# Patient Record
Sex: Male | Born: 1950 | Race: Black or African American | Hispanic: No | Smoking: Never smoker
Health system: Southern US, Community
[De-identification: ages and names within clinical notes are randomized; demographics above are authoritative.]

## PROBLEM LIST (undated history)

## (undated) DIAGNOSIS — H269 Unspecified cataract: Secondary | ICD-10-CM

## (undated) DIAGNOSIS — A048 Other specified bacterial intestinal infections: Secondary | ICD-10-CM

## (undated) DIAGNOSIS — H35039 Hypertensive retinopathy, unspecified eye: Secondary | ICD-10-CM

## (undated) DIAGNOSIS — K219 Gastro-esophageal reflux disease without esophagitis: Secondary | ICD-10-CM

## (undated) DIAGNOSIS — D509 Iron deficiency anemia, unspecified: Secondary | ICD-10-CM

## (undated) DIAGNOSIS — J449 Chronic obstructive pulmonary disease, unspecified: Secondary | ICD-10-CM

## (undated) DIAGNOSIS — I10 Essential (primary) hypertension: Secondary | ICD-10-CM

## (undated) DIAGNOSIS — F1990 Other psychoactive substance use, unspecified, uncomplicated: Secondary | ICD-10-CM

## (undated) DIAGNOSIS — D573 Sickle-cell trait: Secondary | ICD-10-CM

## (undated) DIAGNOSIS — D126 Benign neoplasm of colon, unspecified: Secondary | ICD-10-CM

## (undated) DIAGNOSIS — Z87442 Personal history of urinary calculi: Secondary | ICD-10-CM

## (undated) HISTORY — PX: ADENOIDECTOMY: SUR15

## (undated) HISTORY — DX: Hypertensive retinopathy, unspecified eye: H35.039

## (undated) HISTORY — DX: Essential (primary) hypertension: I10

## (undated) HISTORY — DX: Sickle-cell trait: D57.3

## (undated) HISTORY — DX: Benign neoplasm of colon, unspecified: D12.6

## (undated) HISTORY — DX: Other psychoactive substance use, unspecified, uncomplicated: F19.90

## (undated) HISTORY — PX: TONSILLECTOMY: SUR1361

## (undated) HISTORY — DX: Unspecified cataract: H26.9

## (undated) HISTORY — PX: CIRCUMCISION: SUR203

## (undated) HISTORY — DX: Iron deficiency anemia, unspecified: D50.9

## (undated) HISTORY — DX: Other specified bacterial intestinal infections: A04.8

---

## 2007-07-21 DIAGNOSIS — I10 Essential (primary) hypertension: Secondary | ICD-10-CM

## 2007-07-21 HISTORY — DX: Essential (primary) hypertension: I10

## 2007-10-25 ENCOUNTER — Encounter (INDEPENDENT_AMBULATORY_CARE_PROVIDER_SITE_OTHER): Payer: Self-pay | Admitting: *Deleted

## 2007-10-25 ENCOUNTER — Ambulatory Visit: Payer: Self-pay | Admitting: Family Medicine

## 2007-10-25 LAB — CONVERTED CEMR LAB
PSA: 0.59 ng/mL
PSA: 0.59 ng/mL

## 2007-10-31 ENCOUNTER — Encounter (INDEPENDENT_AMBULATORY_CARE_PROVIDER_SITE_OTHER): Payer: Self-pay | Admitting: *Deleted

## 2007-10-31 DIAGNOSIS — D573 Sickle-cell trait: Secondary | ICD-10-CM | POA: Insufficient documentation

## 2007-10-31 DIAGNOSIS — I10 Essential (primary) hypertension: Secondary | ICD-10-CM | POA: Insufficient documentation

## 2008-05-08 ENCOUNTER — Ambulatory Visit: Payer: Self-pay | Admitting: Family Medicine

## 2008-08-28 ENCOUNTER — Telehealth: Payer: Self-pay | Admitting: Family Medicine

## 2008-11-02 ENCOUNTER — Encounter: Payer: Self-pay | Admitting: Family Medicine

## 2008-11-05 LAB — CONVERTED CEMR LAB
BUN: 18 mg/dL (ref 6–23)
Basophils Absolute: 0 10*3/uL (ref 0.0–0.1)
Basophils Relative: 0 % (ref 0–1)
CO2: 23 meq/L (ref 19–32)
Calcium: 8.4 mg/dL (ref 8.4–10.5)
Chloride: 111 meq/L (ref 96–112)
Cholesterol: 113 mg/dL (ref 0–200)
Creatinine, Ser: 1.39 mg/dL (ref 0.40–1.50)
Eosinophils Absolute: 0.2 10*3/uL (ref 0.0–0.7)
Eosinophils Relative: 3 % (ref 0–5)
Glucose, Bld: 100 mg/dL — ABNORMAL HIGH (ref 70–99)
HCT: 33.5 % — ABNORMAL LOW (ref 39.0–52.0)
HDL: 40 mg/dL (ref >39)
Hemoglobin: 11.3 g/dL — ABNORMAL LOW (ref 13.0–17.0)
LDL Cholesterol: 58 mg/dL (ref 0–99)
Lymphocytes Relative: 24 % (ref 12–46)
Lymphs Abs: 1.6 10*3/uL (ref 0.7–4.0)
MCHC: 33.7 g/dL (ref 30.0–36.0)
MCV: 95.4 fL (ref 78.0–100.0)
Monocytes Absolute: 0.8 10*3/uL (ref 0.1–1.0)
Monocytes Relative: 13 % — ABNORMAL HIGH (ref 3–12)
Neutro Abs: 3.9 10*3/uL (ref 1.7–7.7)
Neutrophils Relative %: 60 % (ref 43–77)
PSA: 0.59 ng/mL (ref 0.10–4.00)
Platelets: 180 10*3/uL (ref 150–400)
Potassium: 3.6 meq/L (ref 3.5–5.3)
RBC: 3.51 M/uL — ABNORMAL LOW (ref 4.22–5.81)
RDW: 13.1 % (ref 11.5–15.5)
Sodium: 144 meq/L (ref 135–145)
Total CHOL/HDL Ratio: 2.8
Triglycerides: 76 mg/dL (ref <150)
VLDL: 15 mg/dL (ref 0–40)
WBC: 6.6 10*3/uL (ref 4.0–10.5)

## 2008-12-03 ENCOUNTER — Ambulatory Visit: Payer: Self-pay | Admitting: Family Medicine

## 2008-12-03 DIAGNOSIS — K921 Melena: Secondary | ICD-10-CM

## 2008-12-03 DIAGNOSIS — D649 Anemia, unspecified: Secondary | ICD-10-CM

## 2009-01-22 ENCOUNTER — Telehealth: Payer: Self-pay | Admitting: Family Medicine

## 2009-01-23 ENCOUNTER — Encounter (INDEPENDENT_AMBULATORY_CARE_PROVIDER_SITE_OTHER): Payer: Self-pay | Admitting: *Deleted

## 2009-02-17 HISTORY — PX: COLONOSCOPY: SHX174

## 2009-03-13 ENCOUNTER — Ambulatory Visit: Payer: Self-pay | Admitting: Gastroenterology

## 2009-03-13 DIAGNOSIS — K625 Hemorrhage of anus and rectum: Secondary | ICD-10-CM | POA: Insufficient documentation

## 2009-03-18 LAB — CONVERTED CEMR LAB
Basophils Absolute: 0 10*3/uL (ref 0.0–0.1)
Basophils Relative: 1 % (ref 0–1)
Eosinophils Absolute: 0.1 10*3/uL (ref 0.0–0.7)
Eosinophils Relative: 2 % (ref 0–5)
Ferritin: 13 ng/mL — ABNORMAL LOW (ref 22–322)
HCT: 34.5 % — ABNORMAL LOW (ref 39.0–52.0)
Hemoglobin: 11.2 g/dL — ABNORMAL LOW (ref 13.0–17.0)
Lymphocytes Relative: 37 % (ref 12–46)
Lymphs Abs: 2 10*3/uL (ref 0.7–4.0)
MCHC: 32.5 g/dL (ref 30.0–36.0)
MCV: 97.5 fL (ref 78.0–100.0)
Monocytes Absolute: 0.4 10*3/uL (ref 0.1–1.0)
Monocytes Relative: 8 % (ref 3–12)
Neutro Abs: 2.8 10*3/uL (ref 1.7–7.7)
Neutrophils Relative %: 52 % (ref 43–77)
Platelets: 178 10*3/uL (ref 150–400)
RBC: 3.54 M/uL — ABNORMAL LOW (ref 4.22–5.81)
RDW: 13.3 % (ref 11.5–15.5)
WBC: 5.3 10*3/uL (ref 4.0–10.5)

## 2009-03-19 ENCOUNTER — Ambulatory Visit: Payer: Self-pay | Admitting: Gastroenterology

## 2009-03-19 ENCOUNTER — Encounter: Payer: Self-pay | Admitting: Gastroenterology

## 2009-03-19 ENCOUNTER — Encounter: Payer: Self-pay | Admitting: Family Medicine

## 2009-03-19 ENCOUNTER — Ambulatory Visit (HOSPITAL_COMMUNITY): Admission: RE | Admit: 2009-03-19 | Discharge: 2009-03-19 | Payer: Self-pay | Admitting: Gastroenterology

## 2009-05-15 ENCOUNTER — Ambulatory Visit: Payer: Self-pay | Admitting: Gastroenterology

## 2009-05-15 DIAGNOSIS — D126 Benign neoplasm of colon, unspecified: Secondary | ICD-10-CM | POA: Insufficient documentation

## 2009-05-17 DIAGNOSIS — D509 Iron deficiency anemia, unspecified: Secondary | ICD-10-CM

## 2009-05-29 ENCOUNTER — Encounter: Payer: Self-pay | Admitting: Gastroenterology

## 2009-07-29 ENCOUNTER — Ambulatory Visit: Payer: Self-pay | Admitting: Family Medicine

## 2009-08-20 ENCOUNTER — Ambulatory Visit: Payer: Self-pay | Admitting: Gastroenterology

## 2009-08-20 DIAGNOSIS — A048 Other specified bacterial intestinal infections: Secondary | ICD-10-CM

## 2009-08-20 HISTORY — PX: UPPER GASTROINTESTINAL ENDOSCOPY: SHX188

## 2009-08-20 HISTORY — DX: Other specified bacterial intestinal infections: A04.8

## 2009-08-22 ENCOUNTER — Encounter: Payer: Self-pay | Admitting: Gastroenterology

## 2009-08-26 ENCOUNTER — Encounter: Payer: Self-pay | Admitting: Gastroenterology

## 2009-08-26 LAB — CONVERTED CEMR LAB
Ferritin: 15 ng/mL — ABNORMAL LOW (ref 22–322)
HCT: 33.4 % — ABNORMAL LOW (ref 39.0–52.0)
Hemoglobin: 11.3 g/dL — ABNORMAL LOW (ref 13.0–17.0)

## 2009-09-05 ENCOUNTER — Ambulatory Visit: Payer: Self-pay | Admitting: Gastroenterology

## 2009-09-05 ENCOUNTER — Ambulatory Visit (HOSPITAL_COMMUNITY): Admission: RE | Admit: 2009-09-05 | Discharge: 2009-09-05 | Payer: Self-pay | Admitting: Gastroenterology

## 2010-01-21 ENCOUNTER — Ambulatory Visit: Payer: Self-pay | Admitting: Gastroenterology

## 2010-01-21 DIAGNOSIS — K2970 Gastritis, unspecified, without bleeding: Secondary | ICD-10-CM

## 2010-01-21 DIAGNOSIS — B9681 Helicobacter pylori [H. pylori] as the cause of diseases classified elsewhere: Secondary | ICD-10-CM | POA: Insufficient documentation

## 2010-01-22 ENCOUNTER — Ambulatory Visit: Payer: Self-pay | Admitting: Family Medicine

## 2010-01-23 ENCOUNTER — Encounter: Payer: Self-pay | Admitting: Family Medicine

## 2010-01-25 ENCOUNTER — Encounter: Payer: Self-pay | Admitting: Gastroenterology

## 2010-02-10 ENCOUNTER — Encounter: Payer: Self-pay | Admitting: Gastroenterology

## 2010-02-10 LAB — CONVERTED CEMR LAB
BUN: 18 mg/dL (ref 6–23)
CO2: 26 meq/L (ref 19–32)
Chloride: 107 meq/L (ref 96–112)
Creatinine, Ser: 1.44 mg/dL (ref 0.40–1.50)
Ferritin: 43 ng/mL (ref 22–322)
Glucose, Bld: 96 mg/dL (ref 70–99)
LDL Cholesterol: 67 mg/dL (ref 0–99)
PSA: 0.69 ng/mL (ref 0.10–4.00)
Potassium: 3.8 meq/L (ref 3.5–5.3)
TSH: 1.629 microintl units/mL (ref 0.350–4.500)
VLDL: 13 mg/dL (ref 0–40)

## 2010-05-07 ENCOUNTER — Encounter (INDEPENDENT_AMBULATORY_CARE_PROVIDER_SITE_OTHER): Payer: Self-pay

## 2010-05-09 ENCOUNTER — Encounter: Payer: Self-pay | Admitting: Gastroenterology

## 2010-06-10 ENCOUNTER — Encounter: Payer: Self-pay | Admitting: Gastroenterology

## 2010-06-16 ENCOUNTER — Ambulatory Visit: Payer: Self-pay | Admitting: Gastroenterology

## 2010-06-17 ENCOUNTER — Encounter: Payer: Self-pay | Admitting: Gastroenterology

## 2010-07-17 ENCOUNTER — Ambulatory Visit: Payer: Self-pay | Admitting: Family Medicine

## 2010-07-18 DIAGNOSIS — M542 Cervicalgia: Secondary | ICD-10-CM | POA: Insufficient documentation

## 2010-07-24 ENCOUNTER — Encounter: Payer: Self-pay | Admitting: Gastroenterology

## 2010-08-05 ENCOUNTER — Encounter: Payer: Self-pay | Admitting: Family Medicine

## 2010-08-05 LAB — CONVERTED CEMR LAB
BUN: 17 mg/dL (ref 6–23)
CO2: 31 meq/L (ref 19–32)
Calcium: 8.7 mg/dL (ref 8.4–10.5)
Creatinine, Ser: 1.37 mg/dL (ref 0.40–1.50)
Glucose, Bld: 102 mg/dL — ABNORMAL HIGH (ref 70–99)
Sodium: 143 meq/L (ref 135–145)

## 2010-08-19 NOTE — Assessment & Plan Note (Signed)
Summary: hospital follow up - cdg   Visit Type:  f/u Primary Care Provider:  Lodema Hong  Chief Complaint:  f/u anemia.  History of Present Illness: Patient is here for f/u. Denies abd pain, constipation, diarrhea, n/v, heartburn, melena, brbpr. He feels well. He is due for H/H, ferritin. He completed treatment for H.Pylori back in 2/11.   Current Medications (verified): 1)  Amlodipine Besylate 10 Mg  Tabs (Amlodipine Besylate) .... One Tab By Mouth Once Daily 2)  Maxzide-25 37.5-25 Mg  Tabs (Triamterene-Hctz) .... One Tab By Mouth Once Daily 3)  Mvi With Iron .... One By Mouth Every Other Day 4)  Omeprazole 20 Mg Cpdr (Omeprazole) .... 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 He had multiple erosions which had evidence of active oozing. Biopsies - H. pylori gastritis. Patchy erythema in the duodenal bulb and second portion of the duodenum.  Review of Systems      See HPI  Vital Signs:  Patient profile:   60 year old male Height:      73 inches Weight:      170 pounds BMI:     22.51 Temp:     97.6 degrees F oral Pulse rate:   60 / minute BP sitting:   108 / 72  (left arm) Cuff size:   regular  Vitals Entered By: Hendricks Limes LPN (January 21, 1609 1:13 PM)  Physical Exam  General:  Well developed, well nourished, no acute distress. Eyes:  sclera nonicteric Mouth:  op moist Abdomen:  Bowel sounds normal.  Abdomen is soft, nontender, nondistended.  No rebound or guarding.  No hepatosplenomegaly, masses or hernias.  No abdominal bruits.  Extremities:  No clubbing, cyanosis, edema or deformities noted. Neurologic:  Alert and  oriented x4;  grossly normal neurologically. Skin:  Intact without significant  lesions or rashes. Psych:  Alert and cooperative. Normal mood and affect.  Impression & Recommendations:  Problem # 1:  ANEMIA, IRON DEFICIENCY (ICD-280.9) Feels well. Due for labs. Hopefully will see improvement in numbers now that H.Pylori gastritis treated. Orders: T-Ferritin ([id number]) T-Hemoglobin and Hematocrit (1005) Est. Patient Level II (19147)

## 2010-08-19 NOTE — Assessment & Plan Note (Signed)
Summary: DROPPED OFF STOOL/SS  pt returned ifobt and it was negative  Allergies: No Known Drug Allergies  Other Orders: Immuno-chemical Fecal Occult (52841)  Appended Document: DROPPED OFF STOOL/SS heme neg h/h, ferritin in 4 months  Appended Document: DROPPED OFF STOOL/SS LMOM to call. Lab order on file for 10/16/2010.  Appended Document: DROPPED OFF STOOL/SS Pt informed.

## 2010-08-19 NOTE — Assessment & Plan Note (Signed)
Summary: FU OV IN 3 MONTHS,ANEMIA,HEME + STOOLS/SS   Visit Type:  Follow-up Visit Primary Care Provider:  Lodema Hong  Chief Complaint:  F/U anemia.  History of Present Illness: [name] is here for 3 month f/u. Appetite good. No abd pain, n/v, gerd, constipation, diarrhea, melena, brbpr. C/O nausea and gi upset on Nu-iron. Stopped after three days. Taking "stress" vitamin with iron supplement.    Current Medications (verified): 1)  Amlodipine Besylate 10 Mg  Tabs (Amlodipine Besylate) .... One Tab By Mouth Once Daily 2)  Maxzide-25 37.5-25 Mg  Tabs (Triamterene-Hctz) .... One Tab By Mouth Once Daily 3)  Mvi With Iron .... One By Mouth Every Other Day  Allergies (verified): No Known Drug Allergies  Review of Systems      See HPI  Vital Signs:  Patient profile:   60 year old male Height:      73 inches Weight:      179 pounds BMI:     23.70 Temp:     98.3 degrees F oral Pulse rate:   56 / minute BP sitting:   110 / 70  (left arm) Cuff size:   regular  Vitals Entered By: Cloria Spring LPN (August 20, 2009 1:15 PM)  Physical Exam  General:  Well developed, well nourished, no acute distress. Head:  Normocephalic and atraumatic. Eyes:  Sclera nonicteric. Mouth:  OP moist. Lungs:  Clear throughout to auscultation. Heart:  Regular rate and rhythm; no murmurs, rubs,  or bruits. Abdomen:  Bowel sounds normal.  Abdomen is soft, nontender, nondistended.  No rebound or guarding.  No hepatosplenomegaly, masses or hernias.  No abdominal bruits.  Extremities:  No clubbing, cyanosis, edema or deformities noted. Neurologic:  Alert and  oriented x4;  grossly normal neurologically. Skin:  Intact without significant lesions or rashes. Psych:  Alert and cooperative. Normal mood and affect.  Impression & Recommendations:  Problem # 1:  ANEMIA, IRON DEFICIENCY (ICD-280.9) Due for three month labs. Denies overt GI bleeding. Further recommendation to follow labs.  Orders: T-Ferritin  ([id number]) T-Hemoglobin and Hematocrit (1005) Est. Patient Level II (91478)  Problem # 2:  TUBULOVILLOUS ADENOMA, COLON (ICD-211.3)  Surveillance TCS 02/2012.  Orders: Est. Patient Level II (29562)

## 2010-08-19 NOTE — Miscellaneous (Signed)
Summary: Orders Update  Clinical Lists Changes  Orders: Added new Test order of T-Hemoglobin and Hematocrit (1005) - Signed Added new Test order of T-Ferritin [phone]) - Signed

## 2010-08-19 NOTE — Assessment & Plan Note (Signed)
Summary: OV   Vital Signs:  Patient profile:   60 year old male Height:      72 inches Weight:      178 pounds BMI:     24.23 O2 Sat:      99 % Pulse rate:   67 / minute Pulse rhythm:   regular Resp:     16 per minute BP sitting:   120 / 78  (left arm) Cuff size:   regular  Vitals Entered By: Everitt Amber (July 29, 2009 3:34 PM) CC: Follow up chronic problems, states he blacked out about 3 months ago from where he got too hot on the job Is Patient Diabetic? No   Primary Care Provider:  Lodema Hong, M.D.  CC:  Follow up chronic problems and states he blacked out about 3 months ago from where he got too hot on the job.  History of Present Illness: Reports  that he has been are doing well. Denies recent fever or chills. Denies sinus pressure, nasal congestion , ear pain or sore throat. Denies chest congestion, or cough productive of sputum. Denies chest pain, palpitations, PND, orthopnea or leg swelling. Denies abdominal pain, nausea, vomitting, diarrhea or constipation. Denies change in bowel movements or bloody stool. Denies dysuria , frequency, incontinence or hesitancy. Denies  joint swelling,  Denies headaches, vertigo, seizures. Denies depression, anxiety or insomnia. Denies  rash, lesions, or itch. Has been diagnosed with colon polyps and needs rept colonscopy in2013, Dr. Darrick Penna is following his HB , states he stopped the iron since it made him feelbadly.     Current Medications (verified): 1)  Amlodipine Besylate 10 Mg  Tabs (Amlodipine Besylate) .... One Tab By Mouth Once Daily 2)  Maxzide-25 37.5-25 Mg  Tabs (Triamterene-Hctz) .... One Tab By Mouth Once Daily  Allergies (verified): No Known Drug Allergies  Review of Systems      See HPI Eyes:  Denies blurring and discharge. CV:  Complains of near fainting; pt reports tha tin October  he had a syncopal event on the job, witnessed by coworkers in [place], when they got ready to call 911 he told them he was ok,  less thn 2 mins. MS:  Complains of joint pain and stiffness; left neck stiffness persits following a fall approx 2 years ago, this was thefirst time he found out his BP was high. Neuro:  Denies headaches, seizures, and sensation of room spinning. Heme:  Denies abnormal bruising and bleeding. Allergy:  Denies hives or rash and sneezing.  Physical Exam  General:  Well-developed,well-nourished,in no acute distress; alert,appropriate and cooperative throughout examination HEENT: No facial asymmetry,  EOMI, No sinus tenderness, TM's Clear, oropharynx  pink and moist. decreased rOM neck with left trapezius spasm  Chest: Clear to auscultation bilaterally.  CVS: S1, S2, No murmurs, No S3.   Abd: Soft, Nontender.  MS: Adequate ROM spine, hips, shoulders and knees.  Ext: No edema.   CNS: CN 2-12 intact, power tone and sensation normal throughout.   Skin: Intact, no visible lesions or rashes.  Psych: Good eye contact, normal affect.  Memory intact, not anxious or depressed appearing.    Impression & Recommendations:  Problem # 1:  TUBULOVILLOUS ADENOMA, COLON (ICD-211.3) Assessment Comment Only close surveillance by GI  Problem # 2:  HYPERTENSION (ICD-401.9) Assessment: Improved  His updated medication list for this problem includes:    Amlodipine Besylate 10 Mg Tabs (Amlodipine besylate) ..... One tab by mouth once daily    Maxzide-25 37.5-25 Mg Tabs (  Triamterene-hctz) ..... One tab by mouth once daily  Orders: T-Basic Metabolic Panel ([id number])  BP today: 120/78 Prior BP: 100/70 (05/15/2009)  Labs Reviewed: K+: 3.6 (11/02/2008) Creat: : 1.39 (11/02/2008)   Chol: 113 (11/02/2008)   HDL: 40 (11/02/2008)   LDL: 58 (11/02/2008)   TG: 76 (11/02/2008)  Complete Medication List: 1)  Amlodipine Besylate 10 Mg Tabs (Amlodipine besylate) .... One tab by mouth once daily 2)  Maxzide-25 37.5-25 Mg Tabs (Triamterene-hctz) .... One tab by mouth once daily  Other Orders: T-PSA  ([id number])  Patient Instructions: 1)  f/U in 5.5 months 2)  BMP prior to visit, ICD-9: 3)  PSA prior to visit, ICD-9:  fasting april 15 or after 4)  bP today is excellent, no med changes Prescriptions: MAXZIDE-25 37.5-25 MG  TABS (TRIAMTERENE-HCTZ) one tab by mouth once daily  #30 x 6   Entered by:   Everitt Amber   Authorized by:   [name] MD   Signed by:   Everitt Amber on 07/29/2009   Method used:   Electronically to        [facility] [phone]* (retail)       [address]       [place], [place]  [address]       Ph: [phone] or [phone]       Fax: [phone]   RxID:   [id number] AMLODIPINE BESYLATE 10 MG  TABS (AMLODIPINE BESYLATE) one tab by mouth once daily  #30 x 6   Entered by:   Everitt Amber   Authorized by:   [name] MD   Signed by:   Everitt Amber on 07/29/2009   Method used:   Electronically to        [facility] [phone]* (retail)       [address]       [place], [place]  [address]       Ph: [phone] or [phone]       Fax: [phone]   RxID:   [id number]

## 2010-08-19 NOTE — Miscellaneous (Signed)
Summary: refill  Clinical Lists Changes  Medications: Rx of AMLODIPINE BESYLATE 10 MG  TABS (AMLODIPINE BESYLATE) one tab by mouth once daily;  #30 x 0;  Signed;  Entered by: Worthy Keeler LPN;  Authorized by: [name] MD;  Method used: Electronically to [facility] [phone]*, [address] [place], [place], [place]  [address], Ph: [phone] or [phone], Fax: [phone] Rx of MAXZIDE-25 37.5-25 MG  TABS (TRIAMTERENE-HCTZ) one tab by mouth once daily;  #30 x 0;  Signed;  Entered by: Worthy Keeler LPN;  Authorized by: [name] MD;  Method used: Electronically to [facility]. [phone]*, [address] [place], [place], [place]  [address], Ph: [phone] or [phone], Fax: [phone]    Prescriptions: MAXZIDE-25 37.5-25 MG  TABS (TRIAMTERENE-HCTZ) one tab by mouth once daily  #30 x 0   Entered by:   Worthy Keeler LPN   Authorized by:   [name] MD   Signed by:   Worthy Keeler LPN on 78/58/8502   Method used:   Electronically to        [facility] [phone]* (retail)       [address]       [place], [place]  [address]       Ph: [phone] or [phone]       Fax: [phone]   RxID:   [id number] AMLODIPINE BESYLATE 10 MG  TABS (AMLODIPINE BESYLATE) one tab by mouth once daily  #30 x 0   Entered by:   Worthy Keeler LPN   Authorized by:   [name] MD   Signed by:   Worthy Keeler LPN on 49/67/5916   Method used:   Electronically to        [facility] [phone]* (retail)       [address]       [place], [place]  [address]       Ph: [phone] or [phone]       Fax: [phone]   RxID:   [id number]

## 2010-08-19 NOTE — Letter (Signed)
Summary: INDIGENT PT ACKNOWLEDGEMENT  INDIGENT PT ACKNOWLEDGEMENT   Imported By: Rexene Alberts 05/09/2010 09:06:18  _____________________________________________________________________  External Attachment:    Type:   Image     Comment:   External Document

## 2010-08-19 NOTE — Letter (Addendum)
Summary: EGD ORDER  EGD ORDER   Imported By: Ave Filter 08/26/2009 11:54:25  _____________________________________________________________________  External Attachments:     1. Type:   Image          Comment:   External Document    2. Type:   Image          Comment:   External Document    3. Type:   Image          Comment:   External Document    4. Type:   Image          Comment:   External Document    5. Type:   Image          Comment:   External Document

## 2010-08-19 NOTE — Letter (Signed)
Summary: indegent form  indegent form   Imported By: Hendricks Limes LPN 16/04/9603 54:09:81  _____________________________________________________________________  External Attachment:    Type:   Image     Comment:   External Document

## 2010-08-19 NOTE — Letter (Signed)
Summary: Recall, Labs Needed  [facility]  [address]   [place], [place] [address]   Phone: [phone]  Fax: [phone]    May 07, 2010  [facility] [address] [place], [place]  [address] 10-05-1950   Dear Mr. CANTER,   Our records indicate it is time to repeat your blood work.  You can take the enclosed form to the lab on or near the date indicated.  Please make note of the new location of the lab:   [address], 2nd floor   [facility]  Our office will call you within a week to ten business days with the results.  If you do not hear from [place] in 10 business days, you should call the office.  If you have any questions regarding this, call the office at [phone], and ask for the nurse.  Labs are due on 06/09/2010.   Sincerely,    Hendricks Limes LPN  [facility] Ph: [phone]   Fax: [phone]
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
## 2010-08-19 NOTE — Miscellaneous (Signed)
Summary: Orders Update  Clinical Lists Changes  Orders: Added new Test order of T-Hemoglobin and Hematocrit (1005) - Signed Added new Test order of T-Ferritin ([id number]) - Signed 

## 2010-08-21 NOTE — Assessment & Plan Note (Signed)
Summary: follow up 5 months and 3 weeks/slj   Vital Signs:  Patient profile:   60 year old male Height:      73 inches Weight:      178 pounds BMI:     23.57 O2 Sat:      98 % on Room air Pulse rate:   70 / minute Pulse rhythm:   regular Resp:     16 per minute BP sitting:   120 / 70  (left arm)  Vitals Entered By: Adella Hare LPN (July 17, 2010 1:19 PM)  O2 Flow:  Room air CC: follow-up visit Is Patient Diabetic? No   Primary Care Provider:  Lodema Hong  CC:  follow-up visit.  History of Present Illness: Reports  that he has been doing well. Denies recent fever or chills. Denies sinus pressure, nasal congestion , ear pain or sore throat. Denies chest congestion, or cough productive of sputum. Denies chest pain, palpitations, PND, orthopnea or leg swelling.reports syncopal episodes when he eats excessively. Denies abdominal pain, nausea, vomitting, diarrhea or constipation. Denies change in bowel movements or bloody stool. Denies dysuria , frequency, incontinence or hesitancy. c/o stiff neck,which is chronic, has had x ray, refuses therapy, otherwise no joint pains, swelling, or reduced mobility. Denies headaches, vertigo, seizures. Denies depression, anxiety or insomnia. Denies  rash, lesions, or itch.     Current Medications (verified): 1)  Amlodipine Besylate 10 Mg  Tabs (Amlodipine Besylate) .... One Tab By Mouth Once Daily 2)  Maxzide-25 37.5-25 Mg  Tabs (Triamterene-Hctz) .... One Tab By Mouth Once Daily 3)  Omeprazole 20 Mg Cpdr (Omeprazole) .... Once Daily 4)  Feosol 200 (65 Fe) Mg Tabs (Ferrous Sulfate Dried) .... Take 1 Tablet By Mouth Once A Day  Allergies (verified): No Known Drug Allergies  Review of Systems Endo:  Denies cold intolerance, excessive hunger, excessive thirst, excessive urination, and heat intolerance. Heme:  Denies abnormal bruising and bleeding. Allergy:  Denies hives or rash and itching eyes.  Physical Exam  General:   Well-developed,well-nourished,in no acute distress; alert,appropriate and cooperative throughout examination HEENT: No facial asymmetry,  EOMI, No sinus tenderness, TM's Clear, oropharynx  pink and moist. decreased rOM cervical spine  Chest: Clear to auscultation bilaterally.  CVS: S1, S2, No murmurs, No S3.   Abd: Soft, Nontender.  MS: Adequate ROM spine, hips, shoulders and knees.  Ext: No edema.   CNS: CN 2-12 intact, power tone and sensation normal throughout.   Skin: Intact, no visible lesions or rashes.  Psych: Good eye contact, normal affect.  Memory intact, not anxious or depressed appearing.    Impression & Recommendations:  Problem # 1:  TUBULOVILLOUS ADENOMA, COLON (ICD-211.3) Assessment Comment Only needs rept test in 2013  Problem # 2:  HYPERTENSION (ICD-401.9) Assessment: Unchanged  His updated medication list for this problem includes:    Amlodipine Besylate 10 Mg Tabs (Amlodipine besylate) ..... One tab by mouth once daily    Maxzide-25 37.5-25 Mg Tabs (Triamterene-hctz) ..... One tab by mouth once daily  Orders: T-Basic Metabolic Panel [phone])  BP today: 120/70 Prior BP: 106/72 (01/22/2010)  Labs Reviewed: K+: 3.8 (01/25/2010) Creat: : 1.44 (01/25/2010)   Chol: 128 (01/25/2010)   HDL: 48 (01/25/2010)   LDL: 67 (01/25/2010)   TG: 65 (01/25/2010)  Problem # 3:  NECK PAIN, CHRONIC (ICD-723.1) Assessment: Unchanged  Complete Medication List: 1)  Amlodipine Besylate 10 Mg Tabs (Amlodipine besylate) .... One tab by mouth once daily 2)  Maxzide-25 37.5-25 Mg Tabs (Triamterene-hctz) .Marland KitchenMarland KitchenMarland Kitchen  One tab by mouth once daily 3)  Omeprazole 20 Mg Cpdr (Omeprazole) .... Once daily 4)  Feosol 200 (65 Fe) Mg Tabs (Ferrous sulfate dried) .... Take 1 tablet by mouth once a day  Patient Instructions: 1)  Follow up appointment in 5.72months 2)  BMP prior to visit, ICD-9:  fasting in mid January 3)  Your blpod pressure is great, no med changes    Orders Added: 1)  Est.  Patient Level IV [81191] 2)  T-Basic Metabolic Panel [[id number]]

## 2010-08-21 NOTE — Medication Information (Signed)
Summary: OMEPRAZOLE 20MG   OMEPRAZOLE 20MG    Imported By: Rexene Alberts 07/24/2010 11:04:08  _____________________________________________________________________  External Attachment:    Type:   Image     Comment:   External Document  Appended Document: OMEPRAZOLE 20MG     Prescriptions: OMEPRAZOLE 20 MG CPDR (OMEPRAZOLE) once daily  #31 x 5   Entered and Authorized by:   Gerrit Halls NP   Signed by:   Gerrit Halls NP on 07/24/2010   Method used:   Faxed to ...       [address] [phone]* (retail)       [address]       [place], [place]  [address]       Ph: [phone] or [phone]       Fax: [phone]   RxID:   [id number]

## 2010-09-18 ENCOUNTER — Encounter (INDEPENDENT_AMBULATORY_CARE_PROVIDER_SITE_OTHER): Payer: Self-pay | Admitting: *Deleted

## 2010-09-25 NOTE — Letter (Signed)
Summary: Recall, Labs Needed  [facility]  [address]   [place], [place] [address]   Phone: [phone]  Fax: [phone]    September 18, 2010  [facility] Marc Ward [address] [place], [place]  [address] 1950/08/26   Dear Marc Ward,   Our records indicate it is time to repeat your blood work.  You can take the enclosed form to the lab on or near the date indicated.  Please make note of the new location of the lab:   [address], 2nd floor   [facility]  Our office will call you within a week to ten business days with the results.  If you do not hear from [place] in 10 business days, you should call the office.  If you have any questions regarding this, call the office at [phone], and ask for the nurse.  Labs are due on 16 October 2010   Sincerely,    Carolan Clines LPN  [facility] Ph: [phone]   Fax: [phone]

## 2010-10-06 ENCOUNTER — Encounter: Payer: Self-pay | Admitting: Internal Medicine

## 2010-10-16 ENCOUNTER — Other Ambulatory Visit: Payer: Self-pay | Admitting: Gastroenterology

## 2010-10-16 NOTE — Letter (Signed)
Summary: Internal Correspondence  Internal Correspondence   Imported By: Hendricks Limes LPN 13/02/6577 46:96:29  _____________________________________________________________________  External Attachment:    Type:   Image     Comment:   External Document

## 2010-10-17 NOTE — Progress Notes (Signed)
Pt is aware of his OV on 4/26 @ 11am w/SF

## 2010-10-21 ENCOUNTER — Ambulatory Visit (INDEPENDENT_AMBULATORY_CARE_PROVIDER_SITE_OTHER): Payer: Self-pay | Admitting: Internal Medicine

## 2010-10-21 DIAGNOSIS — K625 Hemorrhage of anus and rectum: Secondary | ICD-10-CM

## 2010-10-21 DIAGNOSIS — D509 Iron deficiency anemia, unspecified: Secondary | ICD-10-CM

## 2010-10-22 LAB — IFOBT (OCCULT BLOOD): IFOBT: NEGATIVE

## 2010-10-22 NOTE — Progress Notes (Signed)
Pt was informed of the negative ifobt.

## 2010-11-13 ENCOUNTER — Ambulatory Visit (INDEPENDENT_AMBULATORY_CARE_PROVIDER_SITE_OTHER): Payer: Self-pay | Admitting: Gastroenterology

## 2010-11-13 ENCOUNTER — Encounter: Payer: Self-pay | Admitting: Gastroenterology

## 2010-11-13 VITALS — BP 107/67 | HR 55 | Temp 98.0°F | Ht 72.0 in | Wt 177.0 lb

## 2010-11-13 DIAGNOSIS — D126 Benign neoplasm of colon, unspecified: Secondary | ICD-10-CM

## 2010-11-13 DIAGNOSIS — D649 Anemia, unspecified: Secondary | ICD-10-CM

## 2010-11-13 NOTE — Progress Notes (Signed)
  Subjective:    Patient ID: Marc Ward, male    DOB: 1951/05/23, 60 y.o.   MRN: [id number]  HPI Pt seen for follow up for anemia. Has sickle cell trait but continues to require oral iron. Last TCS 2010-advance polyp. No rectal bleeding, melena, or hematemesis.   Past Medical History  Diagnosis Date  . Sickle cell trait   . Hypertension 2009  . H. pylori infection FEB 2011    ABO BID x1O DAYS   Past Surgical History  Procedure Date  . Tonsillectomy   . Adenoidectomy   . Circumcision   . Colonoscopy AUG 2010    ADVANCED [place] ADENOMA, TICS,   . Upper gastrointestinal endoscopy FEB 2011    H. PYLORI      Review of Systems     Objective:   Physical Exam  Constitutional: He is oriented to person, place, and time. He appears well-developed and well-nourished. No distress.  HENT:  Head: Normocephalic and atraumatic.  Cardiovascular: Normal rate and regular rhythm.   Pulmonary/Chest: Effort normal and breath sounds normal.  Abdominal: Soft. Bowel sounds are normal. There is no tenderness.  Neurological: He is alert and oriented to person, place, and time.          Assessment & Plan:

## 2010-11-13 NOTE — Patient Instructions (Signed)
Apply for [facility] discount. Call to schedule colonoscopy after discount available. You may also need a small bowel study called capsule endoscopy. Follow up in 4 mos.

## 2010-11-19 ENCOUNTER — Encounter: Payer: Self-pay | Admitting: Gastroenterology

## 2010-11-19 DIAGNOSIS — D649 Anemia, unspecified: Secondary | ICD-10-CM | POA: Insufficient documentation

## 2010-11-19 NOTE — Assessment & Plan Note (Signed)
TCS 2012

## 2010-11-19 NOTE — Assessment & Plan Note (Signed)
In pt with Hx: advanced polyp. Pt has no insurance and asking for assistance with lab bill.  Apply for [facility] discount. Call to schedule colonoscopy after discount available. You may also need a small bowel study called capsule endoscopy. Follow up in 4 mos.

## 2010-11-20 NOTE — Progress Notes (Signed)
Reminder in epic to schedule a 4 month follow up in August 2012

## 2010-11-20 NOTE — Progress Notes (Signed)
Cc to PCP 

## 2010-12-02 NOTE — Op Note (Signed)
NAMECabell, Lazenby Nathanyal                 ACCOUNT NO.:  [id number]   MEDICAL RECORD NO.:  [id number]          PATIENT TYPE:  AMB   LOCATION:  DAY                           FACILITY:  [facility]   PHYSICIAN:  Kassie Mends, M.D.      DATE OF BIRTH:  1951-06-21   DATE OF PROCEDURE:  03/19/2009  DATE OF DISCHARGE:                               OPERATIVE REPORT   REFERRING Kiyla Ringler:  Milus Mallick. Lodema Hong, MD   PROCEDURE:  Colonoscopy with snare cautery polypectomy.   INDICATION FOR EXAM:  Mr. Yurkovich is a 60 year old male, who presents with  bright red blood per rectum.   FINDINGS:  1. An 8-mm sessile descending colon polyp removed via snare cautery.      Pedunculated 1.5-cm polyps seen in the sigmoid colon and removed      via snare cautery.  A 6-mm sessile rectal polyp removed via snare      cautery.  2. Frequent diverticula seen in the ascending colon and sigmoid colon.      Otherwise, no masses, inflammatory changes, or AVMs seen.  3. Normal retroflex view of the rectum.  4. Will call Mr. Pipe with the results of his biopsies.  If he has an      advanced adenoma, screening colonoscopy in 3 years.  If he has a      simple adenoma, screening colonoscopy in 5 years.  5. No aspirin, NSAIDs, or anticoagulation for 7 days.  6. He should follow a high-fiber diet, he was given handout on high-      fiber diet, polyps, and diverticulosis.   DIAGNOSES:  Rectal bleeding and heme-positive stool secondary to colon  polyps.   MEDICATIONS:  1. Demerol 50 mg IV.  2. Versed 4 mg IV.   PROCEDURE TECHNIQUE:  Physical exam was performed.  Informed consent was  obtained from the patient after explaining benefits, risks, and  alternatives to the procedure.  The patient was connected to the monitor  and placed in left lateral position.  Continuous oxygen was provided by  nasal cannula and IV medicine was administered through an indwelling  cannula.  After administration of sedation and rectal exam, the  patient's  rectum was intubated.  The scope was advanced under direct  visualization to the cecum.  Scope removed slowly by carefully examining  the color, texture, anatomy, and integrity mucosa on the way out.  The  patient was recovered in endoscopy and  discharged home in satisfactory  condition.   PATH:  ADVANCED SIGMOID ADENOMA. TCS IN 3 YEARS. First degree relatives  needs TCS at age 27 and then q5 years. HIGH FIBER DIET.      Kassie Mends, M.D.  Electronically Signed     SM/MEDQ  D:  03/19/2009  T:  03/19/2009  Job:  [id number]   cc:   Milus Mallick. Lodema Hong, M.D.  Fax: [phone]

## 2010-12-29 ENCOUNTER — Encounter: Payer: Self-pay | Admitting: Family Medicine

## 2011-01-01 ENCOUNTER — Encounter: Payer: Self-pay | Admitting: Family Medicine

## 2011-01-05 ENCOUNTER — Encounter: Payer: Self-pay | Admitting: Family Medicine

## 2011-01-05 ENCOUNTER — Ambulatory Visit (INDEPENDENT_AMBULATORY_CARE_PROVIDER_SITE_OTHER): Payer: Self-pay | Admitting: Family Medicine

## 2011-01-05 VITALS — BP 120/80 | HR 65 | Resp 16 | Ht 71.5 in | Wt 173.4 lb

## 2011-01-05 DIAGNOSIS — I10 Essential (primary) hypertension: Secondary | ICD-10-CM

## 2011-01-05 DIAGNOSIS — R5381 Other malaise: Secondary | ICD-10-CM

## 2011-01-05 DIAGNOSIS — D126 Benign neoplasm of colon, unspecified: Secondary | ICD-10-CM

## 2011-01-05 DIAGNOSIS — Z13220 Encounter for screening for lipoid disorders: Secondary | ICD-10-CM

## 2011-01-05 DIAGNOSIS — D649 Anemia, unspecified: Secondary | ICD-10-CM

## 2011-01-05 DIAGNOSIS — Z125 Encounter for screening for malignant neoplasm of prostate: Secondary | ICD-10-CM

## 2011-01-05 DIAGNOSIS — R5383 Other fatigue: Secondary | ICD-10-CM

## 2011-01-05 DIAGNOSIS — M542 Cervicalgia: Secondary | ICD-10-CM

## 2011-01-05 DIAGNOSIS — N529 Male erectile dysfunction, unspecified: Secondary | ICD-10-CM | POA: Insufficient documentation

## 2011-01-05 MED ORDER — TRIAMTERENE-HCTZ 37.5-25 MG PO TABS: 1.0000 | ORAL_TABLET | Freq: Every day | ORAL | Status: DC

## 2011-01-05 MED ORDER — TADALAFIL 5 MG PO TABS: 5.0000 mg | ORAL_TABLET | Freq: Every day | ORAL | Status: AC | PRN

## 2011-01-05 NOTE — Assessment & Plan Note (Signed)
unchanged

## 2011-01-05 NOTE — Patient Instructions (Signed)
CPE in 5 months.  Fasting labs on January 27, 2011 or after.  New med for Ed pls only take TWO  Tabs before intercourse, maximum use is twice per week  BP is great , keep it up.  It is important that you exercise regularly at least 30 minutes 5 times a week. If you develop chest pain, have severe difficulty breathing, or feel very tired, stop exercising immediately and seek medical attention    A healthy diet is rich in fruit, vegetables and whole grains. Poultry fish, nuts and beans are a healthy choice for protein rather then red meat. A low sodium diet and drinking 64 ounces of water daily is generally recommended. Oils and sweet should be limited. Carbohydrates especially for those who are diabetic or overweight, should be limited to 34-45 gram per meal. It is important to eat on a regular schedule, at least 3 times daily. Snacks should be primarily fruits, vegetables or nuts.

## 2011-01-05 NOTE — Assessment & Plan Note (Signed)
Followed closely by GI 

## 2011-01-05 NOTE — Assessment & Plan Note (Signed)
Controlled, no change in medication  

## 2011-01-05 NOTE — Progress Notes (Signed)
  Subjective:    Patient ID: Marc Ward, male    DOB: Feb 12, 1951, 60 y.o.   MRN: [id number]  HPI C/o stiffness in left neck with certain activities, no interes in PT.  Several month h/o pprogressive ED symptoms, wants medication The PT is here for follow up and re-evaluation of chronic medical conditions, medication management and review of recent lab and radiology data.  Preventive health is updated, specifically  Cancer screening, Osteoporosis screening and Immunization.   Questions or concerns regarding consultations or procedures which the PT has had in the interim are  addressed. The PT denies any adverse reactions to current medications since the last visit.       Review of Systems Denies recent fever or chills. Denies sinus pressure, nasal congestion, ear pain or sore throat. Denies chest congestion, productive cough or wheezing. Denies chest pains, palpitations, paroxysmal nocturnal dyspnea, orthopnea and leg swelling Denies abdominal pain, nausea, vomiting,diarrhea or constipation.  Denies rectal bleeding or change in bowel movement. Denies dysuria, frequency, hesitancy or incontinence. Denies headaches, seizure, numbness, or tingling. Denies depression, anxiety or insomnia. Denies skin break down or rash.        Objective:   Physical Exam Patient alert and oriented and in no Cardiopulmonary distress.  HEENT: No facial asymmetry, EOMI, no sinus tenderness, TM's clear, Oropharynx pink and moist.  Neck supple no adenopathy.  Chest: Clear to auscultation bilaterally.  CVS: S1, S2 no murmurs, no S3.  ABD: Soft non tender. Bowel sounds normal.  Ext: No edema  MS: Adequate ROM spine, shoulders, hips and knees.  Skin: Intact, no ulcerations or rash noted.  Psych: Good eye contact, normal affect. Memory intact not anxious or depressed appearing.  CNS: CN 2-12 intact, power, tone and sensation normal throughout.        Assessment & Plan:

## 2011-01-29 LAB — CBC WITH DIFFERENTIAL/PLATELET
Eosinophils Absolute: 0.3 10*3/uL (ref 0.0–0.7)
Eosinophils Relative: 6 % — ABNORMAL HIGH (ref 0–5)
HCT: 32 % — ABNORMAL LOW (ref 39.0–52.0)
Hemoglobin: 10.4 g/dL — ABNORMAL LOW (ref 13.0–17.0)
Lymphocytes Relative: 41 % (ref 12–46)
Lymphs Abs: 2 10*3/uL (ref 0.7–4.0)
MCH: 31.6 pg (ref 26.0–34.0)
MCV: 97.3 fL (ref 78.0–100.0)
Monocytes Relative: 7 % (ref 3–12)
RBC: 3.29 MIL/uL — ABNORMAL LOW (ref 4.22–5.81)
WBC: 5 10*3/uL (ref 4.0–10.5)

## 2011-01-29 LAB — TSH: TSH: 4.035 u[IU]/mL (ref 0.350–4.500)

## 2011-01-29 LAB — BASIC METABOLIC PANEL
CO2: 30 mEq/L (ref 19–32)
Calcium: 8.7 mg/dL (ref 8.4–10.5)
Chloride: 109 mEq/L (ref 96–112)
Creat: 1.5 mg/dL — ABNORMAL HIGH (ref 0.50–1.35)
Glucose, Bld: 87 mg/dL (ref 70–99)

## 2011-01-29 LAB — PSA: PSA: 0.67 ng/mL (ref <=4.00)

## 2011-01-29 LAB — LIPID PANEL
HDL: 40 mg/dL (ref >39)
LDL Cholesterol: 61 mg/dL (ref 0–99)
Total CHOL/HDL Ratio: 2.8 Ratio

## 2011-04-20 ENCOUNTER — Other Ambulatory Visit: Payer: Self-pay | Admitting: Family Medicine

## 2011-04-21 ENCOUNTER — Telehealth: Payer: Self-pay | Admitting: Family Medicine

## 2011-04-22 MED ORDER — AMLODIPINE BESYLATE 10 MG PO TABS: ORAL_TABLET | ORAL | Status: DC

## 2011-04-22 NOTE — Telephone Encounter (Signed)
Sent in as requested 

## 2011-05-22 ENCOUNTER — Other Ambulatory Visit: Payer: Self-pay | Admitting: Family Medicine

## 2011-06-03 ENCOUNTER — Encounter: Payer: Self-pay | Admitting: Family Medicine

## 2011-06-08 ENCOUNTER — Encounter: Payer: Self-pay | Admitting: Family Medicine

## 2011-06-08 ENCOUNTER — Ambulatory Visit (INDEPENDENT_AMBULATORY_CARE_PROVIDER_SITE_OTHER): Payer: Self-pay | Admitting: Family Medicine

## 2011-06-08 VITALS — BP 120/72 | HR 61 | Resp 16 | Ht 71.5 in | Wt 182.0 lb

## 2011-06-08 DIAGNOSIS — K921 Melena: Secondary | ICD-10-CM

## 2011-06-08 DIAGNOSIS — D509 Iron deficiency anemia, unspecified: Secondary | ICD-10-CM

## 2011-06-08 DIAGNOSIS — Z0000 Encounter for general adult medical examination without abnormal findings: Secondary | ICD-10-CM

## 2011-06-08 DIAGNOSIS — Z1211 Encounter for screening for malignant neoplasm of colon: Secondary | ICD-10-CM

## 2011-06-08 DIAGNOSIS — I10 Essential (primary) hypertension: Secondary | ICD-10-CM

## 2011-06-08 DIAGNOSIS — D126 Benign neoplasm of colon, unspecified: Secondary | ICD-10-CM

## 2011-06-08 LAB — HEMOCCULT GUIAC POC 1CARD (OFFICE): Fecal Occult Blood, POC: POSITIVE

## 2011-06-08 MED ORDER — AMLODIPINE BESYLATE 10 MG PO TABS: ORAL_TABLET | ORAL | Status: DC

## 2011-06-08 NOTE — Assessment & Plan Note (Signed)
Established diverticulosis, next colonoscopy for adenoma due 02/2012

## 2011-06-08 NOTE — Patient Instructions (Signed)
F/u in 12 months.  You will be referred for your screening colonoscopy due end August 2013. Blood pressure is excellent, no med changes   CBc and iron level asap

## 2011-06-08 NOTE — Assessment & Plan Note (Signed)
Controlled, no change in medication  

## 2011-06-08 NOTE — Progress Notes (Signed)
  Subjective:    Patient ID: Marc Ward, male    DOB: 11-18-1950, 60 y.o.   MRN: [id number]  HPI The PT is here for annual exam  and re-evaluation of chronic medical conditions, medication management and review of any available recent lab and radiology data.  Preventive health is updated, specifically  Cancer screening and Immunization.   Questions or concerns regarding consultations or procedures which the PT has had in the interim are  addressed. The PT denies any adverse reactions to current medications since the last visit.  There are no new concerns.  There are no specific complaints       Review of Systems    See HPI  Denies recent fever or chills. Denies sinus pressure, nasal congestion, ear pain or sore throat. Denies chest congestion, productive cough or wheezing. Denies chest pains, palpitations and leg swelling Denies abdominal pain, nausea, vomiting,diarrhea or constipation.   Denies dysuria, frequency, hesitancy or incontinence. Denies joint pain, swelling and limitation in mobility. Denies headaches, seizures, numbness, or tingling. Denies depression, anxiety or insomnia. Denies skin break down or rash.      Objective:   Physical Exam Pleasant well nourished male, alert and oriented x 3, in no cardio-pulmonary distress. Afebrile. HEENT No facial trauma or asymetry. Sinuses non tender. EOMI, PERTL, fundoscopic exam is negative for hemorhages or exudates. External ears normal, tympanic membranes clear. Oropharynx moist, no exudate, good dentition. Neck: supple, no adenopathy,JVD or thyromegaly.No bruits.  Chest: Clear to ascultation bilaterally.No crackles or wheezes. Non tender to palpation  Breast: No asymetry,no masses. No nipple discharge or inversion. No axillary or supraclavicular adenopathy  Cardiovascular system; Heart sounds normal,  S1 and  S2 ,no S3.  No murmur, or thrill. Apical beat not displaced Peripheral pulses  normal.  Abdomen: Soft, non tender, no organomegaly or masses. No bruits. Bowel sounds normal. No guarding, tenderness or rebound.  Rectal:  No mass. guaiac negative stool. Prostate smooth and firm  GU: Deferred  Musculoskeletal exam: Full ROM of spine, hips , shoulders and knees. No deformity ,swelling or crepitus noted. No muscle wasting or atrophy.   Neurologic: Cranial nerves 2 to 12 intact. Power, tone ,sensation and reflexes normal throughout. No disturbance in gait. No tremor.  Skin: Intact, no ulceration, erythema , scaling or rash noted. Pigmentation normal throughout  Psych; Normal mood and affect. Judgement and concentration normal         Assessment & Plan:

## 2011-06-16 ENCOUNTER — Encounter: Payer: Self-pay | Admitting: Urgent Care

## 2011-06-16 ENCOUNTER — Ambulatory Visit (INDEPENDENT_AMBULATORY_CARE_PROVIDER_SITE_OTHER): Payer: Self-pay | Admitting: Urgent Care

## 2011-06-16 DIAGNOSIS — D509 Iron deficiency anemia, unspecified: Secondary | ICD-10-CM

## 2011-06-16 DIAGNOSIS — D126 Benign neoplasm of colon, unspecified: Secondary | ICD-10-CM

## 2011-06-16 DIAGNOSIS — Z8719 Personal history of other diseases of the digestive system: Secondary | ICD-10-CM

## 2011-06-16 NOTE — Assessment & Plan Note (Addendum)
Colonoscopy for further evaluation. If nothing is found on colonoscopy, patient may require Givens capsule study.  Hold iron for 7 days prior to the procedure.

## 2011-06-16 NOTE — Progress Notes (Signed)
Cc to PCP 

## 2011-06-16 NOTE — Assessment & Plan Note (Signed)
Status post treatment

## 2011-06-16 NOTE — Assessment & Plan Note (Signed)
Marc Ward is a 60 y.o. male with history of advanced colon polyps and iron deficiency anemia. Dr. Darrick Penna has recommended colonoscopy for further surveillance. I have discussed risks & benefits which include, but are not limited to, bleeding, infection, perforation & drug reaction.  The patient agrees with this plan & written consent will be obtained.

## 2011-06-16 NOTE — Progress Notes (Signed)
Primary Care Physician:  Margaret Simpson, MD, MD Primary Gastroenterologist:  Dr. Fields  Chief Complaint  Patient presents with  . Colonoscopy    HPI:  Marc Ward is a 60 y.o. male here to set up colonoscopy. He has a history of iron deficiency and advanced colon polyp.  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  He also had a 6 mm sessile rectal polyp and an 8 mm descending colon polyp. It was advised by Dr. Fields to have a colonoscopy this year. He tells me he has labs pending for Dr Simpson.  Denies any upper GI symptoms including heartburn, indigestion, nausea, vomiting, dysphagia, odynophagia or anorexia.  Denies any lower GI symptoms including constipation, diarrhea, rectal bleeding, melena or weight loss. Denies any NSAIDs.  Past Medical History  Diagnosis Date  . Sickle cell trait   . Hypertension 2009  . H. pylori infection FEB 2011    ABO BID x1O DAYS  . IDA (iron deficiency anemia)     Past Surgical History  Procedure Date  . Tonsillectomy   . Adenoidectomy   . Circumcision   . Colonoscopy AUG 2010    ADVANCED [place] ADENOMA, TICS,   . Upper gastrointestinal endoscopy FEB 2011    H. PYLORI    Current Outpatient Prescriptions  Medication Sig Dispense Refill  . amLODipine (NORVASC) 10 MG tablet Take one tab daily  30 tablet  3  . Ferrous Sulfate Dried (FEOSOL) 200 (65 FE) MG TABS Take by mouth daily.        . triamterene-hydrochlorothiazide (MAXZIDE-25) 37.5-25 MG per tablet TAKE ONE TABLET BY MOUTH EVERY DAY  30 tablet  4  . amLODipine (NORVASC) 10 MG tablet TAKE ONE TABLET BY MOUTH EVERY DAY FOR BLOOD PRESSURE  90 tablet  3    Allergies as of 06/16/2011  . (No Known Allergies)    Family History:There is no known family history of colorectal carcinoma , liver disease, or inflammatory bowel disease.  Problem Relation Age of Onset  . Hypertension Mother   . Diabetes Father   .  Hypertension Brother     History   Social History  . Marital Status: Divorced    Spouse Name: N/A    Number of Children: 3  . Years of Education: N/A   Occupational History  . Unemployed    .     Social History Main Topics  . Smoking status: Never Smoker   . Smokeless tobacco: Never Used  . Alcohol Use: Yes     occasional drinker-2 beers/wk  . Drug Use: 1 per week    Special: Marijuana     occassional usage; couple times per week  . Sexually Active: Yes -- Male partner(s)    Birth Control/ Protection: None     girlfriend  Review of Systems: Gen: Denies any fever, chills, sweats, anorexia, fatigue, weakness, malaise, weight loss, and sleep disorder CV: Denies chest pain, angina, palpitations, syncope, orthopnea, PND, peripheral edema, and claudication. Resp: Denies dyspnea at rest, dyspnea with exercise, cough, sputum, wheezing, coughing up blood, and pleurisy. GI: Denies vomiting blood, jaundice, and fecal incontinence.   Denies dysphagia or odynophagia. GU : Denies urinary burning, blood in urine, urinary frequency, urinary hesitancy, nocturnal urination, and urinary incontinence. MS: Denies joint pain, limitation of movement, and swelling, stiffness, low back pain, extremity pain. Denies muscle weakness, cramps, atrophy.  Derm: Denies rash, itching, dry skin, hives, moles, warts, or unhealing   ulcers.  Psych: Denies depression, anxiety, memory loss, suicidal ideation, hallucinations, paranoia, and confusion. Heme: Denies bruising, bleeding, and enlarged lymph nodes.  Physical Exam: BP 105/65  Pulse 64  Temp(Src) 98.4 F (36.9 C) (Temporal)  Ht 6' (1.829 m)  Wt 182 lb 9.6 oz (82.827 kg)  BMI 24.77 kg/m2 General:   Alert,  Well-developed, well-nourished, pleasant and cooperative in NAD Head:  Normocephalic and atraumatic. Eyes:  Sclera clear, no icterus.   Conjunctiva pink. Ears:  Normal auditory acuity. Nose:  No deformity, discharge,  or lesions. Mouth:  No  deformity or lesions, oropharynx pink & moist. Neck:  Supple; no masses or thyromegaly. Lungs:  Clear throughout to auscultation.   No wheezes, crackles, or rhonchi. No acute distress. Heart:  Regular rate and rhythm; no murmurs, clicks, rubs,  or gallops. Abdomen:  Soft, nontender and nondistended. No masses, hepatosplenomegaly or hernias noted. Normal bowel sounds, without guarding, and without rebound.   Rectal:  Deferred until time of colonoscopy.   Msk:  Symmetrical without gross deformities. Normal posture. Pulses:  Normal pulses noted. Extremities:  Without clubbing or edema. Neurologic:  Alert and  oriented x4;  grossly normal neurologically. Skin:  Intact without significant lesions or rashes. Cervical Nodes:  No significant cervical adenopathy. Psych:  Alert and cooperative. Normal mood and affect.  

## 2011-06-16 NOTE — Patient Instructions (Signed)
Colonoscopy as planned Do not take your iron for one week prior to your colonoscopy

## 2011-06-17 NOTE — Progress Notes (Signed)
PMHX:  SICKLE CELL TRAIT, ADVANCED ANENIA & H. PYLORI GASTRITIS. PT NEEDS REPEAT TCS & EGD FOR GASTRIC AND DUODENAL BIOPSIES FOR HEME POS STOOL AND PERSISTENT ANEMIA. CHECK FERRITIN PRIOR TO PROCEDURE.    NO NEED FOR SCREENING TCS IN 2013 UNLESS PT HAS ADVANCED ADENOMA FOUND IN 2012.

## 2011-06-18 ENCOUNTER — Other Ambulatory Visit: Payer: Self-pay

## 2011-06-18 ENCOUNTER — Other Ambulatory Visit: Payer: Self-pay | Admitting: Urgent Care

## 2011-06-18 DIAGNOSIS — E611 Iron deficiency: Secondary | ICD-10-CM

## 2011-06-18 NOTE — Progress Notes (Signed)
Pt returned call and I informed he need the Ferritin, and order has been faxed to [facility].

## 2011-06-18 NOTE — Progress Notes (Signed)
EGD added to TCS- Selena Batten in Endo aware)

## 2011-06-18 NOTE — Progress Notes (Signed)
LMOM for pt to call, need to get some labs done. Lab order faxed to [facility].

## 2011-06-18 NOTE — Progress Notes (Signed)
Discussed recommendations per Dr. Darrick Penna with patient. He agrees with adding EGD and will go to get his ferritin this week.  Crystal, please add on EGD reason iron deficiency anemia/heme positive stool Doris, please send order to lab for ferritin Thanks

## 2011-06-20 HISTORY — PX: COLONOSCOPY WITH ESOPHAGOGASTRODUODENOSCOPY (EGD): SHX5779

## 2011-06-21 ENCOUNTER — Other Ambulatory Visit: Payer: Self-pay | Admitting: Family Medicine

## 2011-06-22 ENCOUNTER — Other Ambulatory Visit: Payer: Self-pay | Admitting: Urgent Care

## 2011-06-22 NOTE — Progress Notes (Signed)
Pt aware.

## 2011-06-23 LAB — CBC WITH DIFFERENTIAL/PLATELET
Basophils Absolute: 0 10*3/uL (ref 0.0–0.1)
Basophils Relative: 1 % (ref 0–1)
Lymphocytes Relative: 36 % (ref 12–46)
MCHC: 32.1 g/dL (ref 30.0–36.0)
Neutro Abs: 2.1 10*3/uL (ref 1.7–7.7)
Neutrophils Relative %: 47 % (ref 43–77)
Platelets: 148 10*3/uL — ABNORMAL LOW (ref 150–400)
RDW: 13.1 % (ref 11.5–15.5)
WBC: 4.4 10*3/uL (ref 4.0–10.5)

## 2011-06-23 NOTE — Progress Notes (Signed)
REVIEWED.  

## 2011-06-23 NOTE — Progress Notes (Signed)
Quick Note:  Ferritin remains low normal Patient has a procedure scheduled with [facility] Will forward ______

## 2011-06-29 ENCOUNTER — Encounter (HOSPITAL_COMMUNITY): Payer: Self-pay | Admitting: Pharmacy Technician

## 2011-07-06 ENCOUNTER — Other Ambulatory Visit: Payer: Self-pay | Admitting: General Practice

## 2011-07-06 DIAGNOSIS — D509 Iron deficiency anemia, unspecified: Secondary | ICD-10-CM

## 2011-07-06 MED ORDER — SODIUM CHLORIDE 0.45 % IV SOLN
Freq: Once | INTRAVENOUS | Status: AC
  Administered 2011-07-07: 08:00:00 via INTRAVENOUS

## 2011-07-07 ENCOUNTER — Ambulatory Visit (HOSPITAL_COMMUNITY)
Admission: RE | Admit: 2011-07-07 | Discharge: 2011-07-07 | Disposition: A | Discharge status: Home / Self Care | Payer: Self-pay | Source: Ambulatory Visit | Attending: Gastroenterology | Admitting: Gastroenterology

## 2011-07-07 ENCOUNTER — Other Ambulatory Visit: Payer: Self-pay | Admitting: Gastroenterology

## 2011-07-07 ENCOUNTER — Encounter (HOSPITAL_COMMUNITY)
Admission: RE | Disposition: A | Discharge status: Home / Self Care | Payer: Self-pay | Source: Ambulatory Visit | Attending: Gastroenterology

## 2011-07-07 ENCOUNTER — Encounter (HOSPITAL_COMMUNITY): Payer: Self-pay | Admitting: *Deleted

## 2011-07-07 DIAGNOSIS — K2970 Gastritis, unspecified, without bleeding: Secondary | ICD-10-CM

## 2011-07-07 DIAGNOSIS — K2990 Gastroduodenitis, unspecified, without bleeding: Secondary | ICD-10-CM

## 2011-07-07 DIAGNOSIS — D509 Iron deficiency anemia, unspecified: Secondary | ICD-10-CM | POA: Insufficient documentation

## 2011-07-07 DIAGNOSIS — D126 Benign neoplasm of colon, unspecified: Secondary | ICD-10-CM | POA: Insufficient documentation

## 2011-07-07 DIAGNOSIS — Z860100 Personal history of colon polyps, unspecified: Secondary | ICD-10-CM | POA: Insufficient documentation

## 2011-07-07 DIAGNOSIS — K449 Diaphragmatic hernia without obstruction or gangrene: Secondary | ICD-10-CM | POA: Insufficient documentation

## 2011-07-07 DIAGNOSIS — K648 Other hemorrhoids: Secondary | ICD-10-CM | POA: Insufficient documentation

## 2011-07-07 DIAGNOSIS — K2980 Duodenitis without bleeding: Secondary | ICD-10-CM | POA: Insufficient documentation

## 2011-07-07 DIAGNOSIS — D573 Sickle-cell trait: Secondary | ICD-10-CM | POA: Insufficient documentation

## 2011-07-07 DIAGNOSIS — K5730 Diverticulosis of large intestine without perforation or abscess without bleeding: Secondary | ICD-10-CM

## 2011-07-07 DIAGNOSIS — K2940 Chronic atrophic gastritis without bleeding: Secondary | ICD-10-CM | POA: Insufficient documentation

## 2011-07-07 SURGERY — COLONOSCOPY
Anesthesia: Moderate Sedation

## 2011-07-07 SURGERY — COLONOSCOPY, ESOPHAGOGASTRODUODENOSCOPY (EGD) AND ESOPHAGEAL DILATION (ED)
Anesthesia: Moderate Sedation

## 2011-07-07 MED ORDER — MEPERIDINE HCL 100 MG/ML IJ SOLN
INTRAMUSCULAR | Status: DC | PRN
  Administered 2011-07-07 (×2): 25 mg via INTRAVENOUS

## 2011-07-07 MED ORDER — MIDAZOLAM HCL 5 MG/5ML IJ SOLN
INTRAMUSCULAR | Status: DC | PRN
  Administered 2011-07-07 (×2): 2 mg via INTRAVENOUS

## 2011-07-07 MED ORDER — STERILE WATER FOR IRRIGATION IR SOLN
Status: DC | PRN
  Administered 2011-07-07: 08:00:00

## 2011-07-07 MED ORDER — MIDAZOLAM HCL 5 MG/5ML IJ SOLN
INTRAMUSCULAR | Status: AC
  Filled 2011-07-07: qty 5

## 2011-07-07 MED ORDER — MINERAL OIL PO OIL
TOPICAL_OIL | ORAL | Status: AC
  Filled 2011-07-07: qty 30

## 2011-07-07 MED ORDER — OMEPRAZOLE 20 MG PO CPDR: DELAYED_RELEASE_CAPSULE | ORAL | Status: DC

## 2011-07-07 MED ORDER — BUTAMBEN-TETRACAINE-BENZOCAINE 2-2-14 % EX AERO
INHALATION_SPRAY | CUTANEOUS | Status: DC | PRN
  Administered 2011-07-07: 1 via TOPICAL

## 2011-07-07 MED ORDER — MIDAZOLAM HCL 2 MG/2ML IJ SOLN
INTRAMUSCULAR | Status: AC
  Filled 2011-07-07: qty 4

## 2011-07-07 MED ORDER — MEPERIDINE HCL 100 MG/ML IJ SOLN
INTRAMUSCULAR | Status: AC
  Filled 2011-07-07: qty 2

## 2011-07-07 NOTE — Interval H&P Note (Signed)
History and Physical Interval Note:  07/07/2011 7:55 AM  Marc Ward  has presented today for surgery, with the diagnosis of IDA, heme positive stool  The various methods of treatment have been discussed with the patient and family. After consideration of risks, benefits and other options for treatment, the patient has consented to  Procedure(s): COLONOSCOPY, ESOPHAGOGASTRODUODENOSCOPY (EGD) AND ESOPHAGEAL DILATION as a surgical intervention .  The patients' history has been reviewed, patient examined, no change in status, stable for surgery.  I have reviewed the patients' chart and labs.  Questions were answered to the patient's satisfaction.     [employer]

## 2011-07-07 NOTE — Op Note (Signed)
[facility] [address] [place], [place]  [address]  ENDOSCOPY PROCEDURE REPORT  PATIENT:  Marc Ward, Marc Ward  MR#:  [id number] BIRTHDATE:  24-Jul-1950, 60 yrs. old  GENDER:  male  ENDOSCOPIST:  Jonette Eva, MD Referred by:  [name], M.D.  PROCEDURE DATE:  07/07/2011 PROCEDURE:  EGD with biopsy, [id number] ASA CLASS: INDICATIONS:  ANEMIA, HX: H PYLORI GASTRITIS, RX ABO bid for 10 days  MEDICATIONS:  TCS + None TOPICAL ANESTHETIC:  Cetacaine Spray  DESCRIPTION OF PROCEDURE:     Physical exam was performed. Informed consent was obtained from the patient after explaining the benefits, risks, and alternatives to the procedure.  The patient was connected to the monitor and placed in the left lateral position.  Continuous oxygen was provided by nasal cannula and IV medicine administered through an indwelling cannula.  After administration of sedation, the patient's esophagus was intubated and the [id number] ([id number]) endoscope was advanced under direct visualization to the second portion of the duodenum.  The scope was removed slowly by carefully examining the color, texture, anatomy, and integrity of the mucosa on the way out.  The patient was recovered in endoscopy and discharged home in satisfactory condition. <<PROCEDUREIMAGES>>  A 1-2 CM hiatal hernia was found.  Moderate gastritis was found  BIOPSIED VIA COLD FORCEPS.  MILD Duodenitis was found. 2N PORTION OF DUODENUM BIOPSIED VIA COLD FORCEPS TO EVALUATE FOR CELIAC SPRUE.  COMPLICATIONS:    None  ENDOSCOPIC IMPRESSION: 1) Hiatal hernia 2) Moderate gastritis 3) Duodenitis, MILD  RECOMMENDATIONS: DAILY PPI AWAIT BIOPSIES AVOID GASTRIC IRRITANTS OPV IN 4 MOS  REPEAT EXAM:  No  ______________________________ Jonette Eva, MD  CC:  n. eSIGNED:   Santi Troung at 07/07/2011 09:01 AM  Cleaster Corin, [phone]

## 2011-07-07 NOTE — Op Note (Signed)
[facility] [address] [place], [place]  [address]  COLONOSCOPY PROCEDURE REPORT  PATIENT:  Marc, Ward  MR#:  [id number] BIRTHDATE:  09/28/1950, 60 yrs. old  GENDER:  male  ENDOSCOPIST:  Jonette Eva, MD REF. BY:  [name], M.D. ASSISTANT:  PROCEDURE DATE:  07/07/2011 PROCEDURE:  ILEOColonoscopy with biopsy  INDICATIONS:  microcytic anemia requiring iron supplements-PT THINKS IRON PROBLEMS IS HIS RECREATIONAL ACTIVITIES. PMHx: SICKLE CELL TRAIT, ADVANCED ADENOMA 2010  MEDICATIONS:   Demerol 50 mg IV, Versed 4 mg IV  DESCRIPTION OF PROCEDURE:    Physical exam was performed. Informed consent was obtained from the patient after explaining the benefits, risks, and alternatives to procedure.  The patient was connected to monitor and placed in left lateral position. Continuous oxygen was provided by nasal cannula and IV medicine administered through an indwelling cannula.  After administration of sedation and rectal exam, the patient's rectum was intubated and the EC-[id number] ([id number]) colonoscope was advanced under direct visualization to the ILEUM.  The scope was removed slowly by carefully examining the color, texture, anatomy, and integrity mucosa on the way out.  The patient was recovered in endoscopy and discharged home in satisfactory condition. <<PROCEDUREIMAGES>>  FINDINGS:  TWO 3 MM sessile polyp wERE found at the hepatic flexur & REMOVED VIA COLD FORCEPS.  A sessile polyp was found in the cecum & REMOVED VIA COLD FORCEPS.  Scattered diverticula were found throughout the colon.  SMALL Internal Hemorrhoids were found.  PREP QUALITY: GOOD CECAL W/D TIME:   16 minutes  COMPLICATIONS:    None  ENDOSCOPIC IMPRESSION: 1) Sessile polypS at the hepatic flexure 2) Sessile polyp in the cecum 3) DiverticulOSIS, scattered throughout the colon 4) Internal hemorrhoids  RECOMMENDATIONS: TCS IN 5 YEARS IF SIMPLE ADENOMAS HIGH FIBER DIET NO OBVIOUS  SOURCE FOR FEDA FOUND, PROCEED WITH EGD  REPEAT EXAM:  No  ______________________________ Jonette Eva, MD  CC:  [name], M.D.  n. eSIGNED:   Rozelle Caudle at 07/07/2011 08:47 AM  Cleaster Corin, [phone]

## 2011-07-07 NOTE — H&P (View-Only) (Signed)
Primary Care Physician:  [name], MD, MD Primary Gastroenterologist:  Dr. Darrick Penna  Chief Complaint  Patient presents with  . Colonoscopy    HPI:  Marc Ward is a 60 y.o. male here to set up colonoscopy. He has a history of iron deficiency and advanced colon polyp.  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  He also had a 6 mm sessile rectal polyp and an 8 mm descending colon polyp. It was advised by Dr. Darrick Penna to have a colonoscopy this year. He tells me he has labs pending for Dr Lodema Hong.  Denies any upper GI symptoms including heartburn, indigestion, nausea, vomiting, dysphagia, odynophagia or anorexia.  Denies any lower GI symptoms including constipation, diarrhea, rectal bleeding, melena or weight loss. Denies any NSAIDs.  Past Medical History  Diagnosis Date  . Sickle cell trait   . Hypertension 2009  . H. pylori infection FEB 2011    ABO BID x1O DAYS  . IDA (iron deficiency anemia)     Past Surgical History  Procedure Date  . Tonsillectomy   . Adenoidectomy   . Circumcision   . Colonoscopy AUG 2010    ADVANCED [place] ADENOMA, TICS,   . Upper gastrointestinal endoscopy FEB 2011    H. PYLORI    Current Outpatient Prescriptions  Medication Sig Dispense Refill  . amLODipine (NORVASC) 10 MG tablet Take one tab daily  30 tablet  3  . Ferrous Sulfate Dried (FEOSOL) 200 (65 FE) MG TABS Take by mouth daily.        Marland Kitchen triamterene-hydrochlorothiazide (MAXZIDE-25) 37.5-25 MG per tablet TAKE ONE TABLET BY MOUTH EVERY DAY  30 tablet  4  . amLODipine (NORVASC) 10 MG tablet TAKE ONE TABLET BY MOUTH EVERY DAY FOR BLOOD PRESSURE  90 tablet  3    Allergies as of 06/16/2011  . (No Known Allergies)    Family History:There is no known family history of colorectal carcinoma , liver disease, or inflammatory bowel disease.  Problem Relation Age of Onset  . Hypertension Mother   . Diabetes Father   .  Hypertension Brother     History   Social History  . Marital Status: Divorced    Spouse Name: N/A    Number of Children: 3  . Years of Education: N/A   Occupational History  . Unemployed    .     Social History Main Topics  . Smoking status: Never Smoker   . Smokeless tobacco: Never Used  . Alcohol Use: Yes     occasional drinker-2 beers/wk  . Drug Use: 1 per week    Special: Marijuana     occassional usage; couple times per week  . Sexually Active: Yes -- Male partner(s)    Birth Control/ Protection: None     girlfriend  Review of Systems: Gen: Denies any fever, chills, sweats, anorexia, fatigue, weakness, malaise, weight loss, and sleep disorder CV: Denies chest pain, angina, palpitations, syncope, orthopnea, PND, peripheral edema, and claudication. Resp: Denies dyspnea at rest, dyspnea with exercise, cough, sputum, wheezing, coughing up blood, and pleurisy. GI: Denies vomiting blood, jaundice, and fecal incontinence.   Denies dysphagia or odynophagia. GU : Denies urinary burning, blood in urine, urinary frequency, urinary hesitancy, nocturnal urination, and urinary incontinence. MS: Denies joint pain, limitation of movement, and swelling, stiffness, low back pain, extremity pain. Denies muscle weakness, cramps, atrophy.  Derm: Denies rash, itching, dry skin, hives, moles, warts, or unhealing  ulcers.  Psych: Denies depression, anxiety, memory loss, suicidal ideation, hallucinations, paranoia, and confusion. Heme: Denies bruising, bleeding, and enlarged lymph nodes.  Physical Exam: BP 105/65  Pulse 64  Temp(Src) 98.4 F (36.9 C) (Temporal)  Ht 6' (1.829 m)  Wt 182 lb 9.6 oz (82.827 kg)  BMI 24.77 kg/m2 General:   Alert,  Well-developed, well-nourished, pleasant and cooperative in NAD Head:  Normocephalic and atraumatic. Eyes:  Sclera clear, no icterus.   Conjunctiva pink. Ears:  Normal auditory acuity. Nose:  No deformity, discharge,  or lesions. Mouth:  No  deformity or lesions, oropharynx pink & moist. Neck:  Supple; no masses or thyromegaly. Lungs:  Clear throughout to auscultation.   No wheezes, crackles, or rhonchi. No acute distress. Heart:  Regular rate and rhythm; no murmurs, clicks, rubs,  or gallops. Abdomen:  Soft, nontender and nondistended. No masses, hepatosplenomegaly or hernias noted. Normal bowel sounds, without guarding, and without rebound.   Rectal:  Deferred until time of colonoscopy.   Msk:  Symmetrical without gross deformities. Normal posture. Pulses:  Normal pulses noted. Extremities:  Without clubbing or edema. Neurologic:  Alert and  oriented x4;  grossly normal neurologically. Skin:  Intact without significant lesions or rashes. Cervical Nodes:  No significant cervical adenopathy. Psych:  Alert and cooperative. Normal mood and affect.

## 2011-07-20 ENCOUNTER — Telehealth: Payer: Self-pay | Admitting: Gastroenterology

## 2011-07-20 NOTE — Telephone Encounter (Signed)
Please call pt. His stomach Bx shows mild gastritis.  HE had a polypoid lesionS, removed and THEY ARE benign. NO OBVIOUS SOURCE FOR HIS LOW NORMAL IRON WAS IDENTIFIED. HE NEEDS A CAPSULE ENDOSCOPY BECAUSE DR/ SIMPSON DID DETECT BLOOD IN HIS STOOL. Continue OMP DAILY. OPV IN APR 2013. TCS in 5 years. High fiber diet.

## 2011-07-21 DIAGNOSIS — D126 Benign neoplasm of colon, unspecified: Secondary | ICD-10-CM

## 2011-07-21 HISTORY — DX: Benign neoplasm of colon, unspecified: D12.6

## 2011-07-22 ENCOUNTER — Other Ambulatory Visit: Payer: Self-pay | Admitting: Family Medicine

## 2011-07-22 NOTE — Telephone Encounter (Signed)
Pt informed

## 2011-07-22 NOTE — Telephone Encounter (Signed)
Reminder in epic to have tcs in 5 years °

## 2011-07-22 NOTE — Telephone Encounter (Signed)
Results Cc to PCP  

## 2011-11-05 ENCOUNTER — Ambulatory Visit: Payer: Self-pay | Admitting: Urgent Care

## 2011-11-08 ENCOUNTER — Encounter (HOSPITAL_COMMUNITY): Payer: Self-pay

## 2011-11-08 ENCOUNTER — Encounter (HOSPITAL_COMMUNITY): Payer: Self-pay | Admitting: Anesthesiology

## 2011-11-08 ENCOUNTER — Emergency Department (HOSPITAL_COMMUNITY): Payer: Medicaid Other

## 2011-11-08 ENCOUNTER — Encounter (HOSPITAL_COMMUNITY)
Admission: EM | Disposition: A | Discharge status: Home / Self Care | Payer: Self-pay | Source: Home / Self Care | Attending: General Surgery

## 2011-11-08 ENCOUNTER — Emergency Department (HOSPITAL_COMMUNITY): Payer: Medicaid Other | Admitting: Anesthesiology

## 2011-11-08 ENCOUNTER — Inpatient Hospital Stay (HOSPITAL_COMMUNITY)
Admission: EM | Admit: 2011-11-08 | Discharge: 2011-11-12 | DRG: 331 | Disposition: A | Discharge status: Home / Self Care | Payer: Medicaid Other | Attending: General Surgery | Admitting: General Surgery

## 2011-11-08 DIAGNOSIS — K561 Intussusception: Secondary | ICD-10-CM

## 2011-11-08 DIAGNOSIS — I10 Essential (primary) hypertension: Secondary | ICD-10-CM | POA: Diagnosis present

## 2011-11-08 DIAGNOSIS — D375 Neoplasm of uncertain behavior of rectum: Secondary | ICD-10-CM | POA: Diagnosis present

## 2011-11-08 DIAGNOSIS — D371 Neoplasm of uncertain behavior of stomach: Secondary | ICD-10-CM | POA: Diagnosis present

## 2011-11-08 DIAGNOSIS — R109 Unspecified abdominal pain: Secondary | ICD-10-CM | POA: Diagnosis present

## 2011-11-08 DIAGNOSIS — R112 Nausea with vomiting, unspecified: Secondary | ICD-10-CM | POA: Diagnosis present

## 2011-11-08 DIAGNOSIS — D573 Sickle-cell trait: Secondary | ICD-10-CM | POA: Diagnosis present

## 2011-11-08 HISTORY — PX: LAPAROTOMY: SHX154

## 2011-11-08 HISTORY — PX: BOWEL RESECTION: SHX1257

## 2011-11-08 LAB — CARDIAC PANEL(CRET KIN+CKTOT+MB+TROPI)
CK, MB: 2.7 ng/mL (ref 0.3–4.0)
Total CK: 169 U/L (ref 7–232)
Troponin I: <0.3 ng/mL (ref <0.30)

## 2011-11-08 LAB — CBC
Hemoglobin: 12.1 g/dL — ABNORMAL LOW (ref 13.0–17.0)
MCHC: 33.8 g/dL (ref 30.0–36.0)
RBC: 3.74 MIL/uL — ABNORMAL LOW (ref 4.22–5.81)

## 2011-11-08 LAB — URINALYSIS, ROUTINE W REFLEX MICROSCOPIC
Glucose, UA: NEGATIVE mg/dL
Hgb urine dipstick: NEGATIVE
Leukocytes, UA: NEGATIVE
Specific Gravity, Urine: <1.005 — ABNORMAL LOW (ref 1.005–1.030)
pH: 7 (ref 5.0–8.0)

## 2011-11-08 LAB — COMPREHENSIVE METABOLIC PANEL
ALT: 8 U/L (ref 0–53)
AST: 15 U/L (ref 0–37)
Albumin: 3.7 g/dL (ref 3.5–5.2)
Alkaline Phosphatase: 43 U/L (ref 39–117)
BUN: 14 mg/dL (ref 6–23)
Chloride: 102 mEq/L (ref 96–112)
Potassium: 3.1 mEq/L — ABNORMAL LOW (ref 3.5–5.1)
Sodium: 142 mEq/L (ref 135–145)
Total Bilirubin: 0.4 mg/dL (ref 0.3–1.2)

## 2011-11-08 LAB — DIFFERENTIAL
Basophils Relative: 0 % (ref 0–1)
Lymphs Abs: 0.9 10*3/uL (ref 0.7–4.0)
Monocytes Relative: 2 % — ABNORMAL LOW (ref 3–12)
Neutro Abs: 7.9 10*3/uL — ABNORMAL HIGH (ref 1.7–7.7)
Neutrophils Relative %: 87 % — ABNORMAL HIGH (ref 43–77)

## 2011-11-08 LAB — LACTIC ACID, PLASMA: Lactic Acid, Venous: 2.2 mmol/L (ref 0.5–2.2)

## 2011-11-08 LAB — LIPASE, BLOOD: Lipase: 17 U/L (ref 11–59)

## 2011-11-08 SURGERY — LAPAROTOMY, EXPLORATORY
Anesthesia: General | Site: Abdomen | Wound class: Clean Contaminated

## 2011-11-08 MED ORDER — CEFAZOLIN SODIUM-DEXTROSE 2-3 GM-% IV SOLR
2.0000 g | INTRAVENOUS | Status: AC
  Administered 2011-11-08: 2 g via INTRAVENOUS
  Filled 2011-11-08: qty 50

## 2011-11-08 MED ORDER — PROPOFOL 10 MG/ML IV EMUL
INTRAVENOUS | Status: DC | PRN
  Administered 2011-11-08: 150 mg via INTRAVENOUS

## 2011-11-08 MED ORDER — AMLODIPINE BESYLATE 5 MG PO TABS
10.0000 mg | ORAL_TABLET | Freq: Every day | ORAL | Status: DC
  Administered 2011-11-09 – 2011-11-11 (×3): 10 mg via ORAL
  Filled 2011-11-08 (×5): qty 2

## 2011-11-08 MED ORDER — ACETAMINOPHEN 10 MG/ML IV SOLN
1000.0000 mg | Freq: Four times a day (QID) | INTRAVENOUS | Status: DC
  Filled 2011-11-08 (×4): qty 100

## 2011-11-08 MED ORDER — SODIUM CHLORIDE 0.9 % IV SOLN
INTRAVENOUS | Status: DC
  Administered 2011-11-08: 04:00:00 via INTRAVENOUS

## 2011-11-08 MED ORDER — ONDANSETRON HCL 4 MG/2ML IJ SOLN: 4.0000 mg | Freq: Four times a day (QID) | INTRAMUSCULAR | Status: DC | PRN

## 2011-11-08 MED ORDER — METOCLOPRAMIDE HCL 5 MG/ML IJ SOLN
10.0000 mg | Freq: Once | INTRAMUSCULAR | Status: AC
  Administered 2011-11-08: 10 mg via INTRAVENOUS
  Filled 2011-11-08: qty 2

## 2011-11-08 MED ORDER — POTASSIUM CHLORIDE 10 MEQ/100ML IV SOLN
10.0000 meq | Freq: Once | INTRAVENOUS | Status: AC
  Administered 2011-11-08: 10 meq via INTRAVENOUS
  Filled 2011-11-08: qty 100

## 2011-11-08 MED ORDER — PANTOPRAZOLE SODIUM 40 MG PO TBEC
40.0000 mg | DELAYED_RELEASE_TABLET | Freq: Every day | ORAL | Status: DC
  Administered 2011-11-08 – 2011-11-12 (×5): 40 mg via ORAL
  Filled 2011-11-08 (×5): qty 1

## 2011-11-08 MED ORDER — LACTATED RINGERS IV SOLN
INTRAVENOUS | Status: DC | PRN
  Administered 2011-11-08: 12:00:00 via INTRAVENOUS

## 2011-11-08 MED ORDER — ENOXAPARIN SODIUM 40 MG/0.4ML ~~LOC~~ SOLN
40.0000 mg | SUBCUTANEOUS | Status: DC
  Administered 2011-11-08 – 2011-11-11 (×4): 40 mg via SUBCUTANEOUS
  Filled 2011-11-08 (×4): qty 0.4

## 2011-11-08 MED ORDER — POTASSIUM CHLORIDE CRYS ER 20 MEQ PO TBCR: 40.0000 meq | EXTENDED_RELEASE_TABLET | Freq: Once | ORAL | Status: DC

## 2011-11-08 MED ORDER — GLYCOPYRROLATE 0.2 MG/ML IJ SOLN
INTRAMUSCULAR | Status: DC | PRN
  Administered 2011-11-08: 0.4 mg via INTRAVENOUS

## 2011-11-08 MED ORDER — ALVIMOPAN 12 MG PO CAPS
12.0000 mg | ORAL_CAPSULE | Freq: Once | ORAL | Status: AC
  Administered 2011-11-08: 12 mg via ORAL
  Filled 2011-11-08: qty 1

## 2011-11-08 MED ORDER — HYDROMORPHONE HCL PF 1 MG/ML IJ SOLN
1.0000 mg | INTRAMUSCULAR | Status: DC | PRN
  Administered 2011-11-08 – 2011-11-12 (×15): 1 mg via INTRAVENOUS
  Filled 2011-11-08 (×15): qty 1

## 2011-11-08 MED ORDER — ALVIMOPAN 12 MG PO CAPS
12.0000 mg | ORAL_CAPSULE | Freq: Two times a day (BID) | ORAL | Status: DC
  Administered 2011-11-09 – 2011-11-12 (×7): 12 mg via ORAL
  Filled 2011-11-08 (×7): qty 1

## 2011-11-08 MED ORDER — HYDROMORPHONE HCL PF 1 MG/ML IJ SOLN
1.0000 mg | Freq: Once | INTRAMUSCULAR | Status: AC
  Administered 2011-11-08: 1 mg via INTRAVENOUS
  Filled 2011-11-08: qty 1

## 2011-11-08 MED ORDER — ROCURONIUM BROMIDE 100 MG/10ML IV SOLN
INTRAVENOUS | Status: DC | PRN
  Administered 2011-11-08 (×2): 5 mg via INTRAVENOUS
  Administered 2011-11-08: 25 mg via INTRAVENOUS
  Administered 2011-11-08: 5 mg via INTRAVENOUS

## 2011-11-08 MED ORDER — LIDOCAINE HCL (CARDIAC) 10 MG/ML IV SOLN
INTRAVENOUS | Status: DC | PRN
  Administered 2011-11-08: 10 mg via INTRAVENOUS

## 2011-11-08 MED ORDER — ONDANSETRON HCL 4 MG PO TABS: 4.0000 mg | ORAL_TABLET | Freq: Four times a day (QID) | ORAL | Status: DC | PRN

## 2011-11-08 MED ORDER — POVIDONE-IODINE 10 % EX OINT
TOPICAL_OINTMENT | CUTANEOUS | Status: DC | PRN
  Administered 2011-11-08: 2 via TOPICAL

## 2011-11-08 MED ORDER — ENOXAPARIN SODIUM 40 MG/0.4ML ~~LOC~~ SOLN
40.0000 mg | Freq: Once | SUBCUTANEOUS | Status: AC
  Administered 2011-11-08: 40 mg via SUBCUTANEOUS
  Filled 2011-11-08: qty 0.4

## 2011-11-08 MED ORDER — TRIAMTERENE-HCTZ 37.5-25 MG PO TABS
1.0000 | ORAL_TABLET | Freq: Every day | ORAL | Status: DC
  Administered 2011-11-09 – 2011-11-11 (×3): 1 via ORAL
  Filled 2011-11-08 (×5): qty 1

## 2011-11-08 MED ORDER — LACTATED RINGERS IV SOLN
INTRAVENOUS | Status: DC
  Administered 2011-11-08 – 2011-11-09 (×3): via INTRAVENOUS

## 2011-11-08 MED ORDER — ACETAMINOPHEN 10 MG/ML IV SOLN
1000.0000 mg | Freq: Four times a day (QID) | INTRAVENOUS | Status: AC
  Administered 2011-11-08 – 2011-11-09 (×4): 1000 mg via INTRAVENOUS
  Filled 2011-11-08 (×4): qty 100

## 2011-11-08 MED ORDER — MIDAZOLAM HCL 5 MG/5ML IJ SOLN
INTRAMUSCULAR | Status: DC | PRN
  Administered 2011-11-08: 2 mg via INTRAVENOUS

## 2011-11-08 MED ORDER — ONDANSETRON HCL 4 MG/2ML IJ SOLN
4.0000 mg | Freq: Once | INTRAMUSCULAR | Status: AC
  Administered 2011-11-08: 4 mg via INTRAVENOUS
  Filled 2011-11-08: qty 2

## 2011-11-08 MED ORDER — SUCCINYLCHOLINE CHLORIDE 20 MG/ML IJ SOLN
INTRAMUSCULAR | Status: DC | PRN
  Administered 2011-11-08: 140 mg via INTRAVENOUS

## 2011-11-08 MED ORDER — FENTANYL CITRATE 0.05 MG/ML IJ SOLN
INTRAMUSCULAR | Status: DC | PRN
  Administered 2011-11-08: 100 ug via INTRAVENOUS
  Administered 2011-11-08: 50 ug via INTRAVENOUS
  Administered 2011-11-08: 25 ug via INTRAVENOUS
  Administered 2011-11-08: 50 ug via INTRAVENOUS
  Administered 2011-11-08: 25 ug via INTRAVENOUS

## 2011-11-08 MED ORDER — IOHEXOL 300 MG/ML  SOLN
100.0000 mL | Freq: Once | INTRAMUSCULAR | Status: AC | PRN
  Administered 2011-11-08: 100 mL via INTRAVENOUS

## 2011-11-08 MED ORDER — NEOSTIGMINE METHYLSULFATE 1 MG/ML IJ SOLN
INTRAMUSCULAR | Status: DC | PRN
  Administered 2011-11-08: 2 mg via INTRAVENOUS

## 2011-11-08 MED ORDER — SODIUM CHLORIDE 0.9 % IR SOLN
Status: DC | PRN
  Administered 2011-11-08: 2000 mL

## 2011-11-08 SURGICAL SUPPLY — 57 items
APPLIER CLIP 11 MED OPEN (CLIP)
APPLIER CLIP 13 LRG OPEN (CLIP)
BAG HAMPER (MISCELLANEOUS) ×2 IMPLANT
BARRIER SKIN 2 3/4 (OSTOMY) IMPLANT
CLAMP POUCH DRAINAGE QUIET (OSTOMY) IMPLANT
CLIP APPLIE 11 MED OPEN (CLIP) IMPLANT
CLIP APPLIE 13 LRG OPEN (CLIP) IMPLANT
CLOTH BEACON ORANGE TIMEOUT ST (SAFETY) ×2 IMPLANT
COVER SURGICAL LIGHT HANDLE (MISCELLANEOUS) ×2 IMPLANT
DRAPE WARM FLUID 44X44 (DRAPE) ×2 IMPLANT
DURAPREP 26ML APPLICATOR (WOUND CARE) IMPLANT
ELECT BLADE 6 FLAT ULTRCLN (ELECTRODE) IMPLANT
ELECT REM PT RETURN 9FT ADLT (ELECTROSURGICAL) ×2
ELECTRODE REM PT RTRN 9FT ADLT (ELECTROSURGICAL) ×1 IMPLANT
GLOVE BIO SURGEON STRL SZ7.5 (GLOVE) ×2 IMPLANT
GLOVE BIOGEL PI IND STRL 7.5 (GLOVE) ×2 IMPLANT
GLOVE BIOGEL PI INDICATOR 7.5 (GLOVE) ×2
GLOVE ECLIPSE 7.0 STRL STRAW (GLOVE) ×4 IMPLANT
GLOVE EXAM NITRILE MD LF STRL (GLOVE) ×2 IMPLANT
GOWN STRL REIN XL XLG (GOWN DISPOSABLE) ×6 IMPLANT
HARMONIC SHEARS 14CM COAG (MISCELLANEOUS) IMPLANT
INST SET MAJOR GENERAL (KITS) ×2 IMPLANT
KIT REMOVER STAPLE SKIN (MISCELLANEOUS) IMPLANT
KIT ROOM TURNOVER APOR (KITS) ×2 IMPLANT
LIGASURE IMPACT 36 18CM CVD LR (INSTRUMENTS) ×2 IMPLANT
MANIFOLD NEPTUNE II (INSTRUMENTS) ×2 IMPLANT
NS IRRIG 1000ML POUR BTL (IV SOLUTION) ×4 IMPLANT
PACK ABDOMINAL MAJOR (CUSTOM PROCEDURE TRAY) ×2 IMPLANT
PAD ARMBOARD 7.5X6 YLW CONV (MISCELLANEOUS) ×2 IMPLANT
POUCH OSTOMY 2 3/4  H 3804 (WOUND CARE)
POUCH OSTOMY 2 PC DRNBL 2.75 (WOUND CARE) IMPLANT
RELOAD LINEAR CUT PROX 55 BLUE (ENDOMECHANICALS) ×4 IMPLANT
RELOAD PROXIMATE 75MM BLUE (ENDOMECHANICALS) IMPLANT
RETRACTOR WND ALEXIS 25 LRG (MISCELLANEOUS) IMPLANT
RETRACTOR WOUND ALXS 34CM XLRG (MISCELLANEOUS) IMPLANT
RTRCTR WOUND ALEXIS 25CM LRG (MISCELLANEOUS)
RTRCTR WOUND ALEXIS 34CM XLRG (MISCELLANEOUS)
SET BASIN LINEN APH (SET/KITS/TRAYS/PACK) ×2 IMPLANT
SHEARS HARMONIC 23CM COAG (MISCELLANEOUS) IMPLANT
SPONGE GAUZE 4X4 12PLY (GAUZE/BANDAGES/DRESSINGS) ×2 IMPLANT
SPONGE LAP 18X18 X RAY DECT (DISPOSABLE) ×2 IMPLANT
STAPLER GUN LINEAR PROX 60 (STAPLE) ×2 IMPLANT
STAPLER PROXIMATE 55 BLUE (STAPLE) ×2 IMPLANT
STAPLER PROXIMATE 75MM BLUE (STAPLE) IMPLANT
STAPLER VISISTAT (STAPLE) ×2 IMPLANT
SUCTION POOLE TIP (SUCTIONS) ×2 IMPLANT
SUT CHROMIC 0 SH (SUTURE) IMPLANT
SUT CHROMIC 2 0 SH (SUTURE) ×2 IMPLANT
SUT NOVA NAB GS-26 0 60 (SUTURE) IMPLANT
SUT PDS AB CT VIOLET #0 27IN (SUTURE) ×4 IMPLANT
SUT SILK 2 0 (SUTURE)
SUT SILK 2 0 REEL (SUTURE) IMPLANT
SUT SILK 2-0 18XBRD TIE 12 (SUTURE) IMPLANT
SUT SILK 3 0 SH CR/8 (SUTURE) ×2 IMPLANT
TAPE CLOTH SURG 4X10 WHT LF (GAUZE/BANDAGES/DRESSINGS) ×2 IMPLANT
TOWEL BLUE STERILE X RAY DET (MISCELLANEOUS) IMPLANT
TRAY FOLEY CATH 14FR (SET/KITS/TRAYS/PACK) IMPLANT

## 2011-11-08 NOTE — ED Notes (Signed)
Abdominal pain; nausea and made myself vomit twice per pt. Hurting all in abdomen per pt. Denies diarrhea.

## 2011-11-08 NOTE — Anesthesia Procedure Notes (Signed)
Procedure Name: Intubation Date/Time: 11/08/2011 11:20 AM Performed by: Franco Nones Pre-anesthesia Checklist: Patient identified, Patient being monitored, Timeout performed, Emergency Drugs available and Suction available Patient Re-evaluated:Patient Re-evaluated prior to inductionOxygen Delivery Method: Circle System Utilized Preoxygenation: Pre-oxygenation with 100% oxygen Intubation Type: IV induction, Cricoid Pressure applied and Rapid sequence Laryngoscope Size: Mac and 3 Grade View: Grade I Tube type: Oral Tube size: 7.0 mm Number of attempts: 1 Airway Equipment and Method: stylet Placement Confirmation: ETT inserted through vocal cords under direct vision,  positive ETCO2 and breath sounds checked- equal and bilateral Secured at: 21 cm Tube secured with: Tape Dental Injury: Teeth and Oropharynx as per pre-operative assessment

## 2011-11-08 NOTE — Anesthesia Preprocedure Evaluation (Addendum)
Anesthesia Evaluation  Patient identified by MRN, date of birth, ID band Patient awake    Reviewed: Allergy & Precautions, H&P , NPO status , Patient's Chart, lab work & pertinent test results, reviewed documented beta blocker date and time   Airway Mallampati: II TM Distance: >3 FB Neck ROM: Full    Dental  (+) Teeth Intact   Pulmonary  breath sounds clear to auscultation        Cardiovascular Exercise Tolerance: Good hypertension, Pt. on medications Rhythm:Regular Rate:Bradycardia     Neuro/Psych    GI/Hepatic   Endo/Other    Renal/GU      Musculoskeletal   Abdominal (+)  Abdomen: tender.    Peds  Hematology   Anesthesia Other Findings   Reproductive/Obstetrics                         Anesthesia Physical Anesthesia Plan  ASA: II and Emergent  Anesthesia Plan: General   Post-op Pain Management:    Induction: Intravenous, Rapid sequence and Cricoid pressure planned  Airway Management Planned: Oral ETT  Additional Equipment:   Intra-op Plan:   Post-operative Plan: Extubation in OR  Informed Consent: I have reviewed the patients History and Physical, chart, labs and discussed the procedure including the risks, benefits and alternatives for the proposed anesthesia with the patient or authorized representative who has indicated his/her understanding and acceptance.   Dental advisory given  Plan Discussed with: Anesthesiologist  Anesthesia Plan Comments:         Anesthesia Quick Evaluation

## 2011-11-08 NOTE — ED Notes (Signed)
Family at bedside. Patient wants some water and was informed that none could be given at this time.

## 2011-11-08 NOTE — Transfer of Care (Signed)
Immediate Anesthesia Transfer of Care Note  Patient: Marc Ward  Procedure(s) Performed: Procedure(s) (LRB): EXPLORATORY LAPAROTOMY (N/A) SMALL BOWEL RESECTION (N/A)  Patient Location: PACU  Anesthesia Type: General  Level of Consciousness: awake  Airway & Oxygen Therapy: Patient Spontanous Breathing and non-rebreather face mask  Post-op Assessment: Report given to PACU RN, Post -op Vital signs reviewed and stable and Patient moving all extremities  Post vital signs: Reviewed and stable  Complications: No apparent anesthesia complications

## 2011-11-08 NOTE — ED Provider Notes (Signed)
History     CSN: [id number]  Arrival date & time 11/08/11  [id number]   First MD Initiated Contact with Patient 11/08/11 [phone]      Chief Complaint  Patient presents with  . Abdominal Pain    (Consider location/radiation/quality/duration/timing/severity/associated sxs/prior treatment) Patient is a 61 y.o. male presenting with abdominal pain. The history is provided by the patient. No language interpreter was used.  Abdominal Pain The primary symptoms of the illness include abdominal pain, nausea and vomiting. The primary symptoms of the illness do not include fever, fatigue, shortness of breath, diarrhea or dysuria. The current episode started 3 to 5 hours ago. The onset of the illness was gradual. The problem has been gradually worsening.  The abdominal pain began 3 to 5 hours ago. The pain came on gradually. The abdominal pain has been gradually worsening since its onset. The abdominal pain is located in the periumbilical region. The abdominal pain does not radiate. The abdominal pain is relieved by nothing. The abdominal pain is exacerbated by vomiting and movement.  Nausea began today. The nausea is exacerbated by motion.  The vomiting began today. Vomiting occurs 2 to 5 times per day. The emesis contains stomach contents.  Symptoms associated with the illness do not include chills, anorexia, constipation, urgency, frequency or back pain.    Past Medical History  Diagnosis Date  . Sickle cell trait   . Hypertension 2009  . H. pylori infection FEB 2011    ABO BID x1O DAYS  . IDA (iron deficiency anemia)     Past Surgical History  Procedure Date  . Tonsillectomy   . Adenoidectomy   . Circumcision   . Colonoscopy AUG 2010    ADVANCED [place] ADENOMA, TICS,   . Upper gastrointestinal endoscopy FEB 2011    H. PYLORI    Family History  Problem Relation Age of Onset  . Hypertension Mother   . Diabetes Father   . Hypertension Brother   . Colon cancer Neg Hx     History  Substance  Use Topics  . Smoking status: Never Smoker   . Smokeless tobacco: Never Used  . Alcohol Use: Yes     occasional drinker-2 beers/wk      Review of Systems  Constitutional: Negative for fever, chills, activity change, appetite change and fatigue.  HENT: Negative for congestion, sore throat, rhinorrhea, neck pain and neck stiffness.   Respiratory: Negative for cough and shortness of breath.   Cardiovascular: Negative for chest pain and palpitations.  Gastrointestinal: Positive for nausea, vomiting and abdominal pain. Negative for diarrhea, constipation and anorexia.  Genitourinary: Negative for dysuria, urgency, frequency and flank pain.  Musculoskeletal: Negative for myalgias, back pain and arthralgias.  Neurological: Negative for dizziness, weakness, light-headedness, numbness and headaches.  All other systems reviewed and are negative.    Allergies  Review of patient's allergies indicates no known allergies.  Home Medications   Current Outpatient Rx  Name Route Sig Dispense Refill  . AMLODIPINE BESYLATE 10 MG PO TABS  TAKE ONE TABLET BY MOUTH EVERY DAY FOR BLOOD PRESSURE 90 tablet 3  . AMLODIPINE BESYLATE 10 MG PO TABS  TAKE ONE TABLET BY MOUTH EVERY DAY 90 tablet 0  . FERROUS SULFATE DRIED 200 (65 FE) MG PO TABS Oral Take 1 tablet by mouth daily.     Marland Kitchen OMEPRAZOLE 20 MG PO CPDR  1 po every morning 30 capsule 11  . TRIAMTERENE-HCTZ 37.5-25 MG PO TABS  TAKE ONE TABLET BY MOUTH EVERY DAY  30 tablet 4    BP 140/81  Pulse 43  Temp(Src) 98 F (36.7 C) (Oral)  Resp 16  Ht 6' (1.829 m)  Wt 175 lb (79.379 kg)  BMI 23.73 kg/m2  SpO2 99%  Physical Exam  Nursing note and vitals reviewed. Constitutional: He is oriented to person, place, and time. He appears well-developed and well-nourished. No distress.  HENT:  Head: Normocephalic and atraumatic.  Mouth/Throat: Oropharynx is clear and moist. No oropharyngeal exudate.  Eyes: Conjunctivae and EOM are normal. Pupils are equal,  round, and reactive to light.  Neck: Normal range of motion. Neck supple.  Cardiovascular: Regular rhythm, normal heart sounds and intact distal pulses.  Exam reveals no gallop and no friction rub.   No murmur heard.      Bradycardic rate  Abdominal: Soft. Bowel sounds are normal. There is tenderness (periumbilical and epigastric pain). There is no rebound and no guarding.  Musculoskeletal: Normal range of motion. He exhibits no edema and no tenderness.  Neurological: He is alert and oriented to person, place, and time. No cranial nerve deficit.  Skin: Skin is warm and dry. No rash noted.    ED Course  Procedures (including critical care time)   Date: 11/08/2011  Rate: 52  Rhythm: sinus bradycardia  QRS Axis: normal  Intervals: normal  ST/T Wave abnormalities: normal  Conduction Disutrbances:none  Narrative Interpretation:   Old EKG Reviewed: none available  Labs Reviewed  CBC - Abnormal; Notable for the following:    RBC 3.74 (*)    Hemoglobin 12.1 (*)    HCT 35.8 (*)    All other components within normal limits  DIFFERENTIAL - Abnormal; Notable for the following:    Neutrophils Relative 87 (*)    Neutro Abs 7.9 (*)    Lymphocytes Relative 10 (*)    Monocytes Relative 2 (*)    All other components within normal limits  COMPREHENSIVE METABOLIC PANEL - Abnormal; Notable for the following:    Potassium 3.1 (*)    CO2 33 (*)    Glucose, Bld 152 (*)    GFR calc non Af Amer 57 (*)    GFR calc Af Amer 66 (*)    All other components within normal limits  LIPASE, BLOOD  PROTIME-INR  LACTIC ACID, PLASMA  CARDIAC PANEL(CRET KIN+CKTOT+MB+TROPI)  URINALYSIS, ROUTINE W REFLEX MICROSCOPIC   Ct Abdomen Pelvis W Contrast  11/08/2011  *RADIOLOGY REPORT*  Clinical Data: Umbilical abdominal pain for 1 day.  Nausea and vomiting.  CT ABDOMEN AND PELVIS WITH CONTRAST  Technique:  Multidetector CT imaging of the abdomen and pelvis was performed following the standard protocol during bolus  administration of intravenous contrast.  Contrast: OMNIPAQUE IOHEXOL 300 MG/ML  SOLN  Comparison: None.  Findings: Mild dependent atelectasis in the lung bases.  Small esophageal hiatal hernia.  There is a poorly defined hyperenhancing focus in the medial segment of the left lobe of the liver centrally measuring about 2.1 cm.  This appears to be equilibration into the liver parenchyma on delayed images.  There is another focal hypo enhancing area adjacent to the falciform ligament measuring about 12 mm diameter. This demonstrates nodular peripheral enhancement pattern consistent with hemangioma.  The other lesion may represent atypical hemangioma or focal nodular hyperplasia.  The gallbladder, spleen, pancreas, adrenal glands, kidneys, abdominal aorta, and retroperitoneal lymph nodes are unremarkable. No free fluid or free air in the abdomen.  The gastric wall is not thickened.  There is a large rounded and  edematous structure in the mid abdomen extending from the inferior to the stomach down into the pelvis.  This appears have internal mesenteric fat and vessels. The appearance is most consistent with a large small bowel on small bowel intussusception.  The terminal ileum and cecum are normal. Stool filled colon without inflammatory change.  Pelvis:  The bladder wall is not thickened.  Prostate gland is not enlarged.  No free or loculated pelvic fluid collection. The appendix is normal.  No significant pelvic lymphadenopathy. Degenerative changes in the lumbar spine.  IMPRESSION: Small bowel intussusception with large intussusceptum and edematous segment in the mid abdomen, extending to the pelvis. Focal liver lesions as described likely representing hemangiomas or focal nodular hyperplasia.  Results discussed by telephone with Dr. Brooke Dare at the time of dictation, 0532 hours on 11/30/2010.  Original Report Authenticated By: Marlon Pel, M.D.     1. Small bowel intussusception       MDM  CT  showed a large small bowel intussusception with edematous bowel. It is unlikely that this will resolve without intervention. I discussed the case with Dr. Lovell Sheehan the general surgeon on call who will evaluate the patient. The patient will require admission for likely surgical reduction. We will hold off on NG tube at this time. Will provide additional pain control. Lactate is normal therefore do not feel there is significant ischemia at this time. Will continue IV fluids and antiemetics and pain control.        Dayton Bailiff, MD 11/08/11 [phone]

## 2011-11-08 NOTE — Op Note (Signed)
Patient:  Marc Ward  DOB:  Dec 27, 1950  MRN:  [id number]   Preop Diagnosis:  Intussusception of small intestine  Postop Diagnosis:  Same  Procedure:  Exploratory laparotomy, partial small bowel resection  Surgeon:  Franky Macho, M.D.  Anes:  General endotracheal  Indications:  Patient is a 61 year old black male who presents with nausea, abdominal pain, and vomiting. This started yesterday evening. He presented the emergency room where a CT scan the abdomen and pelvis revealed a large intussusception of the small bowel. The patient now comes the operating room for an exploratory laparotomy, partial small bowel resection. The risks and benefits of the procedure including bleeding and infection were fully explained to the patient, gave informed consent.  Procedure note:  Patient is placed the supine position. After induction of general endotracheal anesthesia, the abdomen was prepped and draped using usual sterile technique with DuraPrep. Surgical site confirmation was performed.  A midline incision was made. The peritoneal cavity was entered into without difficulty. The small bowel was exteriorized. The intussusception had resolved. There was marked thickening and hyperemia of the proximal small intestine. While inspecting the small intestine from the ligament of Treitz to the terminal ileum, an intraluminal mass was found in the mid jejunum. A GIA stapler was fired both proximally and distally from this. Approximately 10 inches of small bowel were resected. The mesentery was divided using the LigaSure. Separate from the operative field, the intestine was inspected and a large polypoid lesion was present. It appeared only to affected mucosa. It was sent to pathology further examination. A side to side enteroenterostomy was then performed using a GIA 50 stapler. The enterotomy was closed using a TA 60 stapler. The staple line was bolstered using 3-0 silk sutures. The mesenteric defect was closed  using a 2-0 chromic gut running suture. The bowel was returned into the abdominal cavity an orderly fashion. The liver was inspected and no obvious palpable lesions were noted. There is no significant lymphadenopathy noted in the mesentery. Abdominal cavity was copious irrigated with normal saline. The fascia was reapproximated using a running 0 PDS suture. The subcutaneous layer was irrigated normal saline and the skin was closed using staples. Betadine ointment dry sterile dressings were applied.  All tape and needle counts were correct at the end of the procedure. Patient was extubated in the operating room and went to the recovery room awake in stable condition.  Complications:  None  EBL:  50 cc  Specimen:  Small bowel

## 2011-11-08 NOTE — H&P (Signed)
Marc Ward is an 61 y.o. male.   Chief Complaint: Abdominal pain and nausea HPI: Patient is a 61 year old black male who yesterday started having increasing abdominal pain, nausea, and vomiting. He presented emergency room for further evaluation treatment. CT scan the abdomen and pelvis reveals an intussusception of the small intestine.  Past Medical History  Diagnosis Date  . Sickle cell trait   . Hypertension 2009  . H. pylori infection FEB 2011    ABO BID x1O DAYS  . IDA (iron deficiency anemia)     Past Surgical History  Procedure Date  . Tonsillectomy   . Adenoidectomy   . Circumcision   . Colonoscopy AUG 2010    [facility] [place] ADENOMA, TICS,   . Upper gastrointestinal endoscopy FEB 2011    [facility]. [facility]I    Family History  Problem Relation Age of Onset  . Hypertension Mother   . Diabetes Father   . Hypertension Brother   . Colon cancer Neg Hx    Social History:  reports that he has never smoked. He has never used smokeless tobacco. He reports that he drinks alcohol. He reports that he uses illicit drugs (Marijuana) about once per week.  Allergies: No Known Allergies  Medications Prior to Admission  Medication Dose Route Frequency Provider Last Rate Last Dose  . 0.9 %  sodium chloride infusion   Intravenous Continuous Dayton Bailiff, MD 125 mL/hr at 11/08/11 0357    . HYDROmorphone (DILAUDID) injection 1 mg  1 mg Intravenous Once Dayton Bailiff, MD   1 mg at 11/08/11 0401  . HYDROmorphone (DILAUDID) injection 1 mg  1 mg Intravenous Once Dayton Bailiff, MD   1 mg at 11/08/11 0600  . iohexol (OMNIPAQUE) 300 MG/ML solution 100 mL  100 mL Intravenous Once PRN Medication Radiologist, MD   100 mL at 11/08/11 0521  . metoCLOPramide (REGLAN) injection 10 mg  10 mg Intravenous Once Dayton Bailiff, MD   10 mg at 11/08/11 0426  . ondansetron (ZOFRAN) injection 4 mg  4 mg Intravenous Once Dayton Bailiff, MD   4 mg at 11/08/11 0358  . ondansetron (ZOFRAN) injection 4 mg  4 mg Intravenous Once Dayton Bailiff, MD   4 mg at 11/08/11 0432  . potassium chloride 10 mEq in 100 mL IVPB  10 mEq Intravenous Once Dayton Bailiff, MD 100 mL/hr at 11/08/11 0605 10 mEq at 11/08/11 0605  . DISCONTD: potassium chloride SA (K-DUR,KLOR-CON) CR tablet 40 mEq  40 mEq Oral Once Dayton Bailiff, MD       Medications Prior to Admission  Medication Sig Dispense Refill  . amLODipine (NORVASC) 10 MG tablet TAKE ONE TABLET BY MOUTH EVERY DAY FOR BLOOD PRESSURE  90 tablet  3  . amLODipine (NORVASC) 10 MG tablet TAKE ONE TABLET BY MOUTH EVERY DAY  90 tablet  0  . Ferrous Sulfate Dried (FEOSOL) 200 (65 FE) MG TABS Take 1 tablet by mouth daily.       Marland Kitchen omeprazole (PRILOSEC) 20 MG capsule 1 po every morning  30 capsule  11  . triamterene-hydrochlorothiazide (MAXZIDE-25) 37.5-25 MG per tablet TAKE ONE TABLET BY MOUTH EVERY DAY  30 tablet  4    Results for orders placed during the hospital encounter of 11/08/11 (from the past 48 hour(s))  CBC     Status: Abnormal   Collection Time   11/08/11  3:51 AM      Component Value Range Comment   WBC 9.1  4.0 - 10.5 (K/uL)  RBC 3.74 (*) 4.22 - 5.81 (MIL/uL)    Hemoglobin 12.1 (*) 13.0 - 17.0 (g/dL)    HCT 16.1 (*) 09.6 - 52.0 (%)    MCV 95.7  78.0 - 100.0 (fL)    MCH 32.4  26.0 - 34.0 (pg)    MCHC 33.8  30.0 - 36.0 (g/dL)    RDW 04.5  40.9 - 81.1 (%)    Platelets 186  150 - 400 (K/uL)   DIFFERENTIAL     Status: Abnormal   Collection Time   11/08/11  3:51 AM      Component Value Range Comment   Neutrophils Relative 87 (*) 43 - 77 (%)    Neutro Abs 7.9 (*) 1.7 - 7.7 (K/uL)    Lymphocytes Relative 10 (*) 12 - 46 (%)    Lymphs Abs 0.9  0.7 - 4.0 (K/uL)    Monocytes Relative 2 (*) 3 - 12 (%)    Monocytes Absolute 0.2  0.1 - 1.0 (K/uL)    Eosinophils Relative 1  0 - 5 (%)    Eosinophils Absolute 0.1  0.0 - 0.7 (K/uL)    Basophils Relative 0  0 - 1 (%)    Basophils Absolute 0.0  0.0 - 0.1 (K/uL)   COMPREHENSIVE METABOLIC PANEL     Status: Abnormal   Collection Time   11/08/11   3:51 AM      Component Value Range Comment   Sodium 142  135 - 145 (mEq/L)    Potassium 3.1 (*) 3.5 - 5.1 (mEq/L)    Chloride 102  96 - 112 (mEq/L)    CO2 33 (*) 19 - 32 (mEq/L)    Glucose, Bld 152 (*) 70 - 99 (mg/dL)    BUN 14  6 - 23 (mg/dL)    Creatinine, Ser 9.14  0.50 - 1.35 (mg/dL)    Calcium 9.3  8.4 - 10.5 (mg/dL)    Total Protein 6.9  6.0 - 8.3 (g/dL)    Albumin 3.7  3.5 - 5.2 (g/dL)    AST 15  0 - 37 (U/L)    ALT 8  0 - 53 (U/L)    Alkaline Phosphatase 43  39 - 117 (U/L)    Total Bilirubin 0.4  0.3 - 1.2 (mg/dL)    GFR calc non Af Amer 57 (*) >90 (mL/min)    GFR calc Af Amer 66 (*) >90 (mL/min)   LIPASE, BLOOD     Status: Normal   Collection Time   11/08/11  3:51 AM      Component Value Range Comment   Lipase 17  11 - 59 (U/L)   PROTIME-INR     Status: Normal   Collection Time   11/08/11  3:51 AM      Component Value Range Comment   Prothrombin Time 12.6  11.6 - 15.2 (seconds)    INR 0.92  0.00 - 1.49    LACTIC ACID, PLASMA     Status: Normal   Collection Time   11/08/11  3:51 AM      Component Value Range Comment   Lactic Acid, Venous 2.2  0.5 - 2.2 (mmol/L)   CARDIAC PANEL(CRET KIN+CKTOT+MB+TROPI)     Status: Normal   Collection Time   11/08/11  3:51 AM      Component Value Range Comment   Total CK 169  7 - 232 (U/L)    CK, MB 2.7  0.3 - 4.0 (ng/mL)    Troponin I <0.30  <0.30 (ng/mL)  Relative Index 1.6  0.0 - 2.5    URINALYSIS, ROUTINE W REFLEX MICROSCOPIC     Status: Abnormal   Collection Time   11/08/11  6:03 AM      Component Value Range Comment   Color, Urine YELLOW  YELLOW     APPearance CLEAR  CLEAR     Specific Gravity, Urine <1.005 (*) 1.005 - 1.030     pH 7.0  5.0 - 8.0     Glucose, UA NEGATIVE  NEGATIVE (mg/dL)    Hgb urine dipstick NEGATIVE  NEGATIVE     Bilirubin Urine NEGATIVE  NEGATIVE     Ketones, ur NEGATIVE  NEGATIVE (mg/dL)    Protein, ur NEGATIVE  NEGATIVE (mg/dL)    Urobilinogen, UA 0.2  0.0 - 1.0 (mg/dL)    Nitrite NEGATIVE   NEGATIVE     Leukocytes, UA NEGATIVE  NEGATIVE  MICROSCOPIC NOT DONE ON URINES WITH NEGATIVE PROTEIN, BLOOD, LEUKOCYTES, NITRITE, OR GLUCOSE <1000 mg/dL.   Ct Abdomen Pelvis W Contrast  11/08/2011  *RADIOLOGY REPORT*  Clinical Data: Umbilical abdominal pain for 1 day.  Nausea and vomiting.  CT ABDOMEN AND PELVIS WITH CONTRAST  Technique:  Multidetector CT imaging of the abdomen and pelvis was performed following the standard protocol during bolus administration of intravenous contrast.  Contrast: OMNIPAQUE IOHEXOL 300 MG/ML  SOLN  Comparison: None.  Findings: Mild dependent atelectasis in the lung bases.  Small esophageal hiatal hernia.  There is a poorly defined hyperenhancing focus in the medial segment of the left lobe of the liver centrally measuring about 2.1 cm.  This appears to be equilibration into the liver parenchyma on delayed images.  There is another focal hypo enhancing area adjacent to the falciform ligament measuring about 12 mm diameter. This demonstrates nodular peripheral enhancement pattern consistent with hemangioma.  The other lesion may represent atypical hemangioma or focal nodular hyperplasia.  The gallbladder, spleen, pancreas, adrenal glands, kidneys, abdominal aorta, and retroperitoneal lymph nodes are unremarkable. No free fluid or free air in the abdomen.  The gastric wall is not thickened.  There is a large rounded and edematous structure in the mid abdomen extending from the inferior to the stomach down into the pelvis.  This appears have internal mesenteric fat and vessels. The appearance is most consistent with a large small bowel on small bowel intussusception.  The terminal ileum and cecum are normal. Stool filled colon without inflammatory change.  Pelvis:  The bladder wall is not thickened.  Prostate gland is not enlarged.  No free or loculated pelvic fluid collection. The appendix is normal.  No significant pelvic lymphadenopathy. Degenerative changes in the lumbar  spine.  IMPRESSION: Small bowel intussusception with large intussusceptum and edematous segment in the mid abdomen, extending to the pelvis. Focal liver lesions as described likely representing hemangiomas or focal nodular hyperplasia.  Results discussed by telephone with Dr. Brooke Dare at the time of dictation, 0532 hours on 11/30/2010.  Original Report Authenticated By: Marlon Pel, M.D.    Review of Systems  Constitutional: Negative.   HENT: Negative.   Eyes: Negative.   Respiratory: Negative.   Cardiovascular: Negative.   Gastrointestinal: Positive for nausea, vomiting and abdominal pain.  Genitourinary: Negative.   Musculoskeletal: Negative.   Skin: Negative.   Neurological: Negative.   Endo/Heme/Allergies: Negative.     Blood pressure 126/81, pulse 89, temperature 98 F (36.7 C), temperature source Oral, resp. rate 12, height 6' (1.829 m), weight 79.379 kg (175 lb), SpO2 98.00%. Physical  Exam  Constitutional: He is oriented to person, place, and time. He appears well-developed and well-nourished.  HENT:  Head: Normocephalic and atraumatic.  Neck: Normal range of motion. Neck supple.  Cardiovascular: Normal rate, regular rhythm and normal heart sounds.   Respiratory: Effort normal and breath sounds normal.  GI: Soft. He exhibits distension. There is tenderness.  Neurological: He is alert and oriented to person, place, and time.  Skin: Skin is warm and dry.  Psychiatric: He has a normal mood and affect.     Assessment/Plan Impression: Intussusception of small intestine Plan: Patient returned to the operative room for exploratory laparotomy, probable partial small bowel resection. The risks and benefits of the procedure including bleeding and infection were fully explained to the patient, gave informed consent  Melena Hayes A 11/08/2011, 7:41 AM

## 2011-11-08 NOTE — Anesthesia Postprocedure Evaluation (Signed)
Anesthesia Post Note  Patient: Marc Ward  Procedure(s) Performed: Procedure(s) (LRB): EXPLORATORY LAPAROTOMY (N/A) SMALL BOWEL RESECTION (N/A)  Anesthesia type: General  Patient location: PACU  Post pain: Pain level controlled  Post assessment: Post-op Vital signs reviewed, Patient's Cardiovascular Status Stable, Respiratory Function Stable, Patent Airway, No signs of Nausea or vomiting and Pain level controlled  Last Vitals:  Filed Vitals:   11/08/11 1300  BP: 119/72  Pulse: 62  Temp:   Resp: 13    Post vital signs: Reviewed and stable  Level of consciousness: awake and alert   Complications: No apparent anesthesia complications

## 2011-11-08 NOTE — ED Notes (Signed)
Family at bedside. Patient tolerated the foley catheter well. Patient belongings were placed in the patient belongings bags and left on the counter for the family members. Patient does not need anything at this time.

## 2011-11-08 NOTE — Progress Notes (Signed)
Received report from Elisa, Charity fundraiser.

## 2011-11-08 NOTE — ED Notes (Signed)
Wiped pt down with CHG wipes.

## 2011-11-09 ENCOUNTER — Ambulatory Visit: Payer: Self-pay | Admitting: Urgent Care

## 2011-11-09 LAB — CBC
HCT: 31.5 % — ABNORMAL LOW (ref 39.0–52.0)
MCH: 32.7 pg (ref 26.0–34.0)
MCHC: 33.3 g/dL (ref 30.0–36.0)
MCV: 98.1 fL (ref 78.0–100.0)
RDW: 13 % (ref 11.5–15.5)

## 2011-11-09 LAB — PHOSPHORUS: Phosphorus: 2.7 mg/dL (ref 2.3–4.6)

## 2011-11-09 LAB — BASIC METABOLIC PANEL
BUN: 9 mg/dL (ref 6–23)
Calcium: 8.2 mg/dL — ABNORMAL LOW (ref 8.4–10.5)
GFR calc non Af Amer: 63 mL/min — ABNORMAL LOW (ref >90)
Glucose, Bld: 106 mg/dL — ABNORMAL HIGH (ref 70–99)
Sodium: 140 mEq/L (ref 135–145)

## 2011-11-09 MED ORDER — ACETAMINOPHEN 10 MG/ML IV SOLN
1000.0000 mg | Freq: Four times a day (QID) | INTRAVENOUS | Status: AC
  Administered 2011-11-09 – 2011-11-10 (×3): 1000 mg via INTRAVENOUS
  Filled 2011-11-09 (×4): qty 100

## 2011-11-09 MED ORDER — SODIUM CHLORIDE 0.9 % IJ SOLN
INTRAMUSCULAR | Status: AC
  Administered 2011-11-09: 10 mL
  Filled 2011-11-09: qty 3

## 2011-11-09 MED ORDER — KCL IN DEXTROSE-NACL 40-5-0.45 MEQ/L-%-% IV SOLN
INTRAVENOUS | Status: DC
  Administered 2011-11-09 – 2011-11-12 (×7): via INTRAVENOUS

## 2011-11-09 MED ORDER — PNEUMOCOCCAL VAC POLYVALENT 25 MCG/0.5ML IJ INJ
0.5000 mL | INJECTION | Freq: Once | INTRAMUSCULAR | Status: AC
  Administered 2011-11-09: 0.5 mL via INTRAMUSCULAR
  Filled 2011-11-09: qty 0.5

## 2011-11-09 NOTE — Progress Notes (Signed)
1 Day Post-Op  Subjective: Minimal incisional pain. Awake and alert.  Objective: Vital signs in last 24 hours: Temp:  [98 F (36.7 C)-99 F (37.2 C)] 98.3 F (36.8 C) (04/22 0603) Pulse Rate:  [48-83] 48  (04/22 0603) Resp:  [11-22] 20  (04/22 0603) BP: (104-130)/(64-80) 104/69 mmHg (04/22 0603) SpO2:  [92 %-100 %] 92 % (04/22 0603) Last BM Date: 11/07/11  Intake/Output from previous day: 04/21 0701 - 04/22 0700 In: 2050 [P.O.:750; I.V.:1300] Out: 2560 [Urine:2110; Blood:50] Intake/Output this shift: Total I/O In: 100 [IV Piggyback:100] Out: -   General appearance: alert, cooperative and no distress Resp: clear to auscultation bilaterally Cardio: regular rate and rhythm, S1, S2 normal, no murmur, click, rub or gallop GI: Soft, flat. Dressing intact.  Lab Results:   [facility] 11/09/11 0517 11/08/11 0351  WBC 7.6 9.1  HGB 10.5* 12.1*  HCT 31.5* 35.8*  PLT 144* 186   BMET  Basename 11/09/11 0517 11/08/11 0351  NA 140 142  K 3.5 3.1*  CL 101 102  CO2 35* 33*  GLUCOSE 106* 152*  BUN 9 14  CREATININE 1.20 1.31  CALCIUM 8.2* 9.3   PT/INR  Basename 11/08/11 0351  LABPROT 12.6  INR 0.92    Studies/Results: Ct Abdomen Pelvis W Contrast  11/08/2011  *RADIOLOGY REPORT*  Clinical Data: Umbilical abdominal pain for 1 day.  Nausea and vomiting.  CT ABDOMEN AND PELVIS WITH CONTRAST  Technique:  Multidetector CT imaging of the abdomen and pelvis was performed following the standard protocol during bolus administration of intravenous contrast.  Contrast: OMNIPAQUE IOHEXOL 300 MG/ML  SOLN  Comparison: None.  Findings: Mild dependent atelectasis in the lung bases.  Small esophageal hiatal hernia.  There is a poorly defined hyperenhancing focus in the medial segment of the left lobe of the liver centrally measuring about 2.1 cm.  This appears to be equilibration into the liver parenchyma on delayed images.  There is another focal hypo enhancing area adjacent to the  falciform ligament measuring about 12 mm diameter. This demonstrates nodular peripheral enhancement pattern consistent with hemangioma.  The other lesion may represent atypical hemangioma or focal nodular hyperplasia.  The gallbladder, spleen, pancreas, adrenal glands, kidneys, abdominal aorta, and retroperitoneal lymph nodes are unremarkable. No free fluid or free air in the abdomen.  The gastric wall is not thickened.  There is a large rounded and edematous structure in the mid abdomen extending from the inferior to the stomach down into the pelvis.  This appears have internal mesenteric fat and vessels. The appearance is most consistent with a large small bowel on small bowel intussusception.  The terminal ileum and cecum are normal. Stool filled colon without inflammatory change.  Pelvis:  The bladder wall is not thickened.  Prostate gland is not enlarged.  No free or loculated pelvic fluid collection. The appendix is normal.  No significant pelvic lymphadenopathy. Degenerative changes in the lumbar spine.  IMPRESSION: Small bowel intussusception with large intussusceptum and edematous segment in the mid abdomen, extending to the pelvis. Focal liver lesions as described likely representing hemangiomas or focal nodular hyperplasia.  Results discussed by telephone with Dr. Brooke Dare at the time of dictation, 0532 hours on 11/30/2010.  Original Report Authenticated By: Marlon Pel, M.D.   Dg Chest Port 1 View  11/08/2011  *RADIOLOGY REPORT*  Clinical Data: Hypertension.  Preop.  Small bowel intussusception.  PORTABLE CHEST - 1 VIEW  Comparison: None.  Findings: Heart size is normal.  The lungs are free  of focal consolidations and pleural effusions.  No edema. Visualized osseous structures have a normal appearance.  IMPRESSION: Negative exam.  Original Report Authenticated By: Patterson Hammersmith, M.D.    Anti-infectives: Anti-infectives     Start     Dose/Rate Route Frequency Ordered Stop   11/08/11 1009    ceFAZolin (ANCEF) IVPB 2 g/50 mL premix        2 g 100 mL/hr over 30 Minutes Intravenous 60 min pre-op 11/08/11 1009 11/08/11 1056          Assessment/Plan: s/p Procedure(s): EXPLORATORY LAPAROTOMY SMALL BOWEL RESECTION Impression: Stable, status post partial small bowel resection. Plan: Advance to full liquid diet. Adjust IV fluids. Start ambulating.  LOS: 1 day    Din Bookwalter A 11/09/2011

## 2011-11-09 NOTE — Anesthesia Postprocedure Evaluation (Signed)
Anesthesia Post Note  Patient: Marc Ward  Procedure(s) Performed: Procedure(s) (LRB): EXPLORATORY LAPAROTOMY (N/A) SMALL BOWEL RESECTION (N/A)  Anesthesia type: General  Patient location: 307  Post pain: Pain level controlled  Post assessment: Post-op Vital signs reviewed, Patient's Cardiovascular Status Stable, Respiratory Function Stable, Patent Airway, No signs of Nausea or vomiting and Pain level controlled  Last Vitals:  Filed Vitals:   11/09/11 1347  BP: 125/72  Pulse: 69  Temp: 36.8 C  Resp: 19    Post vital signs: Reviewed and stable  Level of consciousness: awake and alert   Complications: No apparent anesthesia complications

## 2011-11-09 NOTE — Addendum Note (Signed)
Addendum  created 11/09/11 1411 by Abdulmalik Darco S Felecia Stanfill, CRNA   Modules edited:Notes Section    

## 2011-11-09 NOTE — Progress Notes (Signed)
   CARE MANAGEMENT NOTE 11/09/2011  Patient:  Marc Ward,Marc Ward   Account Number:  [id number]  Date Initiated:  11/09/2011  Documentation initiated by:  Anibal Henderson  Subjective/Objective Assessment:   admitted with SBO, intussusception and surgical intervention- PO1. pt is from home, lives with mother,  and will be returning home at D/C.     Action/Plan:   No needs identified at present- will follow for needs at D/C   Anticipated DC Date:  11/11/2011   Anticipated DC Plan:  HOME/SELF CARE      DC Planning Services  CM consult      Choice offered to / List presented to:             Status of service:  In process, will continue to follow Medicare Important Message given?   (If response is "NO", the following Medicare IM given date fields will be blank) Date Medicare IM given:   Date Additional Medicare IM given:    Discharge Disposition:    Per UR Regulation:  Reviewed for med. necessity/level of care/duration of stay  If discussed at Long Length of Stay Meetings, dates discussed:    Comments:  11/09/11/ 1530 Anibal Henderson RN

## 2011-11-10 LAB — CBC
HCT: 31.8 % — ABNORMAL LOW (ref 39.0–52.0)
Hemoglobin: 10.6 g/dL — ABNORMAL LOW (ref 13.0–17.0)
MCH: 32.5 pg (ref 26.0–34.0)
MCHC: 33.3 g/dL (ref 30.0–36.0)
MCV: 97.5 fL (ref 78.0–100.0)
RBC: 3.26 MIL/uL — ABNORMAL LOW (ref 4.22–5.81)

## 2011-11-10 LAB — BASIC METABOLIC PANEL
BUN: 7 mg/dL (ref 6–23)
CO2: 32 mEq/L (ref 19–32)
Calcium: 8.6 mg/dL (ref 8.4–10.5)
Creatinine, Ser: 1.19 mg/dL (ref 0.50–1.35)
GFR calc non Af Amer: 64 mL/min — ABNORMAL LOW (ref >90)
Glucose, Bld: 108 mg/dL — ABNORMAL HIGH (ref 70–99)

## 2011-11-10 LAB — MAGNESIUM: Magnesium: 1.9 mg/dL (ref 1.5–2.5)

## 2011-11-10 MED ORDER — HYDROCODONE-ACETAMINOPHEN 5-325 MG PO TABS
1.0000 | ORAL_TABLET | ORAL | Status: DC | PRN
  Administered 2011-11-10: 2 via ORAL
  Filled 2011-11-10: qty 2

## 2011-11-10 NOTE — Progress Notes (Signed)
2 Days Post-Op  Subjective: Feeling well. Did have a small episode of flatus. No significant bowel movement yet. It is tolerating full liquid diet fairly well.  Objective: Vital signs in last 24 hours: Temp:  [98 F (36.7 C)-98.7 F (37.1 C)] 98 F (36.7 C) (04/23 9147) Pulse Rate:  [62-76] 62  (04/23 0614) Resp:  [19-24] 20  (04/23 0614) BP: (100-140)/(65-87) 100/65 mmHg (04/23 0614) SpO2:  [87 %-96 %] 94 % (04/23 0614) Last BM Date: 11/07/11  Intake/Output from previous day: 04/22 0701 - 04/23 0700 In: 2823.3 [P.O.:240; I.V.:2383.3; IV Piggyback:200] Out: 3325 [Urine:3325] Intake/Output this shift: Total I/O In: 240 [P.O.:240] Out: 425 [Urine:425]  General appearance: alert, cooperative and no distress Resp: clear to auscultation bilaterally Cardio: regular rate and rhythm, S1, S2 normal, no murmur, click, rub or gallop GI: Soft, flat. Minimal bowel sounds heard. Incision healing well.  Lab Results:   [facility] 11/10/11 0541 11/09/11 0517  WBC 7.5 7.6  HGB 10.6* 10.5*  HCT 31.8* 31.5*  PLT 159 144*   BMET  Basename 11/10/11 0541 11/09/11 0517  NA 139 140  K 3.7 3.5  CL 102 101  CO2 32 35*  GLUCOSE 108* 106*  BUN 7 9  CREATININE 1.19 1.20  CALCIUM 8.6 8.2*   PT/INR  Basename 11/08/11 0351  LABPROT 12.6  INR 0.92    Studies/Results: No results found.  Anti-infectives: Anti-infectives     Start     Dose/Rate Route Frequency Ordered Stop   11/08/11 1009   ceFAZolin (ANCEF) IVPB 2 g/50 mL premix        2 g 100 mL/hr over 30 Minutes Intravenous 60 min pre-op 11/08/11 1009 11/08/11 1056          Assessment/Plan: s/p Procedure(s): EXPLORATORY LAPAROTOMY SMALL BOWEL RESECTION Impression: Stable, status post partial small bowel resection. Awaiting full return of bowel function. Continue monitoring labs. Adjust IV fluid.  LOS: 2 days    Samaia Iwata A 11/10/2011

## 2011-11-11 ENCOUNTER — Encounter (HOSPITAL_COMMUNITY): Payer: Self-pay | Admitting: General Surgery

## 2011-11-11 LAB — CBC
HCT: 31.4 % — ABNORMAL LOW (ref 39.0–52.0)
MCH: 32.4 pg (ref 26.0–34.0)
MCV: 95.2 fL (ref 78.0–100.0)
Platelets: 157 10*3/uL (ref 150–400)
RDW: 12.1 % (ref 11.5–15.5)
WBC: 5.9 10*3/uL (ref 4.0–10.5)

## 2011-11-11 LAB — BASIC METABOLIC PANEL
CO2: 29 mEq/L (ref 19–32)
Calcium: 8.8 mg/dL (ref 8.4–10.5)
Chloride: 104 mEq/L (ref 96–112)
Creatinine, Ser: 1.17 mg/dL (ref 0.50–1.35)
Glucose, Bld: 111 mg/dL — ABNORMAL HIGH (ref 70–99)

## 2011-11-11 NOTE — Progress Notes (Signed)
Patient ambulating halls well independently

## 2011-11-11 NOTE — Progress Notes (Signed)
3 Days Post-Op  Subjective: Is passing flatus, though no bowel movement yet. No nausea or vomiting have been noted.  Objective: Vital signs in last 24 hours: Temp:  [97.9 F (36.6 C)-98.6 F (37 C)] 98.6 F (37 C) (04/24 0609) Pulse Rate:  [61-75] 66  (04/24 1001) Resp:  [18-20] 20  (04/24 0609) BP: (108-141)/(75-92) 108/75 mmHg (04/24 1001) SpO2:  [94 %-97 %] 94 % (04/24 0609) Last BM Date: 11/07/11  Intake/Output from previous day: 04/23 0701 - 04/24 0700 In: 3180.4 [P.O.:720; I.V.:2460.4] Out: 1525 [Urine:1525] Intake/Output this shift: Total I/O In: -  Out: 475 [Urine:475]  General appearance: alert and cooperative Resp: clear to auscultation bilaterally Cardio: regular rate and rhythm, S1, S2 normal, no murmur, click, rub or gallop GI: Soft, incision healing well. Occasional bowel sounds heard.  Lab Results:   Basename 11/11/11 0454 11/10/11 0541  WBC 5.9 7.5  HGB 10.7* 10.6*  HCT 31.4* 31.8*  PLT 157 159   BMET  Basename 11/11/11 0454 11/10/11 0541  NA 140 139  K 3.6 3.7  CL 104 102  CO2 29 32  GLUCOSE 111* 108*  BUN 6 7  CREATININE 1.17 1.19  CALCIUM 8.8 8.6   PT/INR No results found for this basename: LABPROT:2,INR:2 in the last 72 hours  Studies/Results: No results found.  Anti-infectives: Anti-infectives     Start     Dose/Rate Route Frequency Ordered Stop   11/08/11 1009   ceFAZolin (ANCEF) IVPB 2 g/50 mL premix        2 g 100 mL/hr over 30 Minutes Intravenous 60 min pre-op 11/08/11 1009 11/08/11 1056          Assessment/Plan: s/p Procedure(s): EXPLORATORY LAPAROTOMY SMALL BOWEL RESECTION Impression: Awaiting return of bowel function. 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 Patient has been told of these results.  LOS: 3 days    Eldoris Beiser A 11/11/2011

## 2011-11-11 NOTE — Plan of Care (Signed)
Problem: Phase II Progression Outcomes Goal: Return of bowel function (flatus, BM) IF ABDOMINAL SURGERY:  Outcome: Progressing Patient passing gas, no BM yet though

## 2011-11-12 MED ORDER — OXYCODONE-ACETAMINOPHEN 7.5-325 MG PO TABS: 1.0000 | ORAL_TABLET | ORAL | Status: DC | PRN

## 2011-11-12 MED ORDER — MAGNESIUM HYDROXIDE 400 MG/5ML PO SUSP
30.0000 mL | Freq: Two times a day (BID) | ORAL | Status: DC
  Administered 2011-11-12: 30 mL via ORAL
  Filled 2011-11-12: qty 30

## 2011-11-12 NOTE — Progress Notes (Signed)
Patient received discharge instructions along with follow up appointments and prescriptions. Patient verbalized understanding of all instructions. Patient was escorted by staff via wheelchair to vehicle. Patient discharged to home in stable condition. 

## 2011-11-12 NOTE — Discharge Instructions (Signed)
Open Small Bowel Resection The small bowel is the top part of your intestines. It is also called the small intestine. It is part of the digestive system. When food leaves the stomach, it goes into the small bowel. Most food is then absorbed into the body. However, the small bowel can become blocked or harmed by disease. In this case, part of it may need to be removed. This procedure is called a small bowel resection. One type of procedure is called an open resection. This means the surgeon will make a long cut (incision) to open your abdomen. Part of the small bowel will be taken out through this opening. You will probably need to stay in the hospital for several days after the procedure. Then you will continue recovering at home.  LET YOUR CAREGIVER KNOW ABOUT:  Allergies to food or medicine.   Medicines taken, including vitamins, herbs, eyedrops, over-the-counter medicines, and creams.   Use of steroids (by mouth or creams).   Previous problems with anesthetics or numbing medicines.   Any history of bleeding problems or blood clots.   Previous surgery.   Other health problems, including diabetes and kidney problems.   Possibility of pregnancy, if this applies.  RISKS AND COMPLICATIONS  Bleeding.   Infection.   A blood clot that forms somewhere in your veins and travels to the lung.   Leaking of intestinal fluids into the abdomen.   A hernia. This occurs when the abdomen bulges out.   Damage to other organs in the abdomen.   Scarring where the incision is made or inside your body, around the intestines.   Not being able to absorb enough vitamins and nutrition through the small bowel.  BEFORE THE PROCEDURE  A medical evaluation will be done. This may include:   A physical exam.   Tests to make sure you are healthy enough for a procedure. These may include blood tests and X-rays. Imaging scans may be done to take pictures of the small bowel.   An open small bowel resection  requires medicine that makes you sleep (general anesthetic). Ask what you can expect.  PROCEDURE A small bowel resection can take 1 to 4 hours.  Your heart rate, blood pressure, and oxygen level .   You will be given an intravenous line (IV). A needle will be inserted in your hand or arm. It is hooked to a plastic tube. Medicine will flow directly into your body through the IV.   You might be given a medicine to help you relax (sedative).   You will be given a general anesthetic.   Several tubes may be put in your body.   A tube in your throat will help you breathe during the procedure. It also may be used to give you anesthetic gas during the procedure.   A nasogastric tube will go through your nose and into your stomach.Fluids from your stomach will drain through this tube during and after the procedure.   A thin tube (catheter) in your bladder will drain urine during and after the procedure.   Once you are asleep, the surgeon will make an incision in the middle of your abdomen.   The surgeon will find the part of the small bowel that needs to be removed and take it out.   The surgeon will close the incision with staples or stitches.  AFTER THE PROCEDURE  You will stay in a recovery area until the anesthesia has worn off. Your blood pressure and pulse  will be checked every so often. Then you will be taken to a hospital room.   Some pain is normal. You will be given pain medicine. Be sure to tell you caregiver if the pain gets worse.   You will continue to get fluids through the IV for awhile.   The nasogastric tube usually stays in for a few days.   After the nasogastric tube is out, you can start eating food again. You will start with liquids. If all is okay, the IV can come out, too.   You will be asked to get up and start walking within a day.This helps keep blood clots from forming in your legs.   You may be told to breathe deeply. You may also be told to cough now and  then.This keeps your airways open.   Most people stay in the hospital for 3 to 7 days after this procedure.  Document Released: 12/08/2010 Document Revised: 06/25/2011 Document Reviewed: 12/08/2010 [facility] Patient Information 2012 [place], [place].

## 2011-11-12 NOTE — Progress Notes (Signed)
4 Days Post-Op  Subjective: Does have flatus, but no bowel movement yet.  Objective: Vital signs in last 24 hours: Temp:  [98.1 F (36.7 C)-98.4 F (36.9 C)] 98.2 F (36.8 C) (04/25 0658) Pulse Rate:  [54-67] 61  (04/25 0658) Resp:  [18-20] 20  (04/25 0658) BP: (105-120)/(67-80) 106/67 mmHg (04/25 0658) SpO2:  [97 %-98 %] 97 % (04/25 0658) Last BM Date: 11/07/11  Intake/Output from previous day: 04/24 0701 - 04/25 0700 In: 3078.8 [P.O.:1280; I.V.:1798.8] Out: 2700 [Urine:2700] Intake/Output this shift: Total I/O In: 240 [P.O.:240] Out: -   General appearance: alert, cooperative and no distress Resp: clear to auscultation bilaterally Cardio: regular rate and rhythm, S1, S2 normal, no murmur, click, rub or gallop GI: Soft. Incision healing well. Bowel sounds heard.  Lab Results:   Basename 11/11/11 0454 11/10/11 0541  WBC 5.9 7.5  HGB 10.7* 10.6*  HCT 31.4* 31.8*  PLT 157 159   BMET  Basename 11/11/11 0454 11/10/11 0541  NA 140 139  K 3.6 3.7  CL 104 102  CO2 29 32  GLUCOSE 111* 108*  BUN 6 7  CREATININE 1.17 1.19  CALCIUM 8.8 8.6   PT/INR No results found for this basename: LABPROT:2,INR:2 in the last 72 hours  Studies/Results: No results found.  Anti-infectives: Anti-infectives     Start     Dose/Rate Route Frequency Ordered Stop   11/08/11 1009   ceFAZolin (ANCEF) IVPB 2 g/50 mL premix        2 g 100 mL/hr over 30 Minutes Intravenous 60 min pre-op 11/08/11 1009 11/08/11 1056          Assessment/Plan: s/p Procedure(s): EXPLORATORY LAPAROTOMY SMALL BOWEL RESECTION Impression: Stable, awaiting full return of bowel function. We'll give him milk of magnesia today. Hopefully will discharge in the next 24-48 hours.  LOS: 4 days    Mykalah Saari A 11/12/2011

## 2011-11-12 NOTE — Discharge Summary (Signed)
Physician Discharge Summary  Patient ID: Marc Ward MRN: [id number] DOB/AGE: 04-05-1951 61 y.o.  Admit date: 11/08/2011 Discharge date: 11/12/2011  Admission Diagnoses: Intussusception of small bowel  Discharge Diagnoses: Same, villous adenoma of small bowel Active Problems:  * No active hospital problems. *    Discharged Condition: good  Hospital Course: Patient is a 61 year old black male who presented to the emergency room with worsening abdominal pain, nausea, vomiting. He was found to have intussusception of the small intestine. He was taken urgently to the operating room on 11/08/2011 and underwent a partial small bowel resection. He tolerated surgery well. Final pathology did reveal a villous adenoma with no evidence of malignancy seen. Interestingly, he was in the middle of a workup for her anemia of unknown etiology, chronic blood loss from the GI tract. His diet has been fed without difficulty once his bowel function returned. He is being discharged home on 11/12/2011 in good and improving condition.  Significant Diagnostic Studies: CT scan of the abdomen and pelvis  Treatments: surgery: Partial small bowel resection on 11/08/2011  Discharge Exam: Blood pressure 107/75, pulse 61, temperature 98.2 F (36.8 C), temperature source Oral, resp. rate 20, height 6' (1.829 m), weight 79.379 kg (175 lb), SpO2 97.00%. General appearance: alert, cooperative and no distress Resp: clear to auscultation bilaterally Cardio: regular rate and rhythm, S1, S2 normal, no murmur, click, rub or gallop GI: soft, non-tender; bowel sounds normal; no masses,  no organomegaly. Incision healing well.  Disposition: 01-Home or Self Care  Discharge Orders    Future Appointments: Provider: Department: Dept Phone: Center:   06/07/2012 1:00 PM Kerri Perches, MD [facility] [phone] [facility]     Medication List  As of 11/12/2011  1:20 PM   TAKE these medications         amLODipine 10 MG  tablet   Commonly known as: NORVASC   TAKE ONE TABLET BY MOUTH EVERY DAY FOR BLOOD PRESSURE      FEOSOL 200 (65 FE) MG Tabs   Generic drug: Ferrous Sulfate Dried   Take 1 tablet by mouth daily.      omeprazole 20 MG capsule   Commonly known as: PRILOSEC   1 po every morning      oxyCODONE-acetaminophen 7.5-325 MG per tablet   Commonly known as: PERCOCET   Take 1-2 tablets by mouth every 4 (four) hours as needed for pain.      triamterene-hydrochlorothiazide 37.5-25 MG per tablet   Commonly known as: MAXZIDE-25   TAKE ONE TABLET BY MOUTH EVERY DAY           Follow-up Information    Follow up with Dalia Heading, MD. Schedule an appointment as soon as possible for a visit on 11/17/2011.   Contact information:   [address] [place] [address] [phone]          Signed: Dalia Heading 11/12/2011, 1:20 PM

## 2011-11-27 ENCOUNTER — Other Ambulatory Visit: Payer: Self-pay | Admitting: Family Medicine

## 2012-01-19 ENCOUNTER — Other Ambulatory Visit: Payer: Self-pay | Admitting: Family Medicine

## 2012-06-07 ENCOUNTER — Ambulatory Visit (INDEPENDENT_AMBULATORY_CARE_PROVIDER_SITE_OTHER): Payer: Self-pay | Admitting: Family Medicine

## 2012-06-07 ENCOUNTER — Encounter: Payer: Self-pay | Admitting: Family Medicine

## 2012-06-07 VITALS — BP 108/70 | HR 70 | Resp 15 | Ht 72.0 in | Wt 178.0 lb

## 2012-06-07 DIAGNOSIS — Z125 Encounter for screening for malignant neoplasm of prostate: Secondary | ICD-10-CM

## 2012-06-07 DIAGNOSIS — R42 Dizziness and giddiness: Secondary | ICD-10-CM

## 2012-06-07 DIAGNOSIS — D509 Iron deficiency anemia, unspecified: Secondary | ICD-10-CM

## 2012-06-07 DIAGNOSIS — I10 Essential (primary) hypertension: Secondary | ICD-10-CM

## 2012-06-07 DIAGNOSIS — R5383 Other fatigue: Secondary | ICD-10-CM

## 2012-06-07 DIAGNOSIS — D649 Anemia, unspecified: Secondary | ICD-10-CM

## 2012-06-07 DIAGNOSIS — D126 Benign neoplasm of colon, unspecified: Secondary | ICD-10-CM

## 2012-06-07 DIAGNOSIS — R5381 Other malaise: Secondary | ICD-10-CM

## 2012-06-07 DIAGNOSIS — Z139 Encounter for screening, unspecified: Secondary | ICD-10-CM

## 2012-06-07 MED ORDER — TRIAMTERENE-HCTZ 37.5-25 MG PO TABS: ORAL_TABLET | ORAL | Status: DC

## 2012-06-07 MED ORDER — OMEPRAZOLE 20 MG PO CPDR: DELAYED_RELEASE_CAPSULE | ORAL | Status: DC

## 2012-06-07 MED ORDER — AMLODIPINE BESYLATE 10 MG PO TABS: ORAL_TABLET | ORAL | Status: DC

## 2012-06-07 NOTE — Assessment & Plan Note (Signed)
Overcorrected dose reduction since symptomatic

## 2012-06-07 NOTE — Progress Notes (Signed)
  Subjective:    Patient ID: Marc Ward, male    DOB: June 01, 1951, 61 y.o.   MRN: [id number]  HPI The PT is here for follow up and re-evaluation of chronic medical conditions, medication management and review of any available recent lab and radiology data.  Preventive health is updated, specifically  Cancer screening and Immunization.Refuses flu vaccine, finances limit zostavx at this time   .Pt had emergency colon resection due to intussucception of small intestine in 10/2011. 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 States while urinating he fell out and hit his elbow the right , against the tub Attempting to get disability still     Review of Systems See HPI Denies recent fever or chills. Denies sinus pressure, nasal congestion, ear pain or sore throat. Denies chest congestion, productive cough or wheezing. Denies chest pains, palpitations and leg swelling Denies abdominal pain, nausea, vomiting,diarrhea or constipation.   Denies dysuria, frequency, hesitancy or incontinence.  Denies headaches, seizures, numbness, or tingling. Denies depression, anxiety or insomnia. Denies skin break down or rash.        Objective:   Physical Exam Patient alert and oriented and in no cardiopulmonary distress.  HEENT: No facial asymmetry, EOMI, no sinus tenderness,  oropharynx pink and moist.  Neck supple no adenopathy.Nom carotid bruit  Chest: Clear to auscultation bilaterally.  CVS: S1, S2 no murmurs, no S3.  ABD: Soft non tender. Bowel sounds normal.  Ext: No edema  MS: Adequate ROM spine, shoulders, hips and knees.  Skin: Intact, no ulcerations or rash noted.  Psych: Good eye contact, normal affect. Memory intact not anxious or depressed appearing.  CNS: CN 2-12 intact, power, tone and  sensation normal throughout.        Assessment & Plan:

## 2012-06-07 NOTE — Patient Instructions (Addendum)
F/u in 3 .5 month, please call if you need   REDUCE blood pressure med  Dose to HALF tablet once daily.I believe you will not be as light headed, and hopefully have no more falls  Fasting cbc, chem 7, lipid, tSH and pSA in the first week in December  You will get a call from Dr Evelina Dun office when she needs to see you  Keep active and maintain a healthy diet

## 2012-06-08 DIAGNOSIS — R42 Dizziness and giddiness: Secondary | ICD-10-CM | POA: Insufficient documentation

## 2012-06-08 NOTE — Assessment & Plan Note (Signed)
2 reported episodes of light headedness with resultant elbow trauma. Blood pressure is low and I believe that this is the main culprit, will reduce med dose and f/u

## 2012-06-08 NOTE — Assessment & Plan Note (Signed)
Follow up with GI referral entered, gI to decide when necessary

## 2012-06-20 LAB — BASIC METABOLIC PANEL
BUN: 19 mg/dL (ref 6–23)
CO2: 31 mEq/L (ref 19–32)
Calcium: 8.9 mg/dL (ref 8.4–10.5)
Chloride: 109 mEq/L (ref 96–112)
Creat: 1.58 mg/dL — ABNORMAL HIGH (ref 0.50–1.35)
Glucose, Bld: 89 mg/dL (ref 70–99)
Potassium: 4.3 mEq/L (ref 3.5–5.3)
Sodium: 148 mEq/L — ABNORMAL HIGH (ref 135–145)

## 2012-06-20 LAB — LIPID PANEL
Cholesterol: 117 mg/dL (ref 0–200)
HDL: 42 mg/dL (ref >39)
LDL Cholesterol: 62 mg/dL (ref 0–99)
Total CHOL/HDL Ratio: 2.8 Ratio
Triglycerides: 66 mg/dL (ref <150)
VLDL: 13 mg/dL (ref 0–40)

## 2012-06-20 LAB — CBC WITH DIFFERENTIAL/PLATELET
Basophils Absolute: 0 10*3/uL (ref 0.0–0.1)
Basophils Relative: 1 % (ref 0–1)
Eosinophils Absolute: 0.2 10*3/uL (ref 0.0–0.7)
Eosinophils Relative: 5 % (ref 0–5)
HCT: 35 % — ABNORMAL LOW (ref 39.0–52.0)
Hemoglobin: 12 g/dL — ABNORMAL LOW (ref 13.0–17.0)
Lymphocytes Relative: 27 % (ref 12–46)
Lymphs Abs: 1.2 10*3/uL (ref 0.7–4.0)
MCH: 32.3 pg (ref 26.0–34.0)
MCHC: 34.3 g/dL (ref 30.0–36.0)
MCV: 94.3 fL (ref 78.0–100.0)
Monocytes Absolute: 0.3 10*3/uL (ref 0.1–1.0)
Monocytes Relative: 6 % (ref 3–12)
Neutro Abs: 2.7 10*3/uL (ref 1.7–7.7)
Neutrophils Relative %: 61 % (ref 43–77)
Platelets: 172 10*3/uL (ref 150–400)
RBC: 3.71 MIL/uL — ABNORMAL LOW (ref 4.22–5.81)
RDW: 13.9 % (ref 11.5–15.5)
WBC: 4.4 10*3/uL (ref 4.0–10.5)

## 2012-06-20 LAB — TSH: TSH: 2.934 u[IU]/mL (ref 0.350–4.500)

## 2012-06-20 LAB — PSA: PSA: 0.78 ng/mL (ref <=4.00)

## 2012-07-07 ENCOUNTER — Encounter: Payer: Self-pay | Admitting: Gastroenterology

## 2012-07-07 ENCOUNTER — Ambulatory Visit (INDEPENDENT_AMBULATORY_CARE_PROVIDER_SITE_OTHER): Payer: Self-pay | Admitting: Gastroenterology

## 2012-07-07 VITALS — BP 99/62 | HR 60 | Temp 97.8°F | Ht 72.0 in | Wt 179.2 lb

## 2012-07-07 DIAGNOSIS — D649 Anemia, unspecified: Secondary | ICD-10-CM

## 2012-07-07 NOTE — Patient Instructions (Signed)
FOLLOW A HIGH IFBER DIET. SEE INFO BELOW.  SEE DR. Lodema Hong FOR BP CHECK WITHIN THE NEXT 7 DAYS.  HOLD YOUR MAXZIDE.  FOLLOW UP IN 6 MOS.   High-Fiber Diet A high-fiber diet changes your normal diet to include more whole grains, legumes, fruits, and vegetables. Changes in the diet involve replacing refined carbohydrates with unrefined foods. The calorie level of the diet is essentially unchanged. The Dietary Reference Intake (recommended amount) for adult males is 38 grams per day. For adult females, it is 25 grams per day. Pregnant and lactating women should consume 28 grams of fiber per day. Fiber is the intact part of a plant that is not broken down during digestion. Functional fiber is fiber that has been isolated from the plant to provide a beneficial effect in the body. PURPOSE  Increase stool bulk.   Ease and regulate bowel movements.   Lower cholesterol.  INDICATIONS THAT YOU NEED MORE FIBER  Constipation and hemorrhoids.   Uncomplicated diverticulosis (intestine condition) and irritable bowel syndrome.   Weight management.   As a protective measure against hardening of the arteries (atherosclerosis), diabetes, and cancer.   DO NOT USE WITH:  Acute diverticulitis (intestine infection).   Partial small bowel obstructions.   Complicated diverticular disease involving bleeding, rupture (perforation), or abscess (boil, furuncle).   Presence of autonomic neuropathy (nerve damage) or gastroparesis (stomach cannot empty itself).    GUIDELINES FOR INCREASING FIBER IN THE DIET  Start adding fiber to the diet slowly. A gradual increase of about 5 more grams (2 slices of whole-wheat bread, 2 servings of most fruits or vegetables, or 1 bowl of high-fiber cereal) per day is best. Too rapid an increase in fiber may result in constipation, flatulence, and bloating.   Drink enough water and fluids to keep your urine clear or pale yellow. Water, juice, or caffeine-free drinks are  recommended. Not drinking enough fluid may cause constipation.   Eat a variety of high-fiber foods rather than one type of fiber.   Try to increase your intake of fiber through using high-fiber foods rather than fiber pills or supplements that contain small amounts of fiber.   The goal is to change the types of food eaten. Do not supplement your present diet with high-fiber foods, but replace foods in your present diet.    INCLUDE A VARIETY OF FIBER SOURCES  Replace refined and processed grains with whole grains, canned fruits with fresh fruits, and incorporate other fiber sources. White rice, white breads, and most bakery goods contain little or no fiber.   Brown whole-grain rice, buckwheat oats, and many fruits and vegetables are all good sources of fiber. These include: broccoli, Brussels sprouts, cabbage, cauliflower, beets, sweet potatoes, white potatoes (skin on), carrots, tomatoes, eggplant, squash, berries, fresh fruits, and dried fruits.   Cereals appear to be the richest source of fiber. Cereal fiber is found in whole grains and bran. Bran is the fiber-rich outer coat of cereal grain, which is largely removed in refining. In whole-grain cereals, the bran remains. In breakfast cereals, the largest amount of fiber is found in those with "bran" in their names. The fiber content is sometimes indicated on the label.   You may need to include additional fruits and vegetables each day.   In baking, for 1 cup white flour, you may use the following substitutions:   1 cup whole-wheat flour minus 2 tablespoons.   1/2 cup white flour plus 1/2 cup whole-wheat flour.

## 2012-07-07 NOTE — Progress Notes (Signed)
  Subjective:    Patient ID: Marc Ward, male    DOB: 11-04-1950, 61 y.o.   MRN: [id number]  PCP: [facility]  HPI Slowed down on activities(recreational). QUESTIONS ABOUT WHEN HE NEEDS HIS NEXT TCS. NO BLOOD  IN STOLL. OR BLACK TARRY STOOLS. PT DENIES FEVER, CHILLS, BRBPR, nausea, vomiting, melena, diarrhea, constipation, abd pain, problems swallowing, heartburn or indigestion. CUT BACK ON BP MEDS DUE TO SYNCOPE.   Past Medical History  Diagnosis Date  . Sickle cell trait   . Hypertension 2009  . H. pylori infection FEB 2011    ABO BID x1O DAYS  . IDA (iron deficiency anemia)     Past Surgical History  Procedure Date  . Tonsillectomy   . Adenoidectomy   . Circumcision   . Colonoscopy AUG 2010    ADVANCED [place] ADENOMA, TICS,   . Upper gastrointestinal endoscopy FEB 2011    H. PYLORI  . Laparotomy 11/08/2011    Procedure: EXPLORATORY LAPAROTOMY;  Surgeon: Dalia Heading, MD;  Location: [facility];  Service: General;  Laterality: N/A;  . Bowel resection 11/08/2011    Procedure: SMALL BOWEL RESECTION;  Surgeon: Dalia Heading, MD;  Location: [facility];  Service: General;  Laterality: N/A;   No Known Allergies  Current Outpatient Prescriptions  Medication Sig Dispense Refill  . amLODipine (NORVASC) 10 MG tablet TAKE ONE TABLET BY MOUTH EVERY DAY FOR BLOOD PRESSURE    . Ferrous Sulfate Dried (FEOSOL) 200 (65 FE) MG TABS Take 1 tablet by mouth daily.     Marland Kitchen omeprazole (PRILOSEC) 20 MG capsule 1 po every morning    . oxyCODONE-acetaminophen (PERCOCET) 7.5-325 MG per tablet Take 1-2 tablets by mouth every 4 (four) hours as needed for pain.    Marland Kitchen triamterene-hydrochlorothiazide (MAXZIDE-25) 37.5-25 MG per tablet Dose reduction effective 06/07/2012 One half tablet once daily         Review of Systems     Objective:   Physical Exam        Assessment & Plan:

## 2012-07-07 NOTE — Progress Notes (Signed)
Faxed to PCP

## 2012-07-07 NOTE — Assessment & Plan Note (Addendum)
MOST LIKELY DUE TO SB ADENOMA. NOW PT'S HB IMPROVED BUT LOW IN SETTING OF INCREASED CREATININE, SICKLE CELL TRAIT.  NO EVIDENCE FOR GIB.  REPEAT CBC/FERRITIN IN 6 MOS. NO NEED FOR ENDOSCOPY AT THIS TIME. HOLD MAXZIDE. RECHECK BP WITH DR. Lodema Hong IN 7 DAYS. OPV IN 6 MOS.

## 2012-07-08 ENCOUNTER — Telehealth: Payer: Self-pay

## 2012-07-08 NOTE — Telephone Encounter (Signed)
Pt needs appt for BP check on Thurs with Dr Lodema Hong. Tried to calll. Left message for pt with date and time and to call back if it was a problem

## 2012-07-08 NOTE — Progress Notes (Signed)
Spoke with [place]. Explained I instructed pt to hold his Maxzide. Dr. Lodema Hong is off today. She will contact pt for BP check next Thur.

## 2012-07-08 NOTE — Telephone Encounter (Signed)
That's fine, pls do

## 2012-07-08 NOTE — Telephone Encounter (Signed)
Dr Darrick Penna called and said that she seen Marc Ward in the office and his systolic BP was 99. She advised him to stop the maxzide and to make appt her for a BP check in 7 days. Will call pt to see if he can come on Thursday.

## 2012-07-18 NOTE — Progress Notes (Signed)
Reminder in epic to have CBC,Ferrintin in 6 months and OPV in 6 months with SF in E30

## 2012-08-15 ENCOUNTER — Ambulatory Visit (INDEPENDENT_AMBULATORY_CARE_PROVIDER_SITE_OTHER): Payer: Self-pay | Admitting: Family Medicine

## 2012-08-15 ENCOUNTER — Encounter: Payer: Self-pay | Admitting: Family Medicine

## 2012-08-15 VITALS — BP 120/80 | HR 68 | Resp 16 | Ht 72.0 in | Wt 174.0 lb

## 2012-08-15 DIAGNOSIS — I10 Essential (primary) hypertension: Secondary | ICD-10-CM

## 2012-08-15 DIAGNOSIS — J209 Acute bronchitis, unspecified: Secondary | ICD-10-CM

## 2012-08-15 MED ORDER — SULFAMETHOXAZOLE-TRIMETHOPRIM 800-160 MG PO TABS: 1.0000 | ORAL_TABLET | Freq: Two times a day (BID) | ORAL | Status: DC

## 2012-08-15 MED ORDER — BENZONATATE 100 MG PO CAPS: ORAL_CAPSULE | ORAL | Status: AC

## 2012-08-15 NOTE — Assessment & Plan Note (Signed)
Controlled, no change in medication DASH diet and commitment to daily physical activity for a minimum of 30 minutes discussed and encouraged, as a part of hypertension management. The importance of attaining a healthy weight is also discussed.  

## 2012-08-15 NOTE — Patient Instructions (Addendum)
F/u in 6 month, call if you need me before.  Blood pressure today is excellent, stay on the one amlodipine tablet once daILY ONLY. \  MEDICATION IS PRESCRIBED FOR ACUTE  BRONCHITIS, TESSALON PERLES AND SEPTRA, YOU WILL GET THE SCRIPT TO TAKE TO THE PHARMACY  Please practice good sleep hygiene, no sound, no lights, and a set bed time, yhis will; improve your sleep.  Commit to daily physical activity, and meditation

## 2012-08-15 NOTE — Progress Notes (Signed)
  Subjective:    Patient ID: Cleaster Corin, male    DOB: 1950/09/02, 62 y.o.   MRN: [id number]  HPI Pt recently had maxzide discontinued with his GI Doc due to low SBP and c/o lightheadedness, he is here for re evaluation of same. Denies further light headedness, feels better. Denies chest pain , PND , orthopnea or leg swelling. 2 week h/o cough and chest congestion, sputum is yellow at times, has had intermittent chills no documented fever, denies ear pain and sore throat   Review of Systems See HPI Denies recent fever or chills. Denies sinus pressure, nasal congestion, ear pain or sore throat. Denies chest pains, palpitations and leg swelling Denies abdominal pain, nausea, vomiting,diarrhea or constipation.   Denies dysuria, frequency, hesitancy or incontinence. Denies joint pain, swelling and limitation in mobility. Denies headaches, seizures, numbness, or tingling. Denies depression, anxiety or insomnia. Denies skin break down or rash.        Objective:   Physical Exam  Patient alert and oriented and in no cardiopulmonary distress.  HEENT: No facial asymmetry, EOMI, no sinus tenderness,  oropharynx pink and moist.  Neck supple no adenopathy.TM clear bilaterally  Chest: Adequate air entry, few scattered crackles, no wheezes  CVS: S1, S2 no murmurs, no S3.  ABD: Soft non tender. Bowel sounds normal.  Ext: No edema  MS: Adequate ROM spine, shoulders, hips and knees.  Skin: Intact, no ulcerations or rash noted.  Psych: Good eye contact, normal affect. Memory intact not anxious or depressed appearing.  CNS: CN 2-12 intact, power, tone and sensation normal throughout.       Assessment & Plan:

## 2012-08-15 NOTE — Assessment & Plan Note (Signed)
Acute symptoms, decongestant and antibiotics prescribed

## 2012-10-05 ENCOUNTER — Ambulatory Visit: Payer: Self-pay | Admitting: Family Medicine

## 2013-01-04 ENCOUNTER — Telehealth: Payer: Self-pay | Admitting: Gastroenterology

## 2013-01-04 ENCOUNTER — Other Ambulatory Visit: Payer: Self-pay

## 2013-01-04 DIAGNOSIS — D509 Iron deficiency anemia, unspecified: Secondary | ICD-10-CM

## 2013-01-04 NOTE — Telephone Encounter (Signed)
Lab orders mailed to pt.

## 2013-01-04 NOTE — Telephone Encounter (Signed)
Pt is on the June recall to have CBC, Ferritin due in 6 months

## 2013-01-16 LAB — CBC WITH DIFFERENTIAL/PLATELET
Basophils Absolute: 0.1 10*3/uL (ref 0.0–0.1)
Basophils Relative: 1 % (ref 0–1)
Eosinophils Relative: 6 % — ABNORMAL HIGH (ref 0–5)
HCT: 31.9 % — ABNORMAL LOW (ref 39.0–52.0)
MCHC: 33.9 g/dL (ref 30.0–36.0)
Monocytes Absolute: 0.4 10*3/uL (ref 0.1–1.0)
Neutro Abs: 2.9 10*3/uL (ref 1.7–7.7)
RDW: 13.9 % (ref 11.5–15.5)

## 2013-01-25 ENCOUNTER — Telehealth: Payer: Self-pay | Admitting: Gastroenterology

## 2013-01-25 NOTE — Telephone Encounter (Signed)
PLEASE CALL PT. HIS IRON STORE ARE NL BUT HIS BLOOD COUNT IS DOWN 1 POINT SINCE DEC 2013. THIS MAY BE DUE TO POOR KIDNEY FUNCTION. HE SHOULD MAKE AN OPV IN AUG 2014 E30 TO DISCUSS MANAGEMENT OF HIS LOW BLOOD COUNT.

## 2013-01-26 NOTE — Telephone Encounter (Signed)
Cc PCP 

## 2013-01-26 NOTE — Telephone Encounter (Signed)
LMOM to call.

## 2013-01-30 NOTE — Telephone Encounter (Signed)
Pt called and was informed. OV on 03/02/2013 at 2:30 PM and he is aware.

## 2013-01-30 NOTE — Telephone Encounter (Signed)
PT HAS OV WITH [facility] 8/14@2 :30 PM  PT IS AWARE OF APPT

## 2013-02-13 ENCOUNTER — Encounter: Payer: Self-pay | Admitting: Family Medicine

## 2013-02-13 ENCOUNTER — Ambulatory Visit (INDEPENDENT_AMBULATORY_CARE_PROVIDER_SITE_OTHER): Payer: Medicaid Other | Admitting: Family Medicine

## 2013-02-13 VITALS — BP 120/82 | HR 57 | Resp 16 | Ht 72.0 in | Wt 180.0 lb

## 2013-02-13 DIAGNOSIS — R5381 Other malaise: Secondary | ICD-10-CM

## 2013-02-13 DIAGNOSIS — L0293 Carbuncle, unspecified: Secondary | ICD-10-CM

## 2013-02-13 DIAGNOSIS — Z125 Encounter for screening for malignant neoplasm of prostate: Secondary | ICD-10-CM

## 2013-02-13 DIAGNOSIS — L0292 Furuncle, unspecified: Secondary | ICD-10-CM

## 2013-02-13 DIAGNOSIS — D509 Iron deficiency anemia, unspecified: Secondary | ICD-10-CM

## 2013-02-13 DIAGNOSIS — I10 Essential (primary) hypertension: Secondary | ICD-10-CM

## 2013-02-13 DIAGNOSIS — Z79899 Other long term (current) drug therapy: Secondary | ICD-10-CM

## 2013-02-13 MED ORDER — ASPIRIN EC 81 MG PO TBEC: 81.0000 mg | DELAYED_RELEASE_TABLET | Freq: Every day | ORAL | Status: AC

## 2013-02-13 NOTE — Patient Instructions (Addendum)
F/u  second week in December with recta, call if you need me before.  Flu vaccine available in October  Blood pressure is excellent no medication changes.   You need to advise your barber not to trim you so closely, you are getting boils because of this  Doxycycline  sent in  For 1 week, keep area clean and covered  Fasting lipid, chem 7 , TSh and PSA December 3 or after  You need aspirin 81 mfg daily to reduce risk of heart disease

## 2013-02-13 NOTE — Assessment & Plan Note (Signed)
Closer attention to not shaving to deeply at nape to be started as boils are occuring in this area. Open lesion dia approx 1.5cm currently present no visible drainage, still needs antibiotic, as larger subcutaneous nodule palpable in area

## 2013-02-13 NOTE — Progress Notes (Signed)
  Subjective:    Patient ID: Marc Ward, male    DOB: June 21, 1951, 62 y.o.   MRN: [id number]  HPI The PT is here for follow up and re-evaluation of chronic medical conditions, medication management and review of any available recent lab and radiology data.  Preventive health is updated, specifically  Cancer screening and Immunization.   Follows with GI regularly due to h/o tubulovillous polyp and anemia. The PT denies any adverse reactions to current medications since the last visit.  C/o recurrent boils on nape of neck, associated with close trim    Review of Systems See HPI Denies recent fever or chills. Denies sinus pressure, nasal congestion, ear pain or sore throat. Denies chest congestion, productive cough or wheezing. Denies chest pains, palpitations and leg swelling Denies abdominal pain, nausea, vomiting,diarrhea or constipation.   Denies dysuria, frequency, hesitancy or incontinence. Denies joint pain, swelling and limitation in mobility. Denies headaches, seizures, numbness, or tingling. Denies depression, anxiety or insomnia.However bored as unemployed with "nothing to do"         Objective:   Physical Exam  Patient alert and oriented and in no cardiopulmonary distress.  HEENT: No facial asymmetry, EOMI, no sinus tenderness,  oropharynx pink and moist.  Neck supple no adenopathy.  Chest: Clear to auscultation bilaterally.  CVS: S1, S2 no murmurs, no S3.  ABD: Soft non tender. Bowel sounds normal.  Ext: No edema  MS: Adequate ROM spine, shoulders, hips and knees.  Skin: open ulcer posterior right neck diameter approx 1.5cn Psych: Good eye contact, normal affect. Memory intact not anxious or depressed appearing.  CNS: CN 2-12 intact, power, tone and sensation normal throughout.       Assessment & Plan:

## 2013-02-13 NOTE — Assessment & Plan Note (Signed)
Controlled, no change in medication  

## 2013-02-15 MED ORDER — DOXYCYCLINE MONOHYDRATE 100 MG PO CAPS: 100.0000 mg | ORAL_CAPSULE | Freq: Two times a day (BID) | ORAL | Status: DC

## 2013-02-15 NOTE — Addendum Note (Signed)
Addended by: Kandis Fantasia B on: 02/15/2013 05:18 PM   Modules accepted: Orders

## 2013-03-02 ENCOUNTER — Encounter: Payer: Self-pay | Admitting: Gastroenterology

## 2013-03-02 ENCOUNTER — Ambulatory Visit (INDEPENDENT_AMBULATORY_CARE_PROVIDER_SITE_OTHER): Payer: Medicaid Other | Admitting: Gastroenterology

## 2013-03-02 VITALS — BP 113/75 | HR 69 | Temp 97.2°F | Ht 72.0 in | Wt 179.8 lb

## 2013-03-02 DIAGNOSIS — D649 Anemia, unspecified: Secondary | ICD-10-CM

## 2013-03-02 NOTE — Progress Notes (Signed)
  Subjective:    Patient ID: Marc Ward, male    DOB: 01-24-1951, 62 y.o.   MRN: [id number]  Marc Overman, MD  HPI No stroke or mi. Taking ASA daily. NO MELENA, BRBPR, NOSE BLEEDS, OR HEMATEMESIS/HEOPTYSIS. BMs: MAY HAVE BM Q 1-2 DAYS.   PT DENIES FEVER, CHILLS, nausea, vomiting, diarrhea, constipation, abd pain, problems swallowing, OR heartburn or indigestion.  Past Medical History  Diagnosis Date  . Sickle cell trait   . Hypertension 2009  . H. pylori infection FEB 2011    ABO BID x1O DAYS  . IDA (iron deficiency anemia)    Past Surgical History  Procedure Laterality Date  . Tonsillectomy    . Adenoidectomy    . Circumcision    . Colonoscopy  AUG 2010    ADVANCED [place] ADENOMA, TICS,   . Upper gastrointestinal endoscopy  FEB 2011    H. PYLORI  . Laparotomy  11/08/2011    Procedure: EXPLORATORY LAPAROTOMY;  Surgeon: Dalia Heading, MD;  Location: [facility];  Service: General;  Laterality: N/A;  . Bowel resection  11/08/2011    Procedure: SMALL BOWEL RESECTION;  Surgeon: Dalia Heading, MD;  Location: [facility];  Service: General;  Laterality: N/A;   No Known Allergies  Current Outpatient Prescriptions  Medication Sig Dispense Refill  . amLODipine (NORVASC) 10 MG tablet TAKE ONE TABLET BY MOUTH EVERY DAY FOR BLOOD PRESSURE    . aspirin EC 81 MG tablet Take 1 tablet (81 mg total) by mouth daily.    . Ferrous Sulfate Dried (FEOSOL) 200 (65 FE) MG TABS Take 1 tablet by mouth daily.     Marland Kitchen omeprazole (PRILOSEC) 20 MG capsule 1 po every morning       Review of Systems     Objective:   Physical Exam        Assessment & Plan:

## 2013-03-02 NOTE — Patient Instructions (Signed)
GET YOUR LABS DRAWN.  I WILL CALL YOU WITH THE RESULTS.  FOLLOW UP IN 6 MOS. MERRY CHRISTMAS AND HAPPY NEW YEAR!

## 2013-03-02 NOTE — Progress Notes (Signed)
CC'd to PCP 

## 2013-03-02 NOTE — Assessment & Plan Note (Signed)
HB 10-11.5 SINCE 2012. FERRITIN UP FROM 2012. CR UP DEC 2013. MOST LIKELY DUE TO CRI/SICKLE CELL TRAIT.  RECHECK BMP/B12/RBC FOLATE. CONSIDER NEPHROLOGY REFERRAL IF CR ELEVATED OPV IN 6 MOS.

## 2013-03-03 NOTE — Progress Notes (Signed)
CC'd to PCP 

## 2013-03-10 ENCOUNTER — Other Ambulatory Visit: Payer: Self-pay | Admitting: Gastroenterology

## 2013-03-11 LAB — BASIC METABOLIC PANEL
BUN: 14 mg/dL (ref 6–23)
CO2: 27 mEq/L (ref 19–32)
Chloride: 106 mEq/L (ref 96–112)
Potassium: 3.6 mEq/L (ref 3.5–5.3)

## 2013-03-13 NOTE — Progress Notes (Addendum)
PLEASE CALL PT. HIS B12 LEVEL IS NORMAL. HE HAS REDUCED KIDNEY FUNCTION.Marland Kitchen HE SHOULD SEE DR. Lodema Hong TO DISCUSS THESE RESULTS. OPC W/ SLF IN 6 MOS E15 DX: ANEMIA.

## 2013-03-14 ENCOUNTER — Other Ambulatory Visit: Payer: Self-pay | Admitting: Gastroenterology

## 2013-03-14 NOTE — Progress Notes (Signed)
Pt on recall list for 6 months.

## 2013-03-14 NOTE — Progress Notes (Signed)
Called and informed pt.  

## 2013-03-16 LAB — FOLATE RBC

## 2013-03-17 ENCOUNTER — Other Ambulatory Visit: Payer: Self-pay | Admitting: Gastroenterology

## 2013-03-30 ENCOUNTER — Ambulatory Visit (INDEPENDENT_AMBULATORY_CARE_PROVIDER_SITE_OTHER): Payer: Medicaid Other | Admitting: Family Medicine

## 2013-03-30 ENCOUNTER — Encounter: Payer: Self-pay | Admitting: Family Medicine

## 2013-03-30 VITALS — BP 118/78 | HR 56 | Resp 16 | Wt 179.0 lb

## 2013-03-30 DIAGNOSIS — N182 Chronic kidney disease, stage 2 (mild): Secondary | ICD-10-CM | POA: Insufficient documentation

## 2013-03-30 DIAGNOSIS — N189 Chronic kidney disease, unspecified: Secondary | ICD-10-CM

## 2013-03-30 DIAGNOSIS — Z23 Encounter for immunization: Secondary | ICD-10-CM

## 2013-03-30 DIAGNOSIS — I10 Essential (primary) hypertension: Secondary | ICD-10-CM

## 2013-03-30 NOTE — Progress Notes (Signed)
  Subjective:    Patient ID: Marc Ward, male    DOB: 1950-11-04, 62 y.o.   MRN: [id number]  HPI Pt in at the direction of his GI Doc due to elevation in his creatinine for further investigation of renal function Hypertension is his only underlying medical condition that  may be contributing to this. He feels well with no concerns. Reports rgular voiding with normal urine stream   Review of Systems See HPI Denies recent fever or chills. Denies sinus pressure, nasal congestion, ear pain or sore throat. Denies chest congestion, productive cough or wheezing. Denies chest pains, palpitations and leg swelling Denies abdominal pain, nausea, vomiting,diarrhea or constipation.   Denies dysuria, frequency, hesitancy or incontinence.       Objective:   Physical Exam  Patient alert and oriented and in no cardiopulmonary distress.  HEENT: No facial asymmetry, EOMI, no sinus tenderness,  oropharynx pink and moist.  Neck supple no adenopathy.  Chest: Clear to auscultation bilaterally.  CVS: S1, S2 no murmurs, no S3.  ABD: Soft non tender. Bowel sounds normal.No flank tenderness or palpable mass  Ext: No edema  MS: Adequate ROM spine, shoulders, hips and knees.  Skin: Intact, no ulcerations or rash noted.  Psych: Good eye contact, normal affect. Memory intact not anxious or depressed appearing.  CNS: CN 2-12 intact, power, tone and sensation normal throughout.       Assessment & Plan:

## 2013-03-30 NOTE — Patient Instructions (Addendum)
F/u as before.  You are referred for kidney ultrasound , also to see kidney a specialist, Dr Alferd Apa.  Flu vaccine today  Check [facility] pharmacy for shingles vaccine you need this

## 2013-04-04 ENCOUNTER — Ambulatory Visit (HOSPITAL_COMMUNITY)
Admission: RE | Admit: 2013-04-04 | Discharge: 2013-04-04 | Disposition: A | Discharge status: Home / Self Care | Payer: Medicaid Other | Source: Ambulatory Visit | Attending: Family Medicine | Admitting: Family Medicine

## 2013-04-04 DIAGNOSIS — I10 Essential (primary) hypertension: Secondary | ICD-10-CM | POA: Insufficient documentation

## 2013-04-04 DIAGNOSIS — N189 Chronic kidney disease, unspecified: Secondary | ICD-10-CM | POA: Insufficient documentation

## 2013-04-24 ENCOUNTER — Other Ambulatory Visit (HOSPITAL_COMMUNITY): Payer: Self-pay | Admitting: Nephrology

## 2013-04-24 DIAGNOSIS — N289 Disorder of kidney and ureter, unspecified: Secondary | ICD-10-CM

## 2013-04-24 NOTE — Assessment & Plan Note (Signed)
Pt referred for renal US and also to nephrologist

## 2013-04-24 NOTE — Assessment & Plan Note (Signed)
Controlled, no change in medication  

## 2013-05-05 ENCOUNTER — Ambulatory Visit (HOSPITAL_COMMUNITY): Payer: Medicaid Other

## 2013-06-16 ENCOUNTER — Other Ambulatory Visit: Payer: Self-pay | Admitting: Family Medicine

## 2013-06-23 ENCOUNTER — Encounter (INDEPENDENT_AMBULATORY_CARE_PROVIDER_SITE_OTHER): Payer: Self-pay

## 2013-06-23 ENCOUNTER — Encounter: Payer: Self-pay | Admitting: Family Medicine

## 2013-06-23 ENCOUNTER — Ambulatory Visit (HOSPITAL_COMMUNITY)
Admission: RE | Admit: 2013-06-23 | Discharge: 2013-06-23 | Disposition: A | Discharge status: Home / Self Care | Payer: Medicaid Other | Source: Ambulatory Visit | Attending: Family Medicine | Admitting: Family Medicine

## 2013-06-23 ENCOUNTER — Ambulatory Visit (INDEPENDENT_AMBULATORY_CARE_PROVIDER_SITE_OTHER): Payer: Medicaid Other | Admitting: Family Medicine

## 2013-06-23 VITALS — BP 150/100 | HR 65 | Temp 98.6°F | Resp 16 | Ht 72.0 in | Wt 180.1 lb

## 2013-06-23 DIAGNOSIS — F1490 Cocaine use, unspecified, uncomplicated: Secondary | ICD-10-CM

## 2013-06-23 DIAGNOSIS — R0602 Shortness of breath: Secondary | ICD-10-CM | POA: Insufficient documentation

## 2013-06-23 DIAGNOSIS — F1410 Cocaine abuse, uncomplicated: Secondary | ICD-10-CM

## 2013-06-23 DIAGNOSIS — R05 Cough: Secondary | ICD-10-CM | POA: Insufficient documentation

## 2013-06-23 DIAGNOSIS — J209 Acute bronchitis, unspecified: Secondary | ICD-10-CM

## 2013-06-23 DIAGNOSIS — I10 Essential (primary) hypertension: Secondary | ICD-10-CM

## 2013-06-23 DIAGNOSIS — N182 Chronic kidney disease, stage 2 (mild): Secondary | ICD-10-CM

## 2013-06-23 DIAGNOSIS — N189 Chronic kidney disease, unspecified: Secondary | ICD-10-CM

## 2013-06-23 DIAGNOSIS — L0292 Furuncle, unspecified: Secondary | ICD-10-CM

## 2013-06-23 DIAGNOSIS — R059 Cough, unspecified: Secondary | ICD-10-CM | POA: Insufficient documentation

## 2013-06-23 LAB — BASIC METABOLIC PANEL
Chloride: 109 mEq/L (ref 96–112)
Potassium: 4 mEq/L (ref 3.5–5.3)
Sodium: 147 mEq/L — ABNORMAL HIGH (ref 135–145)

## 2013-06-23 LAB — LIPID PANEL
Cholesterol: 111 mg/dL (ref 0–200)
LDL Cholesterol: 56 mg/dL (ref 0–99)
VLDL: 15 mg/dL (ref 0–40)

## 2013-06-23 MED ORDER — BENZONATATE 100 MG PO CAPS: 100.0000 mg | ORAL_CAPSULE | Freq: Three times a day (TID) | ORAL | Status: AC | PRN

## 2013-06-23 MED ORDER — LEVOFLOXACIN 500 MG PO TABS: 500.0000 mg | ORAL_TABLET | Freq: Every day | ORAL | Status: AC

## 2013-06-23 NOTE — Progress Notes (Signed)
   Subjective:    Patient ID: Marc Ward, male    DOB: 1951-05-19, 62 y.o.   MRN: [id number]  HPI 2 week h/o chest congestion, progressively worsening with intermittent fever and chills, sputum is yellow. C/o 1 week h/o boil on right posterior neck,no drainage. 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 Re education re need to quit to reduce risk of further organ damage including MI   Review of Systems See HPI Denies recent fever or chills. Denies sinus pressure, nasal congestion, ear pain or sore throat. . Denies chest pains, palpitations and leg swelling Denies abdominal pain, nausea, vomiting,diarrhea or constipation.   Denies dysuria, frequency, hesitancy or incontinence. Denies joint pain, swelling and limitation in mobility. Denies headaches, seizures, numbness, or tingling. Denies depression, anxiety or insomnia. Denies skin break down or rash.        Objective:   Physical Exam Patient alert and oriented and in no cardiopulmonary distress.  HEENT: No facial asymmetry, EOMI, no sinus tenderness,  oropharynx pink and moist.  Neck supple no adenopathy.  Chest: decreased aire entry, bibasilar crackles, no wheezes.  CVS: S1, S2 no murmurs, no S3.  ABD: Soft non tender. Bowel sounds normal.  Ext: No edema  MS: Adequate ROM spine, shoulders, hips and knees.  Skin: Intact, no ulcerations or rash noted.  Psych: Good eye contact, normal affect. Memory intact not anxious or depressed appearing.  CNS: CN 2-12 intact, power, tone and sensation normal throughout.        Assessment & Plan:

## 2013-06-23 NOTE — Patient Instructions (Signed)
F/u mid February, call if you need me before  You are being treated for acute bronchitis, decongestants and antibiotics are sent to your pharmacy  CXR today   Please work on changing habits to protect health

## 2013-06-24 LAB — PSA: PSA: 0.62 ng/mL (ref <=4.00)

## 2013-06-25 ENCOUNTER — Encounter: Payer: Self-pay | Admitting: Family Medicine

## 2013-06-25 DIAGNOSIS — F1490 Cocaine use, unspecified, uncomplicated: Secondary | ICD-10-CM | POA: Insufficient documentation

## 2013-06-25 NOTE — Assessment & Plan Note (Signed)
Decongestant and antibiotic prescribed 

## 2013-06-25 NOTE — Assessment & Plan Note (Signed)
Elevated today, pos cocaine use in recent time is the contributor

## 2013-06-25 NOTE — Assessment & Plan Note (Signed)
Being followed by nephrology, pt admitted cocaine use , and this is likely underlying pathology

## 2013-06-25 NOTE — Assessment & Plan Note (Signed)
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

## 2013-06-25 NOTE — Assessment & Plan Note (Signed)
Folliculitis present on right posterior neck, levaquin will treat

## 2013-07-03 ENCOUNTER — Ambulatory Visit: Payer: Medicaid Other | Admitting: Family Medicine

## 2013-07-27 ENCOUNTER — Encounter: Payer: Self-pay | Admitting: Family Medicine

## 2013-07-27 ENCOUNTER — Ambulatory Visit (INDEPENDENT_AMBULATORY_CARE_PROVIDER_SITE_OTHER): Payer: Medicaid Other | Admitting: Family Medicine

## 2013-07-27 ENCOUNTER — Encounter (INDEPENDENT_AMBULATORY_CARE_PROVIDER_SITE_OTHER): Payer: Self-pay

## 2013-07-27 VITALS — BP 130/80 | HR 60 | Resp 18 | Ht 72.0 in | Wt 184.0 lb

## 2013-07-27 DIAGNOSIS — J029 Acute pharyngitis, unspecified: Secondary | ICD-10-CM

## 2013-07-27 DIAGNOSIS — I10 Essential (primary) hypertension: Secondary | ICD-10-CM

## 2013-07-27 LAB — POCT RAPID STREP A (OFFICE): RAPID STREP A SCREEN: NEGATIVE

## 2013-07-27 MED ORDER — FIRST-DUKES MOUTHWASH MT SUSP: OROMUCOSAL | Status: DC

## 2013-07-27 MED ORDER — PENICILLIN V POTASSIUM 500 MG PO TABS: 500.0000 mg | ORAL_TABLET | Freq: Three times a day (TID) | ORAL | Status: DC

## 2013-07-27 NOTE — Patient Instructions (Addendum)
F/u as before  You have no bacterial infection when tested for strep, but you  Do have a sore place in the back of your throat.  Penicillin and Dukes have been prescribed and sent in

## 2013-07-30 NOTE — Assessment & Plan Note (Signed)
10 day course of PCN and mouthwash prescribed Rapid strep is negative

## 2013-07-30 NOTE — Assessment & Plan Note (Signed)
Controlled, no change in medication  

## 2013-07-30 NOTE — Progress Notes (Signed)
   Subjective:    Patient ID: Marc Ward, male    DOB: August 05, 1950, 63 y.o.   MRN: [id number]  HPI 2 day h/o sore throat , denies fever , chills, ear pain , nasal drainage or productive.Denies sinnus pressure or increased sinus drainage History of increased fatigue with symptom onset, no known strep contact   Review of Systems See HPI Denies chest pains, palpitations and leg swelling Denies abdominal pain, nausea, vomiting,diarrhea or constipation.   Denies dysuria, frequency, hesitancy or incontinence.  Denies depression, anxiety or insomnia. Denies skin break down or rash.        Objective:   Physical Exam  Patient alert and oriented and in no cardiopulmonary distress.  HEENT: No facial asymmetry, EOMI, no sinus tenderness,  oropharynx pink and moist.Ulcer noted in posterior left pharynx,   Neck supple nleft anterior cervical adenopathy.  Chest: Clear to auscultation bilaterally.  CVS: S1, S2 no murmurs, no S3.  ABD: Soft non tender. Bowel sounds normal.  Ext: No edema  MS: Adequate ROM spine, shoulders, hips and knees.       Assessment & Plan:

## 2013-08-16 ENCOUNTER — Encounter: Payer: Self-pay | Admitting: Gastroenterology

## 2013-08-18 ENCOUNTER — Other Ambulatory Visit: Payer: Self-pay | Admitting: Family Medicine

## 2013-09-08 ENCOUNTER — Ambulatory Visit: Payer: Medicaid Other | Admitting: Family Medicine

## 2013-09-11 ENCOUNTER — Encounter (INDEPENDENT_AMBULATORY_CARE_PROVIDER_SITE_OTHER): Payer: Self-pay

## 2013-09-11 ENCOUNTER — Ambulatory Visit (INDEPENDENT_AMBULATORY_CARE_PROVIDER_SITE_OTHER): Payer: Medicaid Other | Admitting: Family Medicine

## 2013-09-11 ENCOUNTER — Encounter: Payer: Self-pay | Admitting: Family Medicine

## 2013-09-11 VITALS — BP 148/92 | HR 64 | Resp 18 | Ht 72.0 in | Wt 183.0 lb

## 2013-09-11 DIAGNOSIS — I10 Essential (primary) hypertension: Secondary | ICD-10-CM

## 2013-09-11 MED ORDER — LISINOPRIL 20 MG PO TABS: 20.0000 mg | ORAL_TABLET | Freq: Every day | ORAL | Status: DC

## 2013-09-11 NOTE — Patient Instructions (Signed)
F/u in 2.5 month, call if you need me before  Blood pressure slightly high, increase  Medication you have to TWO daily, the new pill is 20mg  oNE daily  Non fasting chem 7  In 2.5 month , 3 to 5 days before visit  Commit to daily aerobic exercise for 30 mins  .DASH Diet The DASH diet stands for "Dietary Approaches to Stop Hypertension." It is a healthy eating plan that has been shown to reduce high blood pressure (hypertension) in as little as 14 days, while also possibly providing other significant health benefits. These other health benefits include reducing the risk of breast cancer after menopause and reducing the risk of type 2 diabetes, heart disease, colon cancer, and stroke. Health benefits also include weight loss and slowing kidney failure in patients with chronic kidney disease.  DIET GUIDELINES  Limit salt (sodium). Your diet should contain less than 1500 mg of sodium daily.  Limit refined or processed carbohydrates. Your diet should include mostly whole grains. Desserts and added sugars should be used sparingly.  Include small amounts of heart-healthy fats. These types of fats include nuts, oils, and tub margarine. Limit saturated and trans fats. These fats have been shown to be harmful in the body. CHOOSING FOODS  The following food groups are based on a 2000 calorie diet. See your Registered Dietitian for individual calorie needs. Grains and Grain Products (6 to 8 servings daily)  Eat More Often: Whole-wheat bread, brown rice, whole-grain or wheat pasta, quinoa, popcorn without added fat or salt (air popped).  Eat Less Often: White bread, white pasta, white rice, cornbread. Vegetables (4 to 5 servings daily)  Eat More Often: Fresh, frozen, and canned vegetables. Vegetables may be raw, steamed, roasted, or grilled with a minimal amount of fat.  Eat Less Often/Avoid: Creamed or fried vegetables. Vegetables in a cheese sauce. Fruit (4 to 5 servings daily)  Eat More Often:  All fresh, canned (in natural juice), or frozen fruits. Dried fruits without added sugar. One hundred percent fruit juice ( cup [237 mL] daily).  Eat Less Often: Dried fruits with added sugar. Canned fruit in light or heavy syrup. [employer], Fish, and Poultry (2 servings or less daily. One serving is 3 to 4 oz [85-114 g]).  Eat More Often: Ninety percent or leaner ground beef, tenderloin, sirloin. Round cuts of beef, chicken breast, [place] breast. All fish. Grill, bake, or broil your meat. Nothing should be fried.  Eat Less Often/Avoid: Fatty cuts of meat, [place], or chicken leg, thigh, or wing. Fried cuts of meat or fish. Dairy (2 to 3 servings)  Eat More Often: Low-fat or fat-free milk, low-fat plain or light yogurt, reduced-fat or part-skim cheese.  Eat Less Often/Avoid: Milk (whole, 2%).Whole milk yogurt. Full-fat cheeses. Nuts, Seeds, and Legumes (4 to 5 servings per week)  Eat More Often: All without added salt.  Eat Less Often/Avoid: Salted nuts and seeds, canned beans with added salt. Fats and Sweets (limited)  Eat More Often: Vegetable oils, tub margarines without trans fats, sugar-free gelatin. Mayonnaise and salad dressings.  Eat Less Often/Avoid: Coconut oils, palm oils, butter, stick margarine, cream, half and half, cookies, candy, pie. FOR MORE INFORMATION The Dash Diet Eating Plan: www.dashdiet.org Document Released: 06/25/2011 Document Revised: 09/28/2011 Document Reviewed: 06/25/2011 [facility] Patient Information 2014 [place], [place].

## 2013-09-12 NOTE — Progress Notes (Signed)
   Subjective:    Patient ID: Marc Ward, male    DOB: [birth year], 63 y.o.   MRN: [id number]  HPI Pt in for re eval of uncontrolled HTN Reports salty diet, states he had  Shrimps and steak for breakfast earlier today. No complaints  Voiced, an denies recent use of cocaine   Review of Systems See HPI Denies recent fever or chills. Denies sinus pressure, nasal congestion, ear pain or sore throat. Denies chest congestion, productive cough or wheezing. Denies chest pains, palpitations and leg swelling   Denies depression, anxiety or insomnia. Denies skin break down or rash.        Objective:   Physical Exam  BP 148/92  Pulse 64  Resp 18  Ht 6' (1.829 m)  Wt 183 lb (83.008 kg)  BMI 24.81 kg/m2  SpO2 99% Patient alert and oriented and in no cardiopulmonary distress.  HEENT: No facial asymmetry, EOMI, no sinus tenderness,  oropharynx pink and moist.  Neck supple no adenopathy.  Chest: Clear to auscultation bilaterally.  CVS: S1, S2 no murmurs, no S3.  ABD: Soft non tender. Bowel sounds normal.  Ext: No edema  MS: Adequate ROM spine, shoulders, hips and knees.  Skin: Intact, no ulcerations or rash noted.  Psych: Good eye contact, normal affect. Memory intact not anxious or depressed appearing.  CNS: CN 2-12 intact, power, tone and sensation normal throughout.       Assessment & Plan:  Essential hypertension Uncontrolled dose inc in medication DASH diet and commitment to daily physical activity for a minimum of 30 minutes discussed and encouraged, as a part of hypertension management. The importance of attaining a healthy weight is also discussed.

## 2013-09-12 NOTE — Assessment & Plan Note (Signed)
Uncontrolled dose inc in medication DASH diet and commitment to daily physical activity for a minimum of 30 minutes discussed and encouraged, as a part of hypertension management. The importance of attaining a healthy weight is also discussed.

## 2014-01-14 ENCOUNTER — Other Ambulatory Visit: Payer: Self-pay | Admitting: Family Medicine

## 2014-02-16 ENCOUNTER — Other Ambulatory Visit: Payer: Self-pay | Admitting: Family Medicine

## 2014-04-13 ENCOUNTER — Other Ambulatory Visit: Payer: Self-pay

## 2014-04-13 MED ORDER — LISINOPRIL 20 MG PO TABS: ORAL_TABLET | ORAL | Status: DC

## 2014-06-21 ENCOUNTER — Ambulatory Visit: Payer: Medicare Other

## 2014-06-21 ENCOUNTER — Ambulatory Visit (INDEPENDENT_AMBULATORY_CARE_PROVIDER_SITE_OTHER): Payer: Medicare Other

## 2014-06-21 DIAGNOSIS — Z23 Encounter for immunization: Secondary | ICD-10-CM | POA: Diagnosis not present

## 2014-07-31 ENCOUNTER — Ambulatory Visit: Payer: Medicaid Other | Admitting: Family Medicine

## 2014-07-31 IMAGING — US US RENAL
1 series · 14 of 25 positions shown · non-contrast
Comparison: CT 11/08/2011.

CLINICAL DATA: History of chronic renal disease and hypertension.

RENAL/URINARY TRACT ULTRASOUND COMPLETE

[Series 1: us renal · 0.22mm/px · 14 of 35 slices shown]
[im 1/35]
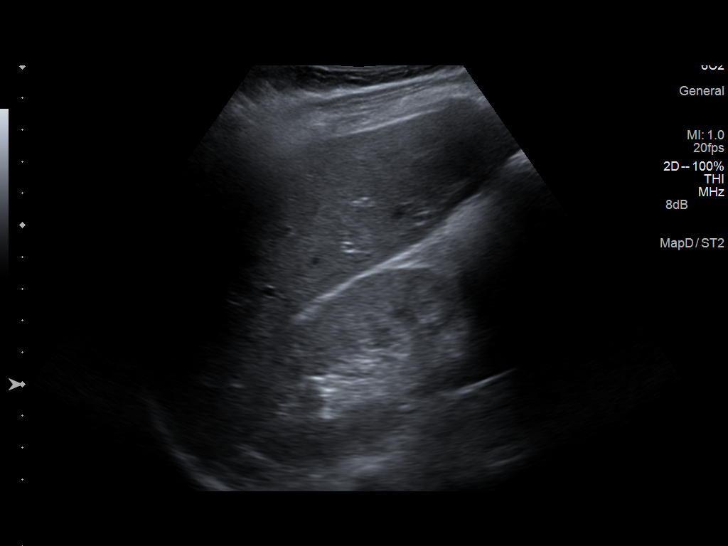
[im 3/35]
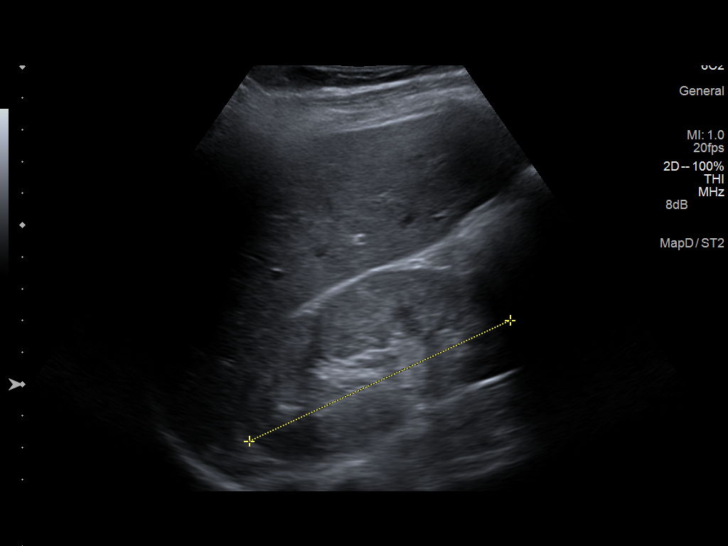
[im 6/35]
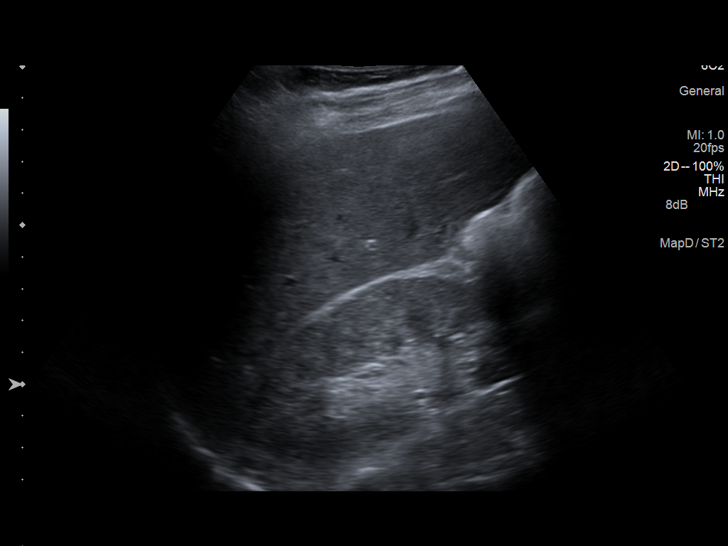
[im 9/35]
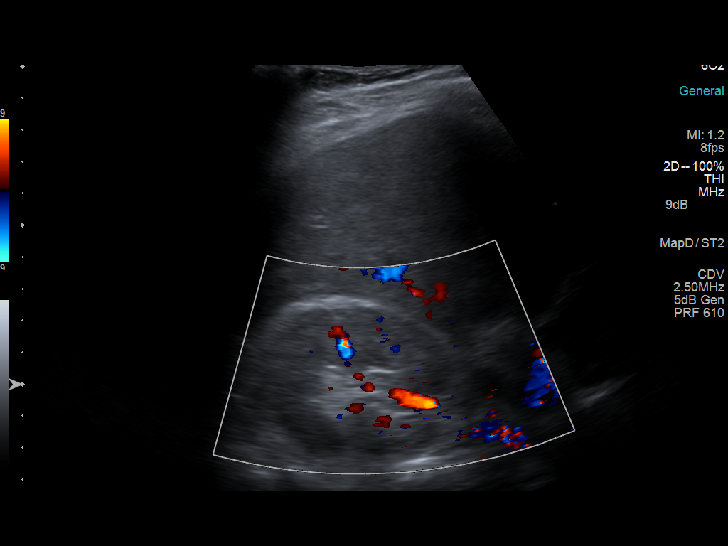
[im 12/35]
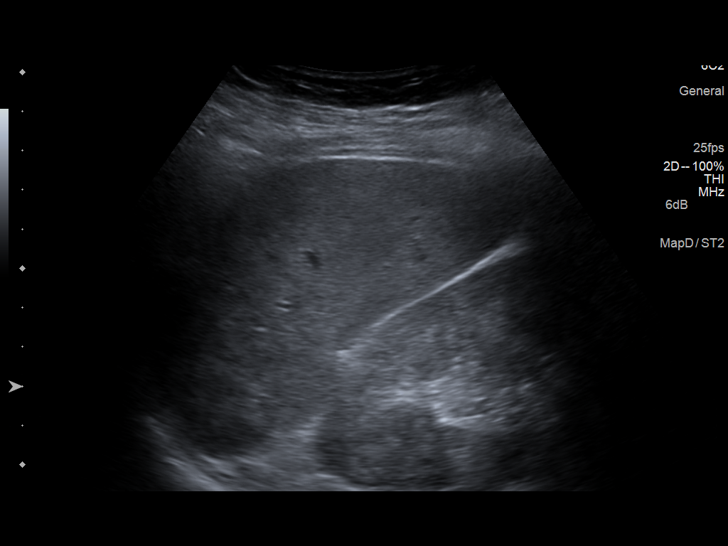
[im 13/35]
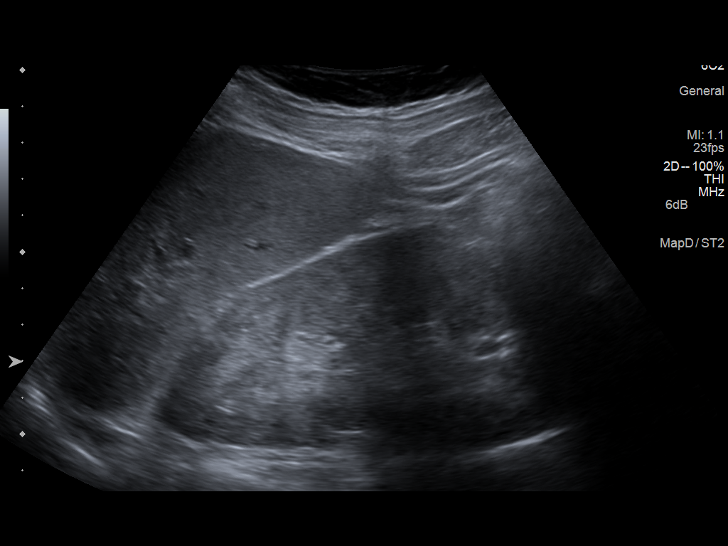
[im 16/35]
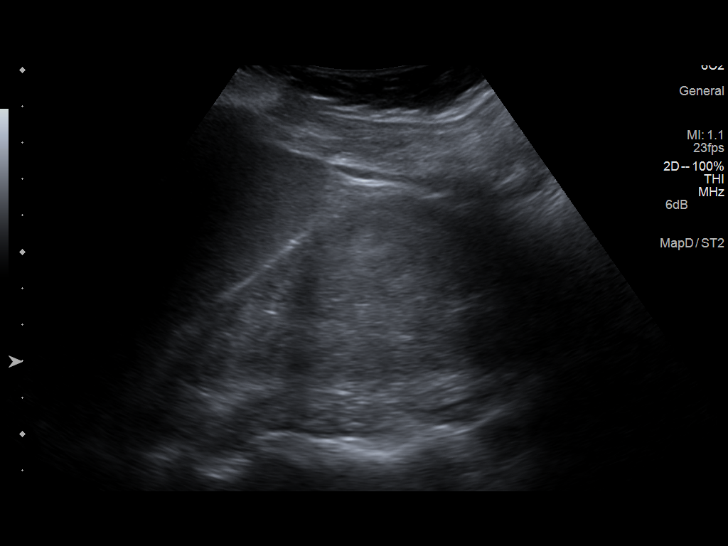
[im 19/35]
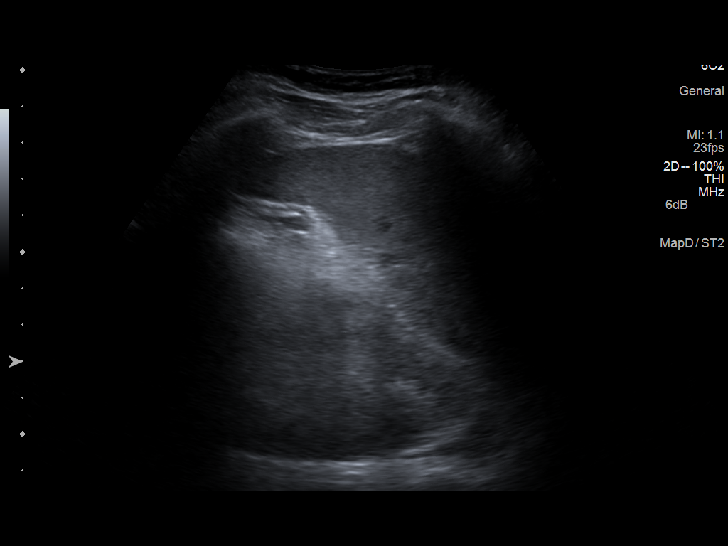
[im 22/35]
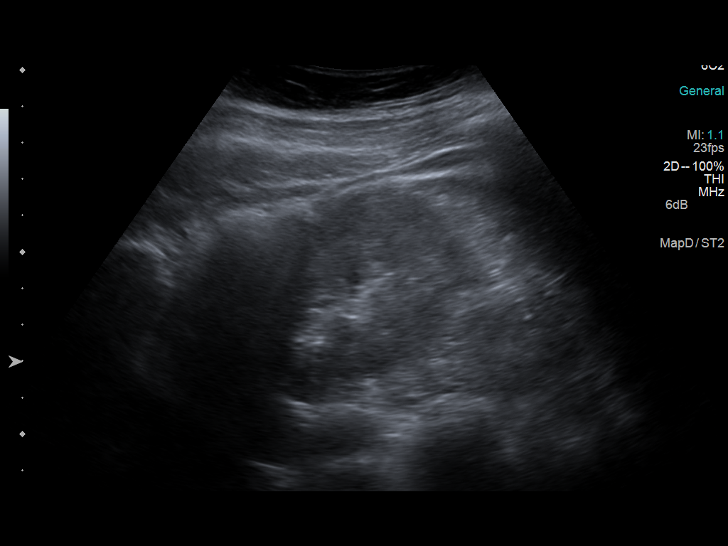
[im 23/35]
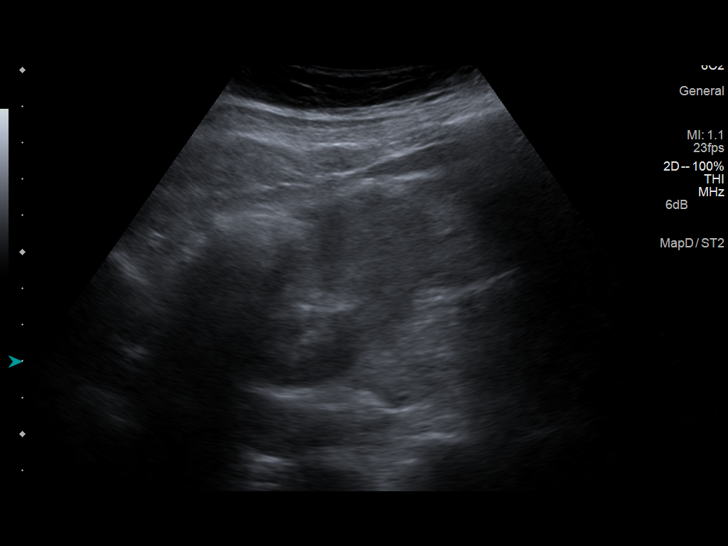
[im 26/35]
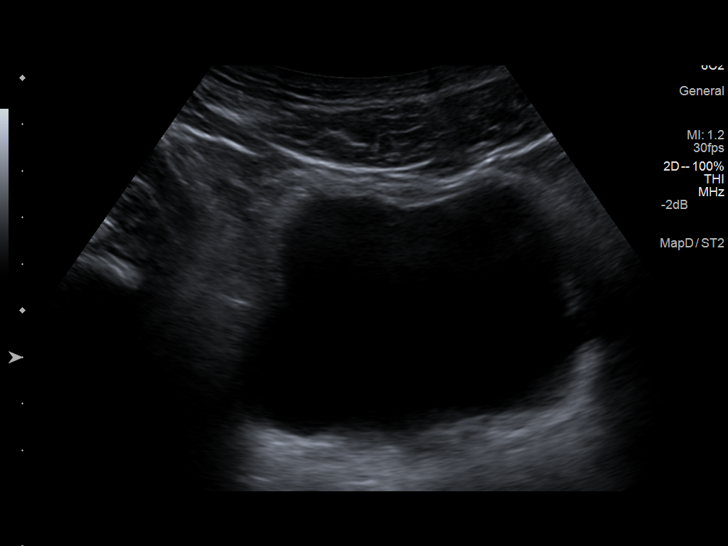
[im 29/35]
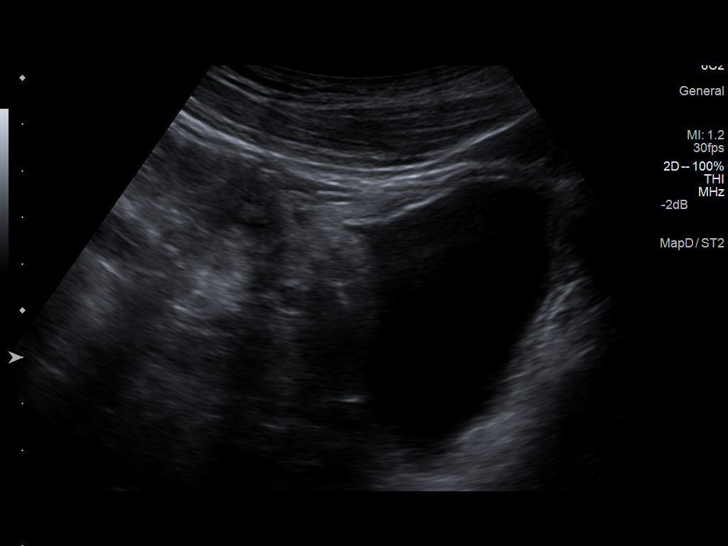
[im 32/35]
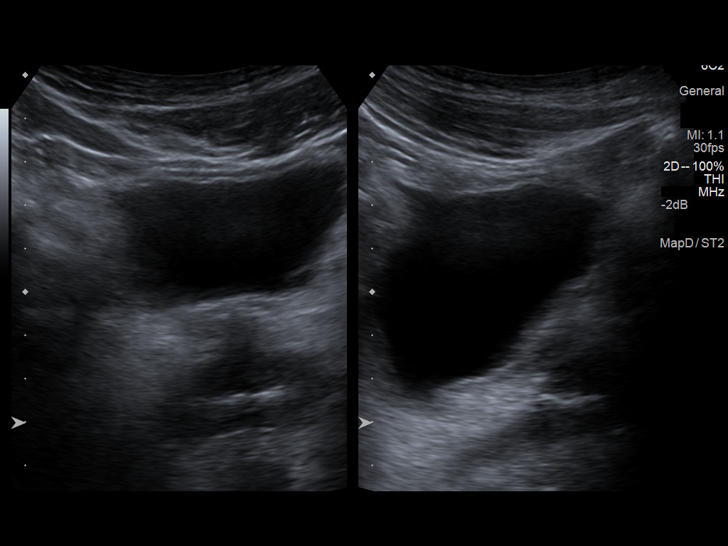
[im 35/35]
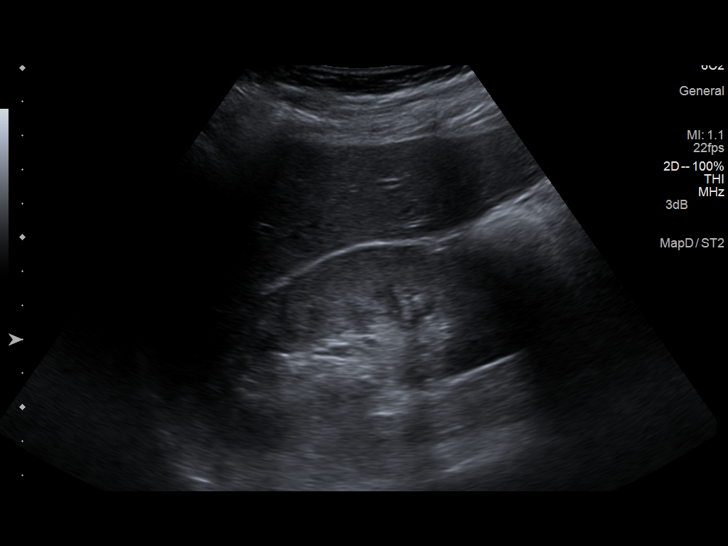

[14 of 25 positions shown; findings below may reference images not displayed]

FINDINGS: Right Kidney:  Right renal length is 10.4 cm.

Left Kidney:  Left renal length is 11.6 cm.

Examination of each kidney shows no evidence of hydronephrosis,
solid or cystic mass, calculus, or significant parenchymal loss.
There may be very slight increased echogenicity of renal parenchyma
suggesting medical renal disease.

Bladder:  No urinary bladder abnormality is evident.

Prostate measures 4.2 x 2.6 x 1.9 cm with volume of 11 cm.
IMPRESSION: History of chronic renal disease and hypertension.  There may be
very slight increased echogenicity of renal parenchyma but it is
minimal.  Kidneys are normal size with no hydronephrosis or mass is
evident.  No calculi are seen.  No urinary bladder abnormality is
seen.

Prostate measures 4.2 x 2.6 x 1.9 cm with volume of 11 cm.

## 2014-08-03 ENCOUNTER — Encounter: Payer: Self-pay | Admitting: Family Medicine

## 2014-08-03 ENCOUNTER — Ambulatory Visit (INDEPENDENT_AMBULATORY_CARE_PROVIDER_SITE_OTHER): Payer: Medicare Other | Admitting: Family Medicine

## 2014-08-03 VITALS — BP 150/100 | HR 73 | Resp 16 | Ht 72.0 in | Wt 179.1 lb

## 2014-08-03 DIAGNOSIS — I10 Essential (primary) hypertension: Secondary | ICD-10-CM

## 2014-08-03 DIAGNOSIS — Z13220 Encounter for screening for lipoid disorders: Secondary | ICD-10-CM | POA: Diagnosis not present

## 2014-08-03 DIAGNOSIS — D126 Benign neoplasm of colon, unspecified: Secondary | ICD-10-CM

## 2014-08-03 DIAGNOSIS — D509 Iron deficiency anemia, unspecified: Secondary | ICD-10-CM

## 2014-08-03 DIAGNOSIS — Z139 Encounter for screening, unspecified: Secondary | ICD-10-CM | POA: Diagnosis not present

## 2014-08-03 DIAGNOSIS — J209 Acute bronchitis, unspecified: Secondary | ICD-10-CM | POA: Diagnosis not present

## 2014-08-03 DIAGNOSIS — D649 Anemia, unspecified: Secondary | ICD-10-CM | POA: Diagnosis not present

## 2014-08-03 DIAGNOSIS — Z125 Encounter for screening for malignant neoplasm of prostate: Secondary | ICD-10-CM | POA: Diagnosis not present

## 2014-08-03 DIAGNOSIS — J0110 Acute frontal sinusitis, unspecified: Secondary | ICD-10-CM

## 2014-08-03 LAB — COMPLETE METABOLIC PANEL WITH GFR
ALT: 11 U/L (ref 0–53)
AST: 15 U/L (ref 0–37)
Albumin: 3.6 g/dL (ref 3.5–5.2)
Alkaline Phosphatase: 63 U/L (ref 39–117)
BILIRUBIN TOTAL: 0.7 mg/dL (ref 0.2–1.2)
BUN: 8 mg/dL (ref 6–23)
CHLORIDE: 105 meq/L (ref 96–112)
CO2: 27 meq/L (ref 19–32)
CREATININE: 1.18 mg/dL (ref 0.50–1.35)
Calcium: 8.7 mg/dL (ref 8.4–10.5)
GFR, EST AFRICAN AMERICAN: 74 mL/min
GFR, Est Non African American: 64 mL/min
Glucose, Bld: 99 mg/dL (ref 70–99)
Potassium: 3.8 mEq/L (ref 3.5–5.3)
Sodium: 143 mEq/L (ref 135–145)
TOTAL PROTEIN: 6.6 g/dL (ref 6.0–8.3)

## 2014-08-03 LAB — CBC WITH DIFFERENTIAL/PLATELET
BASOS ABS: 0.1 10*3/uL (ref 0.0–0.1)
BASOS PCT: 1 % (ref 0–1)
EOS ABS: 0.6 10*3/uL (ref 0.0–0.7)
Eosinophils Relative: 10 % — ABNORMAL HIGH (ref 0–5)
HCT: 35.8 % — ABNORMAL LOW (ref 39.0–52.0)
HEMOGLOBIN: 11.9 g/dL — AB (ref 13.0–17.0)
LYMPHS ABS: 1.6 10*3/uL (ref 0.7–4.0)
Lymphocytes Relative: 25 % (ref 12–46)
MCH: 32.3 pg (ref 26.0–34.0)
MCHC: 33.2 g/dL (ref 30.0–36.0)
MCV: 97.3 fL (ref 78.0–100.0)
MONO ABS: 0.4 10*3/uL (ref 0.1–1.0)
MPV: 10.4 fL (ref 8.6–12.4)
Monocytes Relative: 6 % (ref 3–12)
NEUTROS PCT: 58 % (ref 43–77)
Neutro Abs: 3.6 10*3/uL (ref 1.7–7.7)
PLATELETS: 177 10*3/uL (ref 150–400)
RBC: 3.68 MIL/uL — ABNORMAL LOW (ref 4.22–5.81)
RDW: 13.7 % (ref 11.5–15.5)
WBC: 6.2 10*3/uL (ref 4.0–10.5)

## 2014-08-03 LAB — FERRITIN: Ferritin: 133 ng/mL (ref 22–322)

## 2014-08-03 LAB — LIPID PANEL
CHOL/HDL RATIO: 3.1 ratio
CHOLESTEROL: 120 mg/dL (ref 0–200)
HDL: 39 mg/dL — ABNORMAL LOW (ref >39)
LDL CALC: 63 mg/dL (ref 0–99)
Triglycerides: 88 mg/dL (ref <150)
VLDL: 18 mg/dL (ref 0–40)

## 2014-08-03 LAB — TSH: TSH: 2.689 u[IU]/mL (ref 0.350–4.500)

## 2014-08-03 LAB — IRON: Iron: 102 ug/dL (ref 42–165)

## 2014-08-03 MED ORDER — AMLODIPINE BESYLATE 2.5 MG PO TABS: 2.5000 mg | ORAL_TABLET | Freq: Every day | ORAL | Status: DC

## 2014-08-03 MED ORDER — PENICILLIN V POTASSIUM 500 MG PO TABS: 500.0000 mg | ORAL_TABLET | Freq: Three times a day (TID) | ORAL | Status: DC

## 2014-08-03 MED ORDER — BENZONATATE 100 MG PO CAPS: 100.0000 mg | ORAL_CAPSULE | Freq: Three times a day (TID) | ORAL | Status: DC

## 2014-08-03 NOTE — Assessment & Plan Note (Signed)
Uncontrolled , add amlodipine DASH diet and commitment to daily physical activity for a minimum of 30 minutes discussed and encouraged, as a part of hypertension management. The importance of attaining a healthy weight is also discussed.  

## 2014-08-03 NOTE — Assessment & Plan Note (Signed)
Penicillin prescribed, and saline nasal flushes encouraged

## 2014-08-03 NOTE — Progress Notes (Signed)
   Subjective:    Patient ID: Marc Ward, male    DOB: Dec 01, 1950, 64 y.o.   MRN: [id number]  HPI 3 week h/o worsening head and chest congestion, associated with fever and chills intermittently. Nasal drainage has thickened , and is yellowish green, and at times bloody. Sputum is thick and yellow at times Denies bilateral ear pressure, denies hearing loss and sore throat. Increasing fatigue , poor appetitie and sleep disturbed by cough. No improvement with OTC medication. Needs gI follow up O Otherwise has been well    Review of Systems    See HPI  Denies chest pains, palpitations and leg swelling Denies abdominal pain, nausea, vomiting,diarrhea or constipation.   Denies dysuria, frequency, hesitancy or incontinence. Denies joint pain, swelling and limitation in mobility. Denies headaches, seizures, numbness, or tingling. Denies depression, anxiety or insomnia. Denies skin break down or rash.     Objective:   Physical Exam BP 150/100 mmHg  Pulse 73  Resp 16  Ht 6' (1.829 m)  Wt 179 lb 1.9 oz (81.248 kg)  BMI 24.29 kg/m2  SpO2 96% Patient alert and oriented and in no cardiopulmonary distress.  HEENT: No facial asymmetry, EOMI,   oropharynx pink and moist.  Neck supple no JVD, no mass. Frontal  sinus tenderness, TM clear  Chest: decreased though adequate air entry, left basilar crackles, no wheezes  CVS: S1, S2 no murmurs, no S3.Regular rate.  ABD: Soft non tender.   Ext: No edema  MS: Adequate ROM spine, shoulders, hips and knees.  Skin: Intact, no ulcerations or rash noted.  Psych: Good eye contact, normal affect. Memory intact not anxious or depressed appearing.  CNS: CN 2-12 intact, power,  normal throughout.no focal deficits noted.        Assessment & Plan:  Acute frontal sinusitis Penicillin prescribed, and saline nasal flushes encouraged   Acute bronchitis Antibiotic and decongestant prescribed.     Essential hypertension Uncontrolled,  add amlodipine DASH diet and commitment to daily physical activity for a minimum of 30 minutes discussed and encouraged, as a part of hypertension management. The importance of attaining a healthy weight is also discussed.    TUBULOVILLOUS ADENOMA, COLON Asymptomatic, but GI re eval is due , referral entered

## 2014-08-03 NOTE — Assessment & Plan Note (Signed)
Asymptomatic, but GI re eval is due , referral entered

## 2014-08-03 NOTE — Patient Instructions (Addendum)
Annual wellness in 6 weeks, call if you need me before  You are treated for sinusitis and bronchitis, 2 medications, penicillin and tessalon perles are prescribed  Blood pressure is high, continue the current medication, New additional medicine is amlodipine 2.5mg  one every evening at 9pm  Fasting labs today  I have referred you for follow up with Dr Oneida Alar which is overdue

## 2014-08-03 NOTE — Assessment & Plan Note (Signed)
Antibiotic and decongestant prescribed 

## 2014-08-04 LAB — PSA, MEDICARE: PSA: 0.77 ng/mL (ref <=4.00)

## 2014-08-05 NOTE — Progress Notes (Signed)
Quick Note:  noted No med change labs are excellent overall, not as anemic and Only needs to commit to more regular exercise to improve good cholesterol ______

## 2014-08-06 ENCOUNTER — Encounter: Payer: Self-pay | Admitting: Gastroenterology

## 2014-08-17 ENCOUNTER — Other Ambulatory Visit: Payer: Self-pay | Admitting: Family Medicine

## 2014-09-05 ENCOUNTER — Ambulatory Visit: Payer: Medicare Other | Admitting: Gastroenterology

## 2014-09-06 ENCOUNTER — Ambulatory Visit (INDEPENDENT_AMBULATORY_CARE_PROVIDER_SITE_OTHER): Payer: Medicare Other | Admitting: Gastroenterology

## 2014-09-06 DIAGNOSIS — D649 Anemia, unspecified: Secondary | ICD-10-CM

## 2014-09-06 DIAGNOSIS — K2970 Gastritis, unspecified, without bleeding: Secondary | ICD-10-CM

## 2014-09-06 DIAGNOSIS — B9681 Helicobacter pylori [H. pylori] as the cause of diseases classified elsewhere: Secondary | ICD-10-CM | POA: Diagnosis not present

## 2014-09-06 DIAGNOSIS — D126 Benign neoplasm of colon, unspecified: Secondary | ICD-10-CM

## 2014-09-06 NOTE — Progress Notes (Signed)
   Subjective:    Patient ID: Marc Ward, male    DOB: [birth year], 64 y.o.   MRN: [id number]  Tula Nakayama, MD  HPI  Feels good. Wants to know when his next TCS will be. BMs: DAILY.   PT DENIES FEVER, CHILLS, HEMATOCHEZIA, HEMATEMESIS, nausea, vomiting, melena, diarrhea, CHEST PAIN, SHORTNESS OF BREATH,  CHANGE IN BOWEL IN HABITS, constipation, abdominal pain, problems swallowing, OR heartburn or indigestion.  Past Medical History  Diagnosis Date  . Sickle cell trait   . Hypertension 2009  . H. pylori infection FEB 2011    ABO BID x1O DAYS  . IDA (iron deficiency anemia)    Past Surgical History  Procedure Laterality Date  . Tonsillectomy    . Adenoidectomy    . Circumcision    . Colonoscopy  AUG 2010    ADVANCED [place] ADENOMA, TICS,   . Upper gastrointestinal endoscopy  FEB 2011    H. PYLORI  . Laparotomy  11/08/2011    Procedure: EXPLORATORY LAPAROTOMY;  Surgeon: Jamesetta So, MD;  Location: [facility];  Service: General;  Laterality: N/A;  . Bowel resection  11/08/2011    Procedure: SMALL BOWEL RESECTION;  Surgeon: Jamesetta So, MD;  Location: [facility];  Service: General;  Laterality: N/A;    No Known Allergies  Current Outpatient Prescriptions  Medication Sig Dispense Refill  . amLODipine (NORVASC) 2.5 MG tablet Take 1 tablet (2.5 mg total) by mouth daily.    Marland Kitchen aspirin 81 MG tablet Take 81 mg by mouth daily.    .      . Ferrous Sulfate Dried (FEOSOL) 200 (65 FE) MG TABS Take 1 tablet by mouth daily.     Marland Kitchen lisinopril (PRINIVIL,ZESTRIL) 20 MG tablet TAKE 1 TABLET EVERY DAY    . omeprazole (PRILOSEC) 20 MG capsule TAKE ONE CAPSULE BY MOUTH EVERY MORNING    .        Review of Systems JAN 2016 HB 11.9    Objective:   Physical Exam  Constitutional: He is oriented to person, place, and time. He appears well-developed and well-nourished. No distress.  HENT:  Head: Normocephalic and atraumatic.  Mouth/Throat: Oropharynx is clear and moist. No oropharyngeal exudate.    Eyes: Pupils are equal, round, and reactive to light. No scleral icterus.  Neck: Normal range of motion. Neck supple.  Cardiovascular: Normal rate, regular rhythm and normal heart sounds.   Pulmonary/Chest: Effort normal and breath sounds normal. No respiratory distress.  Abdominal: Soft. Bowel sounds are normal. He exhibits no distension. There is no tenderness.  Musculoskeletal: He exhibits no edema.  Lymphadenopathy:    He has no cervical adenopathy.  Neurological: He is alert and oriented to person, place, and time.  NO FOCAL DEFICITS   Psychiatric: He has a normal mood and affect.  Vitals reviewed.         Assessment & Plan:

## 2014-09-06 NOTE — Assessment & Plan Note (Signed)
SYMPTOMS CONTROLLED.  CONTINUE TO MONITOR SYMPTOMS.

## 2014-09-06 NOTE — Assessment & Plan Note (Signed)
NO BRBPR OR MELENA.  CONTINUE TO MONITOR SYMPTOMS. FOLLOW UP IN 6 MOS TO 1 YR.

## 2014-09-06 NOTE — Progress Notes (Signed)
cc'ed to pcp °

## 2014-09-06 NOTE — Patient Instructions (Signed)
YOUR BLOOD COUNT IS BETTER THAN IT HAS BEEN SINCE 2014.  NEXT COLONOSCOPY IN DEC 2017.  PLEASE CALL IF YOU SEE BLACK TARRY STOOLS OR HAVE RECTAL BLEEDING.  FOLLOW UP IN 6 MOS TO 1 YEAR.

## 2014-09-06 NOTE — Progress Notes (Signed)
ON RECALL LIST  °

## 2014-09-06 NOTE — Assessment & Plan Note (Signed)
NO WARNING SIGNS/SYMPTOMS  NEXT TCS DEC 2017

## 2014-10-11 ENCOUNTER — Ambulatory Visit (INDEPENDENT_AMBULATORY_CARE_PROVIDER_SITE_OTHER): Payer: Medicare Other | Admitting: Family Medicine

## 2014-10-11 VITALS — BP 142/94 | HR 84 | Resp 14 | Ht 72.0 in | Wt 180.0 lb

## 2014-10-11 DIAGNOSIS — R9431 Abnormal electrocardiogram [ECG] [EKG]: Secondary | ICD-10-CM

## 2014-10-11 DIAGNOSIS — Z0000 Encounter for general adult medical examination without abnormal findings: Secondary | ICD-10-CM | POA: Diagnosis not present

## 2014-10-11 DIAGNOSIS — Z1159 Encounter for screening for other viral diseases: Secondary | ICD-10-CM

## 2014-10-11 DIAGNOSIS — Z23 Encounter for immunization: Secondary | ICD-10-CM | POA: Diagnosis not present

## 2014-10-11 DIAGNOSIS — F1490 Cocaine use, unspecified, uncomplicated: Secondary | ICD-10-CM

## 2014-10-11 DIAGNOSIS — F1410 Cocaine abuse, uncomplicated: Secondary | ICD-10-CM

## 2014-10-11 DIAGNOSIS — I10 Essential (primary) hypertension: Secondary | ICD-10-CM

## 2014-10-11 MED ORDER — AMLODIPINE BESYLATE 5 MG PO TABS
5.0000 mg | ORAL_TABLET | Freq: Every day | ORAL | Status: DC
Start: 2014-10-11 — End: 2015-04-15

## 2014-10-11 NOTE — Patient Instructions (Addendum)
F/u in 4 month, call if you need me before  Nurse bP check in 3 weeks  Change from amlodipine 2.5 mg daily to 5 mg dily, your blood pressure is too high  Prevnar today    Start daily exercise for 30 mins and reduce sweet intake please  Right ear flush today  Work on use of substances that harm you, need to quit  EKG is abnormal, suggests you may have had heart damage so I am asking  a cartdiologist to furhter evaluate you  Fasting chem 7 and HIV in 4 month

## 2014-10-11 NOTE — Progress Notes (Signed)
Subjective:    Patient ID: Marc Ward, male    DOB: [birth year], 64 y.o.   MRN: [id number]  HPI Preventive Screening-Counseling & Management   Patient present here today for a Medicare annual wellness visit.   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 No  Over the past two weeks, have you felt little interest or pleasure in doing things? No  Have you lost interest or pleasure in daily life? No  Do you often feel hopeless? No  Do you cry easily over simple problems? No   Activities of Daily Living  In your present state of health, do you have any difficulty performing the following activities?  Driving?: No Managing money?: No Feeding yourself?:No Getting from bed to chair?:No Climbing a flight of stairs?:No Preparing food and eating?:No Bathing or showering?:No Getting dressed?:No Getting to the toilet?:No Using the toilet?:No Moving around from place to place?: No  Fall Risk Assessment In the past year have you fallen or had a near fall?:No Are you currently taking any medications that make you dizzy?:No   Hearing Difficulties: No Do you often ask people to speak up or repeat themselves?:No Do you experience ringing or noises in your ears?:No Do you have difficulty understanding soft or whispered voices?:No  Cognitive Testing  Alert? Yes Normal Appearance?Yes  Oriented to person? Yes Place? Yes  Time? Yes  Displays  appropriate judgment?Yes  Can read the correct time from a watch face? yes Are you having problems remembering things?No  Advanced Directives have been discussed with the patient?Yes , full code   List the Names of Other Physician/Practitioners you currently use: Dr Oneida Alar and Dr Army Fossa any recent Medical Services you may have received from other than [facility] providers in the past year (date may be approximate).   Assessment:    Annual Wellness Exam   Plan:    .  Medicare Attestation  I have personally reviewed:  The patient's medical and social history  Their use of alcohol, tobacco or illicit drugs  Their current medications and supplements  The patient's functional ability including ADLs,fall risks, home safety risks, cognitive, and hearing and visual impairment  Diet and physical activities  Evidence for depression or mood disorders  The patient's weight, height, BMI, and visual acuity have been recorded in the chart. I have made referrals, counseling, and provided education to the patient based on review of the above and I have provided the patient with a written personalized care plan for preventive services.      Review of Systems     Objective:   Physical Exam BP 142/94 mmHg  Pulse 84  Resp 14  Ht 6' (1.829 m)  Wt 180 lb (81.647 kg)  BMI 24.41 kg/m2  SpO2 100%       Assessment & Plan:  Welcome to Medicare preventive visit Annual exam as documented. Counseling done  re healthy lifestyle involving commitment to 150 minutes  exercise per week, heart healthy diet, and attaining healthy weight.The importance of adequate sleep also discussed. Regular seat belt use and home safety, is also discussed. Changes in health habits are decided on by the patient with goals and time frames  set for achieving them. Immunization and cancer screening needs are specifically addressed at this visit.    Abnormal finding on EKG EKG done at welcome to medicare  visit suggestive of lateral ischemia with inverted t waves CV risk factors are hypertension, lack of physical activity, and cocaine use. Counseled re need to address these issues He is also referred to cardiology for further evalution   Essential hypertension Uncontrolled, increase amlodipine dose to 5 mg daily with recheck by nurse in 3 weeks DASH diet and commitment to daily physical activity for a minimum of 30 minutes discussed and encouraged, as a part of hypertension management. The importance of attaining a healthy weight is also discussed.  BP/Weight 10/11/2014 08/03/2014 09/11/2013 07/27/2013 06/23/2013 03/30/2013 07/30/7354  Systolic BP 701 410 301 314 388 875 797  Diastolic BP 94 282 92 80 060 78 75  Wt. (Lbs) 180 179.12 183 184.04 180.08 179 179.8  BMI 24.41 24.29 24.81 24.95 24.42 24.27 24.38    CMP Latest Ref Rng 08/03/2014 06/23/2013 03/10/2013  Glucose 70 - 99 mg/dL 99 87 86  BUN 6 - 23 mg/dL 8 14 14   Creatinine 0.50 - 1.35 mg/dL 1.18 1.29 1.42(H)  Sodium 135 - 145 mEq/L 143 147(H) 142  Potassium 3.5 - 5.3 mEq/L 3.8 4.0 3.6  Chloride 96 - 112 mEq/L 105 109 106  CO2 19 - 32 mEq/L 27 30 27   Calcium 8.4 - 10.5 mg/dL 8.7 8.8 8.7  Total Protein 6.0 - 8.3 g/dL 6.6 - -  Total Bilirubin 0.2 - 1.2 mg/dL 0.7 - -  Alkaline Phos 39 - 117 U/L 63 - -  AST 0 - 37 U/L 15 - -  ALT 0 - 53 U/L 11 - -        Cocaine use Ongoing drug use, counseled re need to quit and recommended attendance at [employer], not highly motivated to go this route currently, but does realize the need to quit.   Need for vaccination with 13-polyvalent pneumococcal conjugate vaccine After obtaining informed consent, the vaccine is  administered by LPN.

## 2014-10-11 NOTE — Assessment & Plan Note (Signed)

## 2014-10-13 ENCOUNTER — Encounter: Payer: Self-pay | Admitting: Family Medicine

## 2014-10-13 DIAGNOSIS — R9431 Abnormal electrocardiogram [ECG] [EKG]: Secondary | ICD-10-CM | POA: Insufficient documentation

## 2014-10-13 DIAGNOSIS — Z23 Encounter for immunization: Secondary | ICD-10-CM | POA: Insufficient documentation

## 2014-10-13 NOTE — Assessment & Plan Note (Signed)
After obtaining informed consent, the vaccine is  administered by LPN.  

## 2014-10-13 NOTE — Assessment & Plan Note (Signed)
EKG done at welcome to medicare visit suggestive of lateral ischemia with inverted t waves CV risk factors are hypertension, lack of physical activity, and cocaine use. Counseled re need to address these issues He is also referred to cardiology for further evalution

## 2014-10-13 NOTE — Assessment & Plan Note (Signed)
Ongoing drug use, counseled re need to quit and recommended attendance at [place] meetings, not highly motivated to go this route currently, but does realize the need to quit.

## 2014-10-13 NOTE — Assessment & Plan Note (Signed)
Uncontrolled, increase amlodipine dose to 5 mg daily with recheck by nurse in 3 weeks DASH diet and commitment to daily physical activity for a minimum of 30 minutes discussed and encouraged, as a part of hypertension management. The importance of attaining a healthy weight is also discussed.  BP/Weight 10/11/2014 08/03/2014 09/11/2013 07/27/2013 06/23/2013 03/30/2013 0/30/0923  Systolic BP 300 762 263 335 456 256 389  Diastolic BP 94 373 92 80 428 78 75  Wt. (Lbs) 180 179.12 183 184.04 180.08 179 179.8  BMI 24.41 24.29 24.81 24.95 24.42 24.27 24.38    CMP Latest Ref Rng 08/03/2014 06/23/2013 03/10/2013  Glucose 70 - 99 mg/dL 99 87 86  BUN 6 - 23 mg/dL 8 14 14   Creatinine 0.50 - 1.35 mg/dL 1.18 1.29 1.42(H)  Sodium 135 - 145 mEq/L 143 147(H) 142  Potassium 3.5 - 5.3 mEq/L 3.8 4.0 3.6  Chloride 96 - 112 mEq/L 105 109 106  CO2 19 - 32 mEq/L 27 30 27   Calcium 8.4 - 10.5 mg/dL 8.7 8.8 8.7  Total Protein 6.0 - 8.3 g/dL 6.6 - -  Total Bilirubin 0.2 - 1.2 mg/dL 0.7 - -  Alkaline Phos 39 - 117 U/L 63 - -  AST 0 - 37 U/L 15 - -  ALT 0 - 53 U/L 11 - -

## 2014-10-15 ENCOUNTER — Other Ambulatory Visit: Payer: Self-pay | Admitting: Family Medicine

## 2014-10-25 DIAGNOSIS — R809 Proteinuria, unspecified: Secondary | ICD-10-CM | POA: Diagnosis not present

## 2014-10-25 DIAGNOSIS — E559 Vitamin D deficiency, unspecified: Secondary | ICD-10-CM | POA: Diagnosis not present

## 2014-10-25 DIAGNOSIS — Z79899 Other long term (current) drug therapy: Secondary | ICD-10-CM | POA: Diagnosis not present

## 2014-10-25 DIAGNOSIS — N183 Chronic kidney disease, stage 3 (moderate): Secondary | ICD-10-CM | POA: Diagnosis not present

## 2014-10-25 DIAGNOSIS — I10 Essential (primary) hypertension: Secondary | ICD-10-CM | POA: Diagnosis not present

## 2014-10-29 ENCOUNTER — Encounter: Payer: Self-pay | Admitting: Family Medicine

## 2014-10-30 DIAGNOSIS — D571 Sickle-cell disease without crisis: Secondary | ICD-10-CM | POA: Diagnosis not present

## 2014-10-30 DIAGNOSIS — N182 Chronic kidney disease, stage 2 (mild): Secondary | ICD-10-CM | POA: Diagnosis not present

## 2014-10-30 DIAGNOSIS — E870 Hyperosmolality and hypernatremia: Secondary | ICD-10-CM | POA: Diagnosis not present

## 2014-10-30 DIAGNOSIS — R809 Proteinuria, unspecified: Secondary | ICD-10-CM | POA: Diagnosis not present

## 2014-11-01 ENCOUNTER — Ambulatory Visit: Payer: Medicare Other

## 2014-11-01 VITALS — BP 138/82

## 2014-11-01 DIAGNOSIS — I10 Essential (primary) hypertension: Secondary | ICD-10-CM

## 2014-11-02 ENCOUNTER — Encounter: Payer: Self-pay | Admitting: Cardiovascular Disease

## 2014-11-02 ENCOUNTER — Encounter: Payer: Self-pay | Admitting: *Deleted

## 2014-11-02 ENCOUNTER — Ambulatory Visit (INDEPENDENT_AMBULATORY_CARE_PROVIDER_SITE_OTHER): Payer: Medicare Other | Admitting: Cardiovascular Disease

## 2014-11-02 VITALS — BP 138/100 | HR 64 | Ht 72.0 in | Wt 179.2 lb

## 2014-11-02 DIAGNOSIS — I10 Essential (primary) hypertension: Secondary | ICD-10-CM | POA: Diagnosis not present

## 2014-11-02 DIAGNOSIS — R9431 Abnormal electrocardiogram [ECG] [EKG]: Secondary | ICD-10-CM | POA: Diagnosis not present

## 2014-11-02 DIAGNOSIS — F1410 Cocaine abuse, uncomplicated: Secondary | ICD-10-CM

## 2014-11-02 NOTE — Patient Instructions (Signed)
Your physician recommends that you schedule a follow-up appointment in: After your stress test.  Your physician has requested that you have an exercise tolerance test. For further information please visit [email or web address]. Please also follow instruction sheet, as given.  Your physician recommends that you continue on your current medications as directed. Please refer to the Current Medication list given to you today.  Thank you for choosing [facility]!

## 2014-11-02 NOTE — Progress Notes (Signed)
Patient ID: Marc Ward, male   DOB: [birth year], 64 y.o.   MRN: [id number]       CARDIOLOGY CONSULT NOTE  Patient ID: Marc Ward MRN: [id number] DOB/AGE: 10-19-1950 64 y.o.  Admit date: (Not on file) Primary Physician Tula Nakayama, MD  Reason for Consultation: abnormal ECG  HPI: The patient is a 64 year old male with a history of cocaine use and hypertension who is referred for an abnormal ECG by Dr. Moshe Cipro, his primary care provider. ECG performed in March demonstrated sinus bradycardia, heart rate 47 bpm, with a nonspecific T-wave abnormality in lead V4 and T-wave inversions in leads V5 and V6. He denies exertional chest pain, palpitations, and shortness of breath. He denies cigarette use but uses cocaine approximately once a week and occasionally smokes marijuana. He had a syncopal episode approximately 4 years ago denies any recent episodes of syncope or lightheadedness or dizziness. He is also denies orthopnea and leg swelling.  ECG performed in the office today demonstrates sinus rhythm with a nonspecific ST segment and T-wave abnormality and an isolated PVC.  Soc: He denies cigarette use but uses cocaine approximately once a week and occasionally smokes marijuana.  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   No Known Allergies  Current Outpatient Prescriptions  Medication Sig Dispense Refill  . amLODipine (NORVASC) 5 MG tablet Take 1 tablet (5 mg total) by mouth daily. 30 tablet 5  . aspirin 81 MG tablet Take 81 mg by mouth daily.    . Ferrous Sulfate Dried (FEOSOL) 200 (65 FE) MG TABS Take 1 tablet by mouth daily.     Marland Kitchen lisinopril (PRINIVIL,ZESTRIL) 20 MG tablet TAKE 1 TABLET BY MOUTH EVERY DAY 90 tablet 1  . omeprazole (PRILOSEC) 20 MG capsule TAKE ONE CAPSULE BY MOUTH EVERY MORNING 30 capsule 5   No current facility-administered medications for this visit.    Past Medical History  Diagnosis Date  . Sickle cell trait   . Hypertension  2009  . H. pylori infection FEB 2011    ABO BID x1O DAYS  . IDA (iron deficiency anemia)   . Tubulovillous adenoma of colon 2013    currently under close surveillance by GI  . Substance use disorder     cocaine and marijuana    Past Surgical History  Procedure Laterality Date  . Tonsillectomy    . Adenoidectomy    . Circumcision    . Colonoscopy  AUG 2010    ADVANCED Addy ADENOMA, TICS,   . Upper gastrointestinal endoscopy  FEB 2011    H. PYLORI  . Laparotomy  11/08/2011    Procedure: EXPLORATORY LAPAROTOMY;  Surgeon: Jamesetta So, MD;  Location: [facility];  Service: General;  Laterality: N/A;  . Bowel resection  11/08/2011    Procedure: SMALL BOWEL RESECTION;  Surgeon: Jamesetta So, MD;  Location: [facility];  Service: General;  Laterality: N/A;    History   Social History  . Marital Status: Divorced    Spouse Name: N/A  . Number of Children: 3  . Years of Education: N/A   Occupational History  . Unemployed    .     Social History Main Topics  . Smoking status: Never Smoker   . Smokeless tobacco: Never Used  . Alcohol Use: Yes     Comment: occasional drinker-2 beers/wk  . Drug Use: 1.00 per week    Special: Marijuana, Cocaine     Comment: occassional usage; couple times per  week  . Sexual Activity:    Partners: Female    Museum/gallery curator: None     Comment: girlfriend   Other Topics Concern  . Not on file   Social History Narrative     No family history of premature CAD in 1st degree relatives.  Prior to Admission medications   Medication Sig Start Date End Date Taking? Authorizing Provider  amLODipine (NORVASC) 5 MG tablet Take 1 tablet (5 mg total) by mouth daily. 10/11/14  Yes Fayrene Helper, MD  aspirin 81 MG tablet Take 81 mg by mouth daily.   Yes Historical Provider, MD  Ferrous Sulfate Dried (FEOSOL) 200 (65 FE) MG TABS Take 1 tablet by mouth daily.    Yes Historical Provider, MD  lisinopril (PRINIVIL,ZESTRIL) 20 MG tablet TAKE 1 TABLET BY  MOUTH EVERY DAY 10/16/14  Yes Fayrene Helper, MD  omeprazole (PRILOSEC) 20 MG capsule TAKE ONE CAPSULE BY MOUTH EVERY MORNING 08/17/14  Yes Fayrene Helper, MD     Review of systems complete and found to be negative unless listed above in HPI     Physical exam Blood pressure 138/100, pulse 64, height 6' (1.829 m), weight 179 lb 3.2 oz (81.285 kg). General: NAD Neck: No JVD, no thyromegaly or thyroid nodule.  Lungs: Clear to auscultation bilaterally with normal respiratory effort. CV: Nondisplaced PMI. Regular rate and rhythm, normal S1/S2, no S3/S4, no murmur.  No peripheral edema.  No carotid bruit.  Normal pedal pulses.  Abdomen: Soft, nontender, no hepatosplenomegaly, no distention.  Skin: Intact without lesions or rashes.  Neurologic: Alert and oriented x 3.  Psych: Normal affect. Extremities: No clubbing or cyanosis.  HEENT: Normal.   ECG: Most recent ECG reviewed.  Labs:   Lab Results  Component Value Date   WBC 6.2 08/03/2014   HGB 11.9* 08/03/2014   HCT 35.8* 08/03/2014   MCV 97.3 08/03/2014   PLT 177 08/03/2014   No results for input(s): NA, K, CL, CO2, BUN, CREATININE, CALCIUM, PROT, BILITOT, ALKPHOS, ALT, AST, GLUCOSE in the last 168 hours.  Invalid input(s): LABALBU Lab Results  Component Value Date   CKTOTAL 169 11/08/2011   CKMB 2.7 11/08/2011   TROPONINI <0.30 11/08/2011    Lab Results  Component Value Date   CHOL 120 08/03/2014   CHOL 111 06/23/2013   CHOL 117 06/20/2012   Lab Results  Component Value Date   HDL 39* 08/03/2014   HDL 40 06/23/2013   HDL 42 06/20/2012   Lab Results  Component Value Date   LDLCALC 63 08/03/2014   LDLCALC 56 06/23/2013   LDLCALC 62 06/20/2012   Lab Results  Component Value Date   TRIG 88 08/03/2014   TRIG 74 06/23/2013   TRIG 66 06/20/2012   Lab Results  Component Value Date   CHOLHDL 3.1 08/03/2014   CHOLHDL 2.8 06/23/2013   CHOLHDL 2.8 06/20/2012   No results found for: LDLDIRECT        Studies: No results found.  ASSESSMENT AND PLAN:  1. Abnormal ECG: No exertional symptoms but cardiovascular risk factors include cocaine use and HTN. Will obtain a GXT for further clarification.  2. Essential HTN: Says normally controlled. Elevated DBP today. Will monitor.  Dispo: f/u to be determined based on GXT results.  Signed: Kate Sable, M.D., F.A.C.C.  11/02/2014, 3:15 PM

## 2014-11-02 NOTE — Addendum Note (Signed)
Addended by: Levonne Hubert on: 11/02/2014 03:41 PM   Modules accepted: Orders, Level of Service

## 2015-01-23 ENCOUNTER — Encounter: Payer: Self-pay | Admitting: Gastroenterology

## 2015-01-31 ENCOUNTER — Ambulatory Visit (INDEPENDENT_AMBULATORY_CARE_PROVIDER_SITE_OTHER): Payer: Medicare Other | Admitting: Family Medicine

## 2015-01-31 ENCOUNTER — Encounter: Payer: Self-pay | Admitting: Family Medicine

## 2015-01-31 VITALS — BP 120/90 | HR 50 | Resp 16 | Ht 72.0 in | Wt 180.1 lb

## 2015-01-31 DIAGNOSIS — B9681 Helicobacter pylori [H. pylori] as the cause of diseases classified elsewhere: Secondary | ICD-10-CM

## 2015-01-31 DIAGNOSIS — Z1159 Encounter for screening for other viral diseases: Secondary | ICD-10-CM | POA: Diagnosis not present

## 2015-01-31 DIAGNOSIS — F1410 Cocaine abuse, uncomplicated: Secondary | ICD-10-CM

## 2015-01-31 DIAGNOSIS — Z114 Encounter for screening for human immunodeficiency virus [HIV]: Secondary | ICD-10-CM

## 2015-01-31 DIAGNOSIS — D126 Benign neoplasm of colon, unspecified: Secondary | ICD-10-CM

## 2015-01-31 DIAGNOSIS — I10 Essential (primary) hypertension: Secondary | ICD-10-CM | POA: Diagnosis not present

## 2015-01-31 DIAGNOSIS — F1490 Cocaine use, unspecified, uncomplicated: Secondary | ICD-10-CM

## 2015-01-31 DIAGNOSIS — Z723 Lack of physical exercise: Secondary | ICD-10-CM

## 2015-01-31 DIAGNOSIS — K2970 Gastritis, unspecified, without bleeding: Secondary | ICD-10-CM

## 2015-01-31 LAB — CBC
HCT: 35.3 % — ABNORMAL LOW (ref 39.0–52.0)
Hemoglobin: 11.6 g/dL — ABNORMAL LOW (ref 13.0–17.0)
MCH: 31.9 pg (ref 26.0–34.0)
MCHC: 32.9 g/dL (ref 30.0–36.0)
MCV: 97 fL (ref 78.0–100.0)
MPV: 10.3 fL (ref 8.6–12.4)
Platelets: 158 10*3/uL (ref 150–400)
RBC: 3.64 MIL/uL — ABNORMAL LOW (ref 4.22–5.81)
RDW: 13.8 % (ref 11.5–15.5)
WBC: 5.4 10*3/uL (ref 4.0–10.5)

## 2015-01-31 LAB — BASIC METABOLIC PANEL
BUN: 14 mg/dL (ref 6–23)
CO2: 28 mEq/L (ref 19–32)
Calcium: 8.7 mg/dL (ref 8.4–10.5)
Chloride: 108 mEq/L (ref 96–112)
Creat: 1.32 mg/dL (ref 0.50–1.35)
Glucose, Bld: 94 mg/dL (ref 70–99)
Potassium: 4.1 mEq/L (ref 3.5–5.3)
SODIUM: 146 meq/L — AB (ref 135–145)

## 2015-01-31 NOTE — Patient Instructions (Addendum)
F/u in early December, call if you need me before  Labs today CBC, chem 7, HIV, and vit D BP is high, , need to reduce salt and commit to walking every pm, for 30 mins It is important that you exercise regularly at least 30 minutes 7 times a week. If you develop chest pain, have severe difficulty breathing, or feel very tired, stop exercising immediately and seek medical attention    Please work on good  health habits so that your health will improve. 1. Commitment to daily physical activity for 30 to 60  minutes, if you are able to do this.  2. Commitment to wise food choices. Aim for half of your  food intake to be vegetable and fruit, one quarter starchy foods, and one quarter protein. Try to eat on a regular schedule  3 meals per day, snacking between meals should be limited to vegetables or fruits or small portions of nuts. 64 ounces of water per day is generally recommended, unless you have specific health conditions, like heart failure or kidney failure where you will need to limit fluid intake.  3. Commitment to sufficient and a  good quality of physical and mental rest daily, generally between 6 to 8 hours per day.  WITH PERSISTANCE AND PERSEVERANCE, THE IMPOSSIBLE , BECOMES THE NORM!

## 2015-02-01 LAB — HIV ANTIBODY (ROUTINE TESTING W REFLEX): HIV 1&2 Ab, 4th Generation: NONREACTIVE

## 2015-02-03 DIAGNOSIS — Z723 Lack of physical exercise: Secondary | ICD-10-CM | POA: Insufficient documentation

## 2015-02-03 NOTE — Assessment & Plan Note (Signed)
Still has not started exercise, states he is "not motivated', has the complication of uncontrolled hypertension again encouraged to change and adopt healthy lifestyle. He is working on this

## 2015-02-03 NOTE — Assessment & Plan Note (Signed)
Diastolic pressure elevated. No med change. Commitment to daily exercise, weight loss and reduced salt intake DASH diet and commitment to daily physical activity for a minimum of 30 minutes discussed and encouraged, as a part of hypertension management. The importance of attaining a healthy weight is also discussed.  BP/Weight 01/31/2015 11/02/2014 11/01/2014 10/11/2014 08/03/2014 3/47/5830 01/20/6001  Systolic BP 984 730 856 943 700 525 910  Diastolic BP 90 289 82 94 022 92 80  Wt. (Lbs) 180.12 179.2 - 180 179.12 183 184.04  BMI 24.42 24.3 - 24.41 24.29 24.81 24.95

## 2015-02-03 NOTE — Progress Notes (Signed)
   Marc Ward     MRN: [id number]      DOB: 1950/12/06   HPI Marc Ward is here for follow up and re-evaluation of chronic medical conditions, medication management and review of any available recent lab and radiology data.  Preventive health is updated, specifically  Cancer screening and Immunization.   Questions or concerns regarding consultations or procedures which the PT has had in the interim are  addressed. The PT denies any adverse reactions to current medications since the last visit.  There are no new concerns.  There are no specific complaints   ROS Denies recent fever or chills. Denies sinus pressure, nasal congestion, ear pain or sore throat. Denies chest congestion, productive cough or wheezing. Denies chest pains, palpitations and leg swelling Denies abdominal pain, nausea, vomiting,diarrhea or constipation.   Denies dysuria, frequency, hesitancy or incontinence. Denies joint pain, swelling and limitation in mobility. Denies headaches, seizures, numbness, or tingling. Denies depression, anxiety or insomnia. Denies skin break down or rash.   PE  BP 120/90 mmHg  Pulse 50  Resp 16  Ht 6' (1.829 m)  Wt 180 lb 1.9 oz (81.702 kg)  BMI 24.42 kg/m2  SpO2 96%  Patient alert and oriented and in no cardiopulmonary distress.  HEENT: No facial asymmetry, EOMI,   oropharynx pink and moist.  Neck supple no JVD, no mass.  Chest: Clear to auscultation bilaterally.  CVS: S1, S2 no murmurs, no S3.Regular rate.  ABD: Soft non tender.   Ext: No edema  MS: Adequate ROM spine, shoulders, hips and knees.  Skin: Intact, no ulcerations or rash noted.  Psych: Good eye contact, normal affect. Memory intact not anxious or depressed appearing.  CNS: CN 2-12 intact, power,  normal throughout.no focal deficits noted.   Assessment & Plan   Essential hypertension Diastolic pressure elevated. No med change. Commitment to daily exercise, weight loss and reduced salt intake DASH  diet and commitment to daily physical activity for a minimum of 30 minutes discussed and encouraged, as a part of hypertension management. The importance of attaining a healthy weight is also discussed.  BP/Weight 01/31/2015 11/02/2014 11/01/2014 10/11/2014 08/03/2014 9/76/7341 03/22/7901  Systolic BP 409 735 329 924 268 341 962  Diastolic BP 90 229 82 94 798 92 80  Wt. (Lbs) 180.12 179.2 - 180 179.12 183 184.04  BMI 24.42 24.3 - 24.41 24.29 92.11 94.17        Helicobacter pylori gastritis Treated and asymptomatic  TUBULOVILLOUS ADENOMA, COLON Followed closely by his GI Doc, repeat colnoscopy will be determined by GI  Cocaine use Pt again encouraged / reminded of the need to discontinue use   Lack of physical exercise Still has not started exercise, states he is "not motivated', has the complication of uncontrolled hypertension again encouraged to change and adopt healthy lifestyle. He is working on this

## 2015-02-03 NOTE — Assessment & Plan Note (Signed)
Followed closely by his GI Doc, repeat colnoscopy will be determined by GI

## 2015-02-03 NOTE — Assessment & Plan Note (Signed)
Treated and asymptomatic 

## 2015-02-03 NOTE — Assessment & Plan Note (Signed)
Pt again encouraged / reminded of the need to discontinue use

## 2015-02-14 ENCOUNTER — Other Ambulatory Visit: Payer: Self-pay | Admitting: Family Medicine

## 2015-04-11 ENCOUNTER — Other Ambulatory Visit: Payer: Self-pay | Admitting: Family Medicine

## 2015-04-15 ENCOUNTER — Other Ambulatory Visit: Payer: Self-pay | Admitting: Family Medicine

## 2015-04-22 DIAGNOSIS — R809 Proteinuria, unspecified: Secondary | ICD-10-CM | POA: Diagnosis not present

## 2015-04-22 DIAGNOSIS — Z79899 Other long term (current) drug therapy: Secondary | ICD-10-CM | POA: Diagnosis not present

## 2015-04-22 DIAGNOSIS — N183 Chronic kidney disease, stage 3 (moderate): Secondary | ICD-10-CM | POA: Diagnosis not present

## 2015-04-22 DIAGNOSIS — E559 Vitamin D deficiency, unspecified: Secondary | ICD-10-CM | POA: Diagnosis not present

## 2015-04-22 DIAGNOSIS — I10 Essential (primary) hypertension: Secondary | ICD-10-CM | POA: Diagnosis not present

## 2015-04-22 DIAGNOSIS — D649 Anemia, unspecified: Secondary | ICD-10-CM | POA: Diagnosis not present

## 2015-04-26 DIAGNOSIS — N182 Chronic kidney disease, stage 2 (mild): Secondary | ICD-10-CM | POA: Diagnosis not present

## 2015-04-26 DIAGNOSIS — E870 Hyperosmolality and hypernatremia: Secondary | ICD-10-CM | POA: Diagnosis not present

## 2015-04-26 DIAGNOSIS — I10 Essential (primary) hypertension: Secondary | ICD-10-CM | POA: Diagnosis not present

## 2015-04-26 DIAGNOSIS — R809 Proteinuria, unspecified: Secondary | ICD-10-CM | POA: Diagnosis not present

## 2015-06-20 ENCOUNTER — Other Ambulatory Visit: Payer: Self-pay | Admitting: Family Medicine

## 2015-06-20 ENCOUNTER — Other Ambulatory Visit: Payer: Self-pay

## 2015-06-20 MED ORDER — OMEPRAZOLE 20 MG PO CPDR: 20.0000 mg | DELAYED_RELEASE_CAPSULE | Freq: Every day | ORAL | Status: DC

## 2015-06-20 MED ORDER — AMLODIPINE BESYLATE 5 MG PO TABS: 5.0000 mg | ORAL_TABLET | Freq: Every day | ORAL | Status: DC

## 2015-06-20 NOTE — Telephone Encounter (Addendum)
Upcoming appointment: 07/03/2015  90 Day supply request for Omeprazole 20 mg DR tablets and Amlodipine 5 mg tab.

## 2015-07-03 ENCOUNTER — Ambulatory Visit (INDEPENDENT_AMBULATORY_CARE_PROVIDER_SITE_OTHER): Payer: Medicare Other | Admitting: Family Medicine

## 2015-07-03 ENCOUNTER — Encounter: Payer: Self-pay | Admitting: Family Medicine

## 2015-07-03 VITALS — BP 120/76 | HR 56 | Resp 16 | Ht 72.0 in | Wt 173.4 lb

## 2015-07-03 DIAGNOSIS — K219 Gastro-esophageal reflux disease without esophagitis: Secondary | ICD-10-CM

## 2015-07-03 DIAGNOSIS — I10 Essential (primary) hypertension: Secondary | ICD-10-CM

## 2015-07-03 DIAGNOSIS — J209 Acute bronchitis, unspecified: Secondary | ICD-10-CM | POA: Diagnosis not present

## 2015-07-03 DIAGNOSIS — Z125 Encounter for screening for malignant neoplasm of prostate: Secondary | ICD-10-CM

## 2015-07-03 DIAGNOSIS — Z1159 Encounter for screening for other viral diseases: Secondary | ICD-10-CM | POA: Diagnosis not present

## 2015-07-03 DIAGNOSIS — D649 Anemia, unspecified: Secondary | ICD-10-CM | POA: Diagnosis not present

## 2015-07-03 MED ORDER — PENICILLIN V POTASSIUM 500 MG PO TABS: 500.0000 mg | ORAL_TABLET | Freq: Three times a day (TID) | ORAL | Status: DC

## 2015-07-03 MED ORDER — BENZONATATE 100 MG PO CAPS: 100.0000 mg | ORAL_CAPSULE | Freq: Two times a day (BID) | ORAL | Status: DC

## 2015-07-03 NOTE — Progress Notes (Signed)
   Subjective:    Patient ID: Marc Ward, male    DOB: [birth year], 64 y.o.   MRN: [id number]  HPI   Marc Ward     MRN: [id number]      DOB: Apr 18, 1951   HPI Marc Ward is here for follow up and re-evaluation of chronic medical conditions, medication management and review of any available recent lab and radiology data.  Preventive health is updated, specifically  Cancer screening and Immunization.    The PT denies any adverse reactions to current medications since the last visit.  1 week h/o worsening cough and chest congestion, sputum is now green, and he also has intermittent chills and low grade fever. Feels weak   ROS Denies recent fever or chills. Denies sinus pressure, nasal congestion, ear pain or sore throat.  Denies chest pains, palpitations and leg swelling Denies abdominal pain, nausea, vomiting,diarrhea or constipation.   Denies dysuria, frequency, hesitancy or incontinence. Denies joint pain, swelling and limitation in mobility. Denies headaches, seizures, numbness, or tingling. Denies depression, anxiety or insomnia. Denies skin break down or rash.   PE  BP 120/76 mmHg  Pulse 56  Resp 16  Ht 6' (1.829 m)  Wt 173 lb 6.4 oz (78.654 kg)  BMI 23.51 kg/m2  SpO2 97%  Patient alert and oriented and in no cardiopulmonary distress.  HEENT: No facial asymmetry, EOMI,   oropharynx pink and moist.  Neck supple no JVD, no mass. No sinus tenderness, TM clear Chest: scattered crackles , no wheezes, adequate air entry bilaterally  CVS: S1, S2 no murmurs, no S3.Regular rate.  ABD: Soft non tender.   Ext: No edema  MS: Adequate ROM spine, shoulders, hips and knees.  Skin: Intact, no ulcerations or rash noted.  Psych: Good eye contact, normal affect. Memory intact not anxious or depressed appearing.  CNS: CN 2-12 intact, power,  normal throughout.no focal deficits noted.   Assessment & Plan  Acute bronchitis 1 week history of worsening symptoms, antibiotic and  decongestant prescribed  Essential hypertension Controlled, no change in medication DASH diet and commitment to daily physical activity for a minimum of 30 minutes discussed and encouraged, as a part of hypertension management. The importance of attaining a healthy weight is also discussed.  BP/Weight 07/03/2015 01/31/2015 11/02/2014 11/01/2014 10/11/2014 08/03/2014 Q000111Q  Systolic BP 123456 123456 0000000 [id number] [id number] 123456  Diastolic BP 76 90 123XX123 82 94 100 92  Wt. (Lbs) 173.4 180.12 179.2 - 180 179.12 183  BMI 23.51 24.42 24.3 - 24.41 24.29 24.81        GERD (gastroesophageal reflux disease) Controlled, no change in medication        Review of Systems     Objective:   Physical Exam        Assessment & Plan:

## 2015-07-03 NOTE — Patient Instructions (Signed)
Annual physical exam early Feb, call if you need me sooner  Return in 2 to 3 weeks for flu vaccine  You are treated for acute bronchitis  Fasting lipid, pSA, cmp , and eGFR, CBC and hep C screen Jan 18 or after  Thanks for choosing [facility], we consider it a privelige to serve you.  All the beast for 2017! 

## 2015-07-04 DIAGNOSIS — K219 Gastro-esophageal reflux disease without esophagitis: Secondary | ICD-10-CM | POA: Insufficient documentation

## 2015-07-04 NOTE — Assessment & Plan Note (Signed)
1 week history of worsening symptoms, antibiotic and decongestant prescribed

## 2015-07-04 NOTE — Assessment & Plan Note (Signed)
Controlled, no change in medication DASH diet and commitment to daily physical activity for a minimum of 30 minutes discussed and encouraged, as a part of hypertension management. The importance of attaining a healthy weight is also discussed.  BP/Weight 07/03/2015 01/31/2015 11/02/2014 11/01/2014 10/11/2014 08/03/2014 [id number]  Systolic BP 123456 123456 [id number] [id number] 123456  Diastolic BP 76 90 123XX123 82 94 100 92  Wt. (Lbs) 173.4 180.12 179.2 - 180 179.12 183  BMI 23.51 24.42 24.3 - 24.41 24.29 24.81

## 2015-07-04 NOTE — Assessment & Plan Note (Signed)
Controlled, no change in medication  

## 2015-07-29 ENCOUNTER — Encounter: Payer: Self-pay | Admitting: Family Medicine

## 2015-07-29 ENCOUNTER — Ambulatory Visit (INDEPENDENT_AMBULATORY_CARE_PROVIDER_SITE_OTHER): Payer: Medicare Other | Admitting: Family Medicine

## 2015-07-29 VITALS — BP 120/76 | HR 90 | Resp 18 | Ht 72.0 in | Wt 175.0 lb

## 2015-07-29 DIAGNOSIS — I10 Essential (primary) hypertension: Secondary | ICD-10-CM | POA: Diagnosis not present

## 2015-07-29 DIAGNOSIS — L0591 Pilonidal cyst without abscess: Secondary | ICD-10-CM | POA: Insufficient documentation

## 2015-07-29 DIAGNOSIS — Z23 Encounter for immunization: Secondary | ICD-10-CM | POA: Diagnosis not present

## 2015-07-29 MED ORDER — LEVOFLOXACIN 500 MG PO TABS: 500.0000 mg | ORAL_TABLET | Freq: Every day | ORAL | Status: DC

## 2015-07-29 NOTE — Patient Instructions (Signed)
Annual wellness in April, call if you need me sooner  1 week course of levaquin , one daily , sent for the boil , take entire course  Flu vaccine today  No changes in medication

## 2015-07-29 NOTE — Progress Notes (Signed)
   Subjective:    Patient ID: Marc Ward, male    DOB: 09-08-1950, 65 y.o.   MRN: [id number]  HPI  4 day h/o painful boil to butock , reportedly drainaing, first episdode, reports chills and fatigue. Prior to this has has been well  Review of Systems See HPI Denies recent fever or chills. Denies sinus pressure, nasal congestion, ear pain or sore throat. Denies chest congestion, productive cough or wheezing. Denies chest pains, palpitations and leg swelling Denies abdominal pain, nausea, vomiting,diarrhea or constipation.   Denies dysuria, frequency, hesitancy or incontinence.        Objective:   Physical Exam  BP 120/76 mmHg  Pulse 90  Resp 18  Ht 6' (1.829 m)  Wt 175 lb (79.379 kg)  BMI 23.73 kg/m2  SpO2 98% Patient alert and oriented and in no cardiopulmonary distress.  HEENT: No facial asymmetry, EOMI,   oropharynx pink and moist.  Neck supple no JVD, no mass.  Chest: Clear to auscultation bilaterally.  CVS: S1, S2 no murmurs, no S3.Regular rate.  ABD: Soft non tender.   Ext: No edema  Skin: Intact, pea sized painful boil in perineum between penis and anus, no visible drainage noted  Psych: Good eye contact, normal affect. Memory intact not anxious or depressed appearing.  CNS: CN 2-12 intact, power,  normal throughout.no focal deficits noted.       Assessment & Plan:  Infected pilonidal cyst Small infected cyst, however pt very symptomatic, levaquin prescribed x 7 days  Essential hypertension Controlled, no change in medication

## 2015-07-29 NOTE — Assessment & Plan Note (Signed)
Small infected cyst, however pt very symptomatic, levaquin prescribed x 7 days

## 2015-07-29 NOTE — Assessment & Plan Note (Signed)
Controlled, no change in medication  

## 2015-07-30 ENCOUNTER — Encounter (HOSPITAL_COMMUNITY): Payer: Self-pay | Admitting: Emergency Medicine

## 2015-07-30 ENCOUNTER — Observation Stay (HOSPITAL_COMMUNITY)
Admission: EM | Admit: 2015-07-30 | Discharge: 2015-07-31 | Disposition: A | Discharge status: Home / Self Care | Payer: Medicare Other | Attending: Internal Medicine | Admitting: Internal Medicine

## 2015-07-30 DIAGNOSIS — Z7982 Long term (current) use of aspirin: Secondary | ICD-10-CM | POA: Insufficient documentation

## 2015-07-30 DIAGNOSIS — D573 Sickle-cell trait: Secondary | ICD-10-CM | POA: Diagnosis not present

## 2015-07-30 DIAGNOSIS — F1110 Opioid abuse, uncomplicated: Secondary | ICD-10-CM | POA: Insufficient documentation

## 2015-07-30 DIAGNOSIS — R9431 Abnormal electrocardiogram [ECG] [EKG]: Secondary | ICD-10-CM | POA: Insufficient documentation

## 2015-07-30 DIAGNOSIS — D649 Anemia, unspecified: Secondary | ICD-10-CM | POA: Diagnosis not present

## 2015-07-30 DIAGNOSIS — R55 Syncope and collapse: Secondary | ICD-10-CM | POA: Diagnosis not present

## 2015-07-30 DIAGNOSIS — I10 Essential (primary) hypertension: Secondary | ICD-10-CM | POA: Diagnosis not present

## 2015-07-30 DIAGNOSIS — Z8619 Personal history of other infectious and parasitic diseases: Secondary | ICD-10-CM | POA: Insufficient documentation

## 2015-07-30 DIAGNOSIS — F1410 Cocaine abuse, uncomplicated: Secondary | ICD-10-CM | POA: Diagnosis not present

## 2015-07-30 DIAGNOSIS — I951 Orthostatic hypotension: Secondary | ICD-10-CM | POA: Diagnosis not present

## 2015-07-30 DIAGNOSIS — F1490 Cocaine use, unspecified, uncomplicated: Secondary | ICD-10-CM | POA: Diagnosis present

## 2015-07-30 DIAGNOSIS — F1910 Other psychoactive substance abuse, uncomplicated: Secondary | ICD-10-CM

## 2015-07-30 DIAGNOSIS — Z792 Long term (current) use of antibiotics: Secondary | ICD-10-CM | POA: Insufficient documentation

## 2015-07-30 DIAGNOSIS — Z79899 Other long term (current) drug therapy: Secondary | ICD-10-CM | POA: Diagnosis not present

## 2015-07-30 DIAGNOSIS — Z86018 Personal history of other benign neoplasm: Secondary | ICD-10-CM | POA: Diagnosis not present

## 2015-07-30 DIAGNOSIS — R0682 Tachypnea, not elsewhere classified: Secondary | ICD-10-CM | POA: Diagnosis not present

## 2015-07-30 DIAGNOSIS — R42 Dizziness and giddiness: Secondary | ICD-10-CM | POA: Diagnosis not present

## 2015-07-30 DIAGNOSIS — R404 Transient alteration of awareness: Secondary | ICD-10-CM | POA: Diagnosis not present

## 2015-07-30 NOTE — ED Notes (Signed)
Patient states he passed out approximately 30 minutes prior to arrival. States "I was just standing up eating some shrimp and I just passed out." Patient denies pain at this time. Denies injury from fall.

## 2015-07-31 ENCOUNTER — Observation Stay (HOSPITAL_BASED_OUTPATIENT_CLINIC_OR_DEPARTMENT_OTHER): Payer: Medicare Other

## 2015-07-31 ENCOUNTER — Encounter (HOSPITAL_COMMUNITY): Payer: Self-pay | Admitting: Internal Medicine

## 2015-07-31 DIAGNOSIS — F1110 Opioid abuse, uncomplicated: Secondary | ICD-10-CM | POA: Diagnosis not present

## 2015-07-31 DIAGNOSIS — R55 Syncope and collapse: Secondary | ICD-10-CM

## 2015-07-31 DIAGNOSIS — I5032 Chronic diastolic (congestive) heart failure: Secondary | ICD-10-CM

## 2015-07-31 DIAGNOSIS — F1410 Cocaine abuse, uncomplicated: Secondary | ICD-10-CM

## 2015-07-31 DIAGNOSIS — R9431 Abnormal electrocardiogram [ECG] [EKG]: Secondary | ICD-10-CM

## 2015-07-31 DIAGNOSIS — I10 Essential (primary) hypertension: Secondary | ICD-10-CM

## 2015-07-31 DIAGNOSIS — I951 Orthostatic hypotension: Secondary | ICD-10-CM | POA: Diagnosis not present

## 2015-07-31 LAB — URINALYSIS, ROUTINE W REFLEX MICROSCOPIC
Bilirubin Urine: NEGATIVE
GLUCOSE, UA: NEGATIVE mg/dL
KETONES UR: NEGATIVE mg/dL
LEUKOCYTES UA: NEGATIVE
NITRITE: NEGATIVE
PH: 5 (ref 5.0–8.0)
Protein, ur: 100 mg/dL — AB
SPECIFIC GRAVITY, URINE: 1.025 (ref 1.005–1.030)

## 2015-07-31 LAB — BASIC METABOLIC PANEL
ANION GAP: 9 (ref 5–15)
BUN: 17 mg/dL (ref 6–20)
CHLORIDE: 105 mmol/L (ref 101–111)
CO2: 26 mmol/L (ref 22–32)
Calcium: 9 mg/dL (ref 8.9–10.3)
Creatinine, Ser: 1.28 mg/dL — ABNORMAL HIGH (ref 0.61–1.24)
GFR calc Af Amer: >60 mL/min (ref >60)
GFR, EST NON AFRICAN AMERICAN: 57 mL/min — AB (ref >60)
GLUCOSE: 133 mg/dL — AB (ref 65–99)
POTASSIUM: 3.2 mmol/L — AB (ref 3.5–5.1)
Sodium: 140 mmol/L (ref 135–145)

## 2015-07-31 LAB — CBC
HCT: 30.9 % — ABNORMAL LOW (ref 39.0–52.0)
HEMATOCRIT: 34.8 % — AB (ref 39.0–52.0)
HEMOGLOBIN: 11.8 g/dL — AB (ref 13.0–17.0)
Hemoglobin: 10.4 g/dL — ABNORMAL LOW (ref 13.0–17.0)
MCH: 32.4 pg (ref 26.0–34.0)
MCH: 32.8 pg (ref 26.0–34.0)
MCHC: 33.7 g/dL (ref 30.0–36.0)
MCHC: 33.9 g/dL (ref 30.0–36.0)
MCV: 96.3 fL (ref 78.0–100.0)
MCV: 96.7 fL (ref 78.0–100.0)
PLATELETS: 115 10*3/uL — AB (ref 150–400)
Platelets: 121 10*3/uL — ABNORMAL LOW (ref 150–400)
RBC: 3.21 MIL/uL — ABNORMAL LOW (ref 4.22–5.81)
RBC: 3.6 MIL/uL — ABNORMAL LOW (ref 4.22–5.81)
RDW: 12.6 % (ref 11.5–15.5)
RDW: 12.7 % (ref 11.5–15.5)
WBC: 5.2 10*3/uL (ref 4.0–10.5)
WBC: 6.1 10*3/uL (ref 4.0–10.5)

## 2015-07-31 LAB — RAPID URINE DRUG SCREEN, HOSP PERFORMED
AMPHETAMINES: NOT DETECTED
BARBITURATES: NOT DETECTED
BENZODIAZEPINES: NOT DETECTED
COCAINE: POSITIVE — AB
OPIATES: POSITIVE — AB
TETRAHYDROCANNABINOL: NOT DETECTED

## 2015-07-31 LAB — COMPREHENSIVE METABOLIC PANEL
ALBUMIN: 3.1 g/dL — AB (ref 3.5–5.0)
ALT: 14 U/L — AB (ref 17–63)
AST: 18 U/L (ref 15–41)
Alkaline Phosphatase: 45 U/L (ref 38–126)
Anion gap: 7 (ref 5–15)
BILIRUBIN TOTAL: 1 mg/dL (ref 0.3–1.2)
BUN: 13 mg/dL (ref 6–20)
CO2: 27 mmol/L (ref 22–32)
CREATININE: 1.19 mg/dL (ref 0.61–1.24)
Calcium: 8.1 mg/dL — ABNORMAL LOW (ref 8.9–10.3)
Chloride: 110 mmol/L (ref 101–111)
GFR calc Af Amer: >60 mL/min (ref >60)
GLUCOSE: 116 mg/dL — AB (ref 65–99)
Potassium: 3.5 mmol/L (ref 3.5–5.1)
Sodium: 144 mmol/L (ref 135–145)
TOTAL PROTEIN: 5.9 g/dL — AB (ref 6.5–8.1)

## 2015-07-31 LAB — CBG MONITORING, ED: Glucose-Capillary: 121 mg/dL — ABNORMAL HIGH (ref 65–99)

## 2015-07-31 LAB — TROPONIN I
Troponin I: <0.03 ng/mL (ref <0.031)
Troponin I: <0.03 ng/mL (ref <0.031)

## 2015-07-31 LAB — URINE MICROSCOPIC-ADD ON

## 2015-07-31 LAB — TSH: TSH: 1.879 u[IU]/mL (ref 0.350–4.500)

## 2015-07-31 MED ORDER — ENOXAPARIN SODIUM 40 MG/0.4ML ~~LOC~~ SOLN: 40.0000 mg | SUBCUTANEOUS | Status: DC

## 2015-07-31 MED ORDER — ASPIRIN EC 81 MG PO TBEC
81.0000 mg | DELAYED_RELEASE_TABLET | Freq: Every day | ORAL | Status: DC
  Administered 2015-07-31: 81 mg via ORAL
  Filled 2015-07-31: qty 1

## 2015-07-31 MED ORDER — CEFAZOLIN SODIUM 1-5 GM-% IV SOLN
1.0000 g | Freq: Three times a day (TID) | INTRAVENOUS | Status: DC
  Administered 2015-07-31 (×2): 1 g via INTRAVENOUS
  Filled 2015-07-31 (×5): qty 50

## 2015-07-31 MED ORDER — ENOXAPARIN SODIUM 40 MG/0.4ML ~~LOC~~ SOLN
40.0000 mg | SUBCUTANEOUS | Status: DC
  Administered 2015-07-31: 40 mg via SUBCUTANEOUS

## 2015-07-31 MED ORDER — PANTOPRAZOLE SODIUM 40 MG PO TBEC
40.0000 mg | DELAYED_RELEASE_TABLET | Freq: Every day | ORAL | Status: DC
  Administered 2015-07-31: 40 mg via ORAL

## 2015-07-31 MED ORDER — CEFAZOLIN SODIUM 1-5 GM-% IV SOLN
INTRAVENOUS | Status: AC
  Filled 2015-07-31: qty 50

## 2015-07-31 MED ORDER — VITAMIN D 1000 UNITS PO TABS
1000.0000 [IU] | ORAL_TABLET | Freq: Every day | ORAL | Status: DC
  Administered 2015-07-31: 1000 [IU] via ORAL
  Filled 2015-07-31: qty 1

## 2015-07-31 MED ORDER — ONDANSETRON HCL 4 MG PO TABS: 4.0000 mg | ORAL_TABLET | Freq: Four times a day (QID) | ORAL | Status: DC | PRN

## 2015-07-31 MED ORDER — SODIUM CHLORIDE 0.9 % IV SOLN
INTRAVENOUS | Status: AC
  Administered 2015-07-31: via INTRAVENOUS

## 2015-07-31 MED ORDER — LISINOPRIL 5 MG PO TABS: 5.0000 mg | ORAL_TABLET | Freq: Every day | ORAL | Status: DC

## 2015-07-31 MED ORDER — SODIUM CHLORIDE 0.9 % IJ SOLN
3.0000 mL | Freq: Two times a day (BID) | INTRAMUSCULAR | Status: DC
  Administered 2015-07-31: 3 mL via INTRAVENOUS

## 2015-07-31 MED ORDER — ONDANSETRON HCL 4 MG/2ML IJ SOLN: 4.0000 mg | Freq: Four times a day (QID) | INTRAMUSCULAR | Status: DC | PRN

## 2015-07-31 MED ORDER — SODIUM CHLORIDE 0.9 % IV BOLUS (SEPSIS)
1000.0000 mL | Freq: Once | INTRAVENOUS | Status: AC
  Administered 2015-07-31: 1000 mL via INTRAVENOUS

## 2015-07-31 NOTE — Progress Notes (Signed)
Dr. [place] Pink notified of patient ambulating, called room and talked to patient about discharge.

## 2015-07-31 NOTE — Progress Notes (Signed)
Down via wheelchair accompanied by staff for discharge home in care of family.  Stable at discharge. 

## 2015-07-31 NOTE — ED Provider Notes (Signed)
TIME SEEN: 12:00 AM  CHIEF COMPLAINT: syncope  HPI: Pt is a 65 y.o. male with history of hypertension, substance abuse who presents to the emergency department with a syncopal event. States that he was standing in his kitchen for 15-20 minutes daily shrimp when he felt hot and lightheaded and passed out. Denies any injury from the fall. Wife denies that he hit his head. He is not on anticoagulation. He denies any chest pain, shortness of breath or palpitations. No recent vomiting or diarrhea. Reports he has had a good appetite. No history of PE or DVT. No history of headache, numbness, tingling or focal weakness. Patient reports he has had similar episodes of syncope in the past when he has "felt hot". He has never been evaluated by a physician for this. His PCP is Dr. Moshe Cipro. He is currently on antibiotics for a small abscess near his rectum. He did not have incision and drainage of this area. States he has had subjective fevers at home and has been taking Tylenol.  Denies bloody stools or melena.  ROS: See HPI Constitutional: subjective fever  Eyes: no drainage  ENT: no runny nose   Cardiovascular:  no chest pain  Resp: no SOB  GI: no vomiting or diarrhea GU: no dysuria Integumentary: no rash  Allergy: no hives  Musculoskeletal: no leg swelling  Neurological: no slurred speech ROS otherwise negative  PAST MEDICAL HISTORY/PAST SURGICAL HISTORY:  Past Medical History  Diagnosis Date  . Sickle cell trait ([place])   . Hypertension 2009  . H. pylori infection FEB 2011    ABO BID x1O DAYS  . IDA (iron deficiency anemia)   . Tubulovillous adenoma of colon 2013    currently under close surveillance by GI  . Substance use disorder     cocaine and marijuana    MEDICATIONS:  Prior to Admission medications   Medication Sig Start Date End Date Taking? Authorizing Provider  amLODipine (NORVASC) 5 MG tablet Take 1 tablet (5 mg total) by mouth daily. 06/20/15   Fayrene Helper, MD  aspirin 81  MG tablet Take 81 mg by mouth daily.    Historical Provider, MD  cholecalciferol (VITAMIN D) 1000 UNITS tablet Take 1,000 Units by mouth daily.    Historical Provider, MD  levofloxacin (LEVAQUIN) 500 MG tablet Take 1 tablet (500 mg total) by mouth daily. 07/29/15   Fayrene Helper, MD  lisinopril (PRINIVIL,ZESTRIL) 20 MG tablet TAKE 1 TABLET BY MOUTH EVERY DAY 04/12/15   Fayrene Helper, MD  omeprazole (PRILOSEC) 20 MG capsule Take 1 capsule (20 mg total) by mouth daily. 06/20/15   Fayrene Helper, MD    ALLERGIES:  No Known Allergies  SOCIAL HISTORY:  Social History  Substance Use Topics  . Smoking status: Never Smoker   . Smokeless tobacco: Never Used  . Alcohol Use: Yes     Comment: occasional drinker-2 beers/wk    FAMILY HISTORY: Family History  Problem Relation Age of Onset  . Hypertension Mother   . Diabetes Father   . Hypertension Brother   . Colon cancer Neg Hx     EXAM: BP 144/107 mmHg  Pulse 72  Temp(Src) 97.5 F (36.4 C) (Oral)  Resp 24  Ht 6' (1.829 m)  Wt 175 lb (79.379 kg)  BMI 23.73 kg/m2  SpO2 96% CONSTITUTIONAL: Alert and oriented and responds appropriately to questions. Well-appearing; well-nourished HEAD: Normocephalic, atraumatic EYES: Conjunctivae clear, PERRL, no conjunctival pallor ENT: normal nose; no rhinorrhea; moist mucous membranes;  pharynx without lesions noted NECK: Supple, no meningismus, no LAD, no midline spinal tenderness or step-off or deformity  CARD: RRR; S1 and S2 appreciated; no murmurs, no clicks, no rubs, no gallops RESP: Normal chest excursion without splinting or tachypnea; breath sounds clear and equal bilaterally; no wheezes, no rhonchi, no rales, no hypoxia or respiratory distress, speaking full sentences ABD/GI: Normal bowel sounds; non-distended; soft, non-tender, no rebound, no guarding, no peritoneal signs RECTUM:  Patient has a 1 x 2 cm slightly indurated area with a small amount of purulent drainage at the 3:00  position next to the rectum but does not appear to involve the rectum, no gross blood or melena on exam, no cellulitis, no subcutaneous air in the perineum, no sinus 40s gangrene BACK:  The back appears normal and is non-tender to palpation, there is no CVA tenderness EXT: Normal ROM in all joints; non-tender to palpation; no edema; normal capillary refill; no cyanosis, no calf tenderness or swelling    SKIN: Normal color for age and race; warm NEURO: Moves all extremities equally, sensation to light touch intact diffusely, cranial nerves II through XII intact PSYCH: The patient's mood and manner are appropriate. Grooming and personal hygiene are appropriate.  MEDICAL DECISION MAKING: Pt here with syncopal event. He does have positive orthostatic vital signs. States however when he stood up he did not feel lightheaded in the emergency department. We'll give IV fluids. EKG just shows some T-wave flattening and T-wave inversions are new compared to prior.  Will obtain labs, urine.  ED PROGRESS: Patient's creatinine is mildly elevated at 1.28, he is receiving IV hydration. Hemoglobin is stable. Troponin is negative. He is positive for opiates and cocaine. Even his sncopal event tonight with T-wave changes on EKG I have recommended admission for ACS rule out, echocardiogram. Patient agrees.  PCP is Dr. Moshe Cipro.  1:00 AM  D.w Dr. Marin Comment who agrees on admission for obs.     EKG Interpretation  Date/Time:  Tuesday July 30 2015 23:30:25 EST Ventricular Rate:  70 PR Interval:  173 QRS Duration: 93 QT Interval:  389 QTC Calculation: 420 R Axis:   27 Text Interpretation:  Sinus rhythm Nonspecific T abnormalities, diffuse leads Confirmed by Jolon Degante,  DO, Cheryel Kyte [phone]) on 07/31/2015 12:09:30 AM         Delice Bison Abdullah Rizzi, DO 07/31/15 [id number]

## 2015-07-31 NOTE — Progress Notes (Signed)
Ambulated from room, around back home, returned to room, gait steady, no respiratory distress.  [place] Pink informed.

## 2015-07-31 NOTE — Care Management Obs Status (Signed)
[place] NOTIFICATION   Patient Details  Name: Marc Ward MRN: [id number] Date of Birth: Nov 01, 1950   Medicare Observation Status Notification Given:  No Pt discharging within 24 hours.   Christinia Gully [place], RN 07/31/2015, 2:14 PM

## 2015-07-31 NOTE — H&P (Signed)
[facility] History and Physical  Marc Ward [id number] DOB: August 28, 1950    PCP:   Tula Nakayama, MD   Chief Complaint: syncope.   HPI: Marc Ward is an 65 y.o. male with hx of HTN on Norvasc and Zesteretics, hx of Substance abuse, and tested positive tonight for cocaine and opoids, hx of sickle cell trait, was eating schrimp and felt lightheaded and had LOC.  He did not feel palpitation or having any chest pain.  He was a boil on his buttock area, and has been taking antibiotic for it.  He has some subjective fever.  He had one prior syncope many years ago.  Work up in the ER included an EKG, which showed nonspecific T wave inversion on the precordial leads and ST depression.  His labs were unremarkable except for Cr of 1.28, and K of 3.2.  His Hb was 11.8 g per dL with normal WBC.  He was orthostatic in the ER.  He was subsequently given IVF and hospitalist was asked to admit him for syncope, suspicious of volume depletion, but with abnormal EKGs.   Rewiew of Systems:  Constitutional: Negative for malaise, fever and chills. No significant weight loss or weight gain Eyes: Negative for eye pain, redness and discharge, diplopia, visual changes, or flashes of light. ENMT: Negative for ear pain, hoarseness, nasal congestion, sinus pressure and sore throat. No headaches; tinnitus, drooling, or problem swallowing. Cardiovascular: Negative for chest pain, palpitations, diaphoresis, dyspnea and peripheral edema. ; No orthopnea, PND Respiratory: Negative for cough, hemoptysis, wheezing and stridor. No pleuritic chestpain. Gastrointestinal: Negative for nausea, vomiting, diarrhea, constipation, abdominal pain, melena, blood in stool, hematemesis, jaundice and rectal bleeding.    Genitourinary: Negative for frequency, dysuria, incontinence,flank pain and hematuria; Musculoskeletal: Negative for back pain and neck pain. Negative for swelling and trauma.;  Skin: . Negative for pruritus, rash,  abrasions, bruising and skin lesion.; ulcerations Neuro: Negative for headache, and neck stiffness. Negative for weakness,  altered mental status, extremity weakness, burning feet, involuntary movement, seizure and syncope.  Psych: negative for anxiety, depression, insomnia, tearfulness, panic attacks, hallucinations, paranoia, suicidal or homicidal ideation   Past Medical History  Diagnosis Date  . Sickle cell trait ([place])   . Hypertension 2009  . H. pylori infection FEB 2011    ABO BID x1O DAYS  . IDA (iron deficiency anemia)   . Tubulovillous adenoma of colon 2013    currently under close surveillance by GI  . Substance use disorder     cocaine and marijuana    Past Surgical History  Procedure Laterality Date  . Tonsillectomy    . Adenoidectomy    . Circumcision    . Colonoscopy  AUG 2010    [facility]ED [place] ADENOMA, TICS,   . Upper gastrointestinal endoscopy  FEB 2011    H. PYLORI  . Laparotomy  11/08/2011    Procedure: EXPLORATORY LAPAROTOMY;  Surgeon: Jamesetta So, MD;  Location: [facility];  Service: General;  Laterality: N/A;  . Bowel resection  11/08/2011    Procedure: SMALL BOWEL RESECTION;  Surgeon: Jamesetta So, MD;  Location: [facility];  Service: General;  Laterality: N/A;    Medications:  HOME MEDS: Prior to Admission medications   Medication Sig Start Date End Date Taking? Authorizing Provider  amLODipine (NORVASC) 5 MG tablet Take 1 tablet (5 mg total) by mouth daily. 06/20/15   Fayrene Helper, MD  aspirin 81 MG tablet Take 81 mg by mouth daily.    Historical  Provider, MD  cholecalciferol (VITAMIN D) 1000 UNITS tablet Take 1,000 Units by mouth daily.    Historical Provider, MD  levofloxacin (LEVAQUIN) 500 MG tablet Take 1 tablet (500 mg total) by mouth daily. 07/29/15   Fayrene Helper, MD  lisinopril (PRINIVIL,ZESTRIL) 20 MG tablet TAKE 1 TABLET BY MOUTH EVERY DAY 04/12/15   Fayrene Helper, MD  omeprazole (PRILOSEC) 20 MG capsule Take 1 capsule (20 mg total) by  mouth daily. 06/20/15   Fayrene Helper, MD     Allergies:  No Known Allergies  Social History:   reports that he has never smoked. He has never used smokeless tobacco. He reports that he drinks alcohol. He reports that he uses illicit drugs (Marijuana and Cocaine) about once per week.  Family History: Family History  Problem Relation Age of Onset  . Hypertension Mother   . Diabetes Father   . Hypertension Brother   . Colon cancer Neg Hx      Physical Exam: Filed Vitals:   07/30/15 2333 07/31/15 0000 07/31/15 0100 07/31/15 0130  BP: 144/107 93/79 146/93 143/97  Pulse: 72 93 57 58  Temp: 97.5 F (36.4 C)     TempSrc: Oral     Resp: 24 18 18 18   Height: 6' (1.829 m)     Weight: 79.379 kg (175 lb)     SpO2: 96% 97% 97% 94%   Blood pressure 143/97, pulse 58, temperature 97.5 F (36.4 C), temperature source Oral, resp. rate 18, height 6' (1.829 m), weight 79.379 kg (175 lb), SpO2 94 %.  GEN:  Pleasant  patient lying in the stretcher in no acute distress; cooperative with exam. PSYCH:  alert and oriented x4; does not appear anxious or depressed; affect is appropriate. HEENT: Mucous membranes pink and anicteric; PERRLA; EOM intact; no cervical lymphadenopathy nor thyromegaly or carotid bruit; no JVD; There were no stridor. Neck is very supple. Breasts:: Not examined CHEST WALL: No tenderness CHEST: Normal respiration, clear to auscultation bilaterally.  HEART: Regular rate and rhythm.  There are no murmur, rub, or gallops.   BACK: No kyphosis or scoliosis; no CVA tenderness ABDOMEN: soft and non-tender; no masses, no organomegaly, normal abdominal bowel sounds; no pannus; no intertriginous candida. There is no rebound and no distention. Rectal Exam: Not done EXTREMITIES: No bone or joint deformity; age-appropriate arthropathy of the hands and knees; no edema; no ulcerations.  There is no calf tenderness. Genitalia: not examined PULSES: 2+ and symmetric SKIN: Normal  hydration no rash or ulceration CNS: Cranial nerves 2-12 grossly intact no focal lateralizing neurologic deficit.  Speech is fluent; uvula elevated with phonation, facial symmetry and tongue midline. DTR are normal bilaterally, cerebella exam is intact, barbinski is negative and strengths are equaled bilaterally.  No sensory loss.   Labs on Admission:  Basic Metabolic Panel:  Recent Labs Lab 07/30/15 2346  NA 140  K 3.2*  CL 105  CO2 26  GLUCOSE 133*  BUN 17  CREATININE 1.28*  CALCIUM 9.0   CBC:  Recent Labs Lab 07/30/15 2346  WBC 6.1  HGB 11.8*  HCT 34.8*  MCV 96.7  PLT 121*   Cardiac Enzymes:  Recent Labs Lab 07/30/15 2346  TROPONINI <0.03    CBG:  Recent Labs Lab 07/30/15 2357  GLUCAP 121*    EKG: Independently reviewed.     Assessment/Plan Present on Admission:  . Abnormal finding on EKG . Cocaine use . Essential hypertension  PLAN:  Syncope:  He has non specific  EKG changes, and will obtain ECHO and cycle troponins, though he had no CP, and unlikely to have had an ACS.  His BP was low, and he was orthostatic.  So will give him IVF, and I will stop his anti HTN meds.    HTN:  Given his BP dropped to 93, will hold Norvasc and hold his BP for now.  Avoid anti HTN causing orthostasis. Will give IVF as his Cr was elevated, and he appears volume depleted.  Cocaine Use:  Advised stop.  Inform it can precipitate a heart attack.  Cellulitis on buttock:  Will give IV Ancef while he is in the hospital.    Other plans as per orders. Code Status: FULL Haskel Khan, MD. FACP [facility] Pager [phone] 7pm to 7am.  07/31/2015, 1:53 AM

## 2015-07-31 NOTE — Discharge Summary (Signed)
Discharge Summary  Marc Ward [id number] DOB: Aug 04, 1950  PCP: Tula Nakayama, MD  Admit date: 07/30/2015 Discharge date: 07/31/2015  Time spent: 25 minutes   Recommendations for Outpatient Follow-up:  1. Patient is Norvasc input on hold 2. Patient's lisinopril decreased from 20 mg to 5 mg daily 3. Patient will follow-up with his PCP in the next 2 weeks. At that time, recommending repeat CBC to be checked and also as per PCP, titrating back up antihypertensives   Discharge Diagnoses:  Active Hospital Problems   Diagnosis Date Noted  . Syncope and collapse 07/31/2015  . Abnormal finding on EKG 10/13/2014  . Cocaine use 06/25/2013  . Essential hypertension [id number]  Chronic diastolic heart failure   Resolved Hospital Problems   Diagnosis Date Noted Date Resolved  No resolved problems to display.    Discharge Condition: Improved, being discharged home   Diet recommendation: Heart healthy   Filed Weights   07/30/15 2333  Weight: 79.379 kg (175 lb)    History of present illness:  65 year old male with past medical history of hypertension and substance abuse (cocaine and opioids) and currently taking antibiotics for draining boil on his buttocks presented to the emergency room after having a syncopal event while eating. Patient felt lightheaded before him. In the emergency room, patient noted to have creatinine of 1.28, normal white count and noted to be orthostatic with T-wave inversions seen on EKG. Patient brought in for observation.  Hospital Course:  Principal Problem:   Syncope and collapse: Unclear etiology, although suspect orthostasis from dehydration from recent infection. Noted hemoglobin dropped from 11.8 at admission to 10.4. Hemoccult reportedly negative. No signs of acute blood loss anemia. In review of his labs in the past, he has had hemoglobin level this low before. Recommending recheck of CBC at his follow-up PCP visit. Active Problems:   Essential  hypertension: Blood pressure on lower side when standing. With IV fluids, blood pressure improved and patient not orthostatic. We'll stop Norvasc and decrease lisinopril. Patient to have repeat check of blood pressure and follow-up visit and at that time medications can be titrated back orthostasis resolved. Noted borderline bradycardia and patient not on beta blockers.   Cocaine use: Patient counseled to quit. Do not think that this was coronary vasospasm as there is no associated chest pain and no change in troponins. Nevertheless, patient at high risk for cardiac event   Abnormal finding on EKG: Nonspecific T-wave inversions. Follow-up EKG looked about the same. EKG unremarkable. Echocardiogram only noted grade 1 diastolic dysfunction Chronic diastolic heart failure: Patient euvolemic. Already on lisinopril, would not put on beta blocker due to borderline bradycardia since admission  Procedures:  Echocardiogram [id number]: Grade 1 diastolic dysfunction   Consultations:  None   Discharge Exam: BP 130/93 mmHg  Pulse 78  Temp(Src) 98.8 F (37.1 C) (Oral)  Resp 18  Ht 6' (1.829 m)  Wt 79.379 kg (175 lb)  BMI 23.73 kg/m2  SpO2 96%  General: Alert and oriented 3  Cardiovascular: Regular rate and rhythm, Q000111Q, 2/6 systolic ejection murmur  Respiratory: Clear to auscultation bilaterally   Discharge Instructions You were cared for by a hospitalist during your hospital stay. If you have any questions about your discharge medications or the care you received while you were in the hospital after you are discharged, you can call the unit and asked to speak with the hospitalist on call if the hospitalist that took care of you is not available. Once you are discharged, your primary care  physician will handle any further medical issues. Please note that NO REFILLS for any discharge medications will be authorized once you are discharged, as it is imperative that you return to your primary care physician  (or establish a relationship with a primary care physician if you do not have one) for your aftercare needs so that they can reassess your need for medications and monitor your lab values.  Discharge Instructions    Diet - low sodium heart healthy    Complete by:  As directed      Increase activity slowly    Complete by:  As directed             Medication List    STOP taking these medications        amLODipine 5 MG tablet  Commonly known as:  NORVASC      TAKE these medications        acetaminophen 500 MG tablet  Commonly known as:  TYLENOL  Take 1,000 mg by mouth every 6 (six) hours as needed.     aspirin 81 MG tablet  Take 81 mg by mouth daily.     cholecalciferol 1000 units tablet  Commonly known as:  VITAMIN D  Take 1,000 Units by mouth daily.     dextromethorphan-guaiFENesin 30-600 MG 12hr tablet  Commonly known as:  MUCINEX DM  Take 1 tablet by mouth 2 (two) times daily.     levofloxacin 500 MG tablet  Commonly known as:  LEVAQUIN  Take 1 tablet (500 mg total) by mouth daily.     lisinopril 5 MG tablet  Commonly known as:  PRINIVIL,ZESTRIL  Take 1 tablet (5 mg total) by mouth daily.     omeprazole 20 MG capsule  Commonly known as:  PRILOSEC  Take 1 capsule (20 mg total) by mouth daily.       No Known Allergies    The results of significant diagnostics from this hospitalization (including imaging, microbiology, ancillary and laboratory) are listed below for reference.    Significant Diagnostic Studies: No results found.  Microbiology: No results found for this or any previous visit (from the past 240 hour(s)).   Labs: Basic Metabolic Panel:  Recent Labs Lab 07/30/15 2346 07/31/15 0553  NA 140 144  K 3.2* 3.5  CL 105 110  CO2 26 27  GLUCOSE 133* 116*  BUN 17 13  CREATININE 1.28* 1.19  CALCIUM 9.0 8.1*   Liver Function Tests:  Recent Labs Lab 07/31/15 0553  AST 18  ALT 14*  ALKPHOS 45  BILITOT 1.0  PROT 5.9*  ALBUMIN 3.1*    No results for input(s): LIPASE, AMYLASE in the last 168 hours. No results for input(s): AMMONIA in the last 168 hours. CBC:  Recent Labs Lab 07/30/15 2346 07/31/15 0553  WBC 6.1 5.2  HGB 11.8* 10.4*  HCT 34.8* 30.9*  MCV 96.7 96.3  PLT 121* 115*   Cardiac Enzymes:  Recent Labs Lab 07/30/15 2346 07/31/15 0239 07/31/15 0553 07/31/15 1419  TROPONINI <0.03 <0.03 <0.03 <0.03   BNP: BNP (last 3 results) No results for input(s): BNP in the last 8760 hours.  ProBNP (last 3 results) No results for input(s): PROBNP in the last 8760 hours.  CBG:  Recent Labs Lab 07/30/15 2357  GLUCAP 121*       Signed:  Kj Imbert K  [facility] 07/31/2015, 3:27 PM

## 2015-07-31 NOTE — Care Management Note (Signed)
Case Management Note  Patient Details  Name: Marc Ward MRN: [id number] Date of Birth: 04/15/1951  Subjective/Objective:                  Pt admitted from home with syncope. Pt lives with his mother and will return home at discharge. Pt is independent with ADL's.  Action/Plan: No CM needs noted.  Expected Discharge Date:                  Expected Discharge Plan:  Home/Self Care  In-House Referral:  NA  Discharge planning Services  CM Consult  Post Acute Care Choice:  NA Choice offered to:  NA  DME Arranged:    DME Agency:     HH Arranged:    HH Agency:     Status of Service:  Completed, signed off  Medicare Important Message Given:    Date Medicare IM Given:    Medicare IM give by:    Date Additional Medicare IM Given:    Additional Medicare Important Message give by:     If discussed at [place] of Stay Meetings, dates discussed:    Additional Comments:  Joylene Draft, RN 07/31/2015, 11:18 AM

## 2015-07-31 NOTE — Progress Notes (Signed)
IV access removed, discharge instructions reviewed with patient, questions answered, understanding verbalized.  Waiting ride home.

## 2015-07-31 NOTE — Progress Notes (Signed)
Central monitoring contacted this nurse to let me know that patient is having some new Afib and Accelerated Junctional Rhythms.  I verified the rhythms.  MD notified.

## 2015-07-31 NOTE — Progress Notes (Signed)
**Note De-identified Keyston Ardolino Obfuscation** Abnormal EKG results reported to RN 

## 2015-07-31 NOTE — Progress Notes (Signed)
Central Telemetry notified of patient's discharge, telemetry removed.

## 2015-08-12 ENCOUNTER — Ambulatory Visit (INDEPENDENT_AMBULATORY_CARE_PROVIDER_SITE_OTHER): Payer: Medicare Other | Admitting: Family Medicine

## 2015-08-12 ENCOUNTER — Ambulatory Visit (HOSPITAL_COMMUNITY)
Admission: RE | Admit: 2015-08-12 | Discharge: 2015-08-12 | Disposition: A | Discharge status: Home / Self Care | Payer: Medicare Other | Source: Ambulatory Visit | Attending: Family Medicine | Admitting: Family Medicine

## 2015-08-12 ENCOUNTER — Encounter: Payer: Self-pay | Admitting: Family Medicine

## 2015-08-12 VITALS — BP 148/100 | HR 88 | Temp 98.0°F | Resp 16 | Ht 72.0 in | Wt 172.0 lb

## 2015-08-12 DIAGNOSIS — F329 Major depressive disorder, single episode, unspecified: Secondary | ICD-10-CM | POA: Diagnosis not present

## 2015-08-12 DIAGNOSIS — R059 Cough, unspecified: Secondary | ICD-10-CM

## 2015-08-12 DIAGNOSIS — R0989 Other specified symptoms and signs involving the circulatory and respiratory systems: Secondary | ICD-10-CM | POA: Diagnosis not present

## 2015-08-12 DIAGNOSIS — R55 Syncope and collapse: Secondary | ICD-10-CM

## 2015-08-12 DIAGNOSIS — R05 Cough: Secondary | ICD-10-CM | POA: Insufficient documentation

## 2015-08-12 DIAGNOSIS — L0591 Pilonidal cyst without abscess: Secondary | ICD-10-CM

## 2015-08-12 DIAGNOSIS — F32A Depression, unspecified: Secondary | ICD-10-CM | POA: Insufficient documentation

## 2015-08-12 DIAGNOSIS — Z125 Encounter for screening for malignant neoplasm of prostate: Secondary | ICD-10-CM

## 2015-08-12 DIAGNOSIS — Z1159 Encounter for screening for other viral diseases: Secondary | ICD-10-CM

## 2015-08-12 DIAGNOSIS — Z09 Encounter for follow-up examination after completed treatment for conditions other than malignant neoplasm: Secondary | ICD-10-CM

## 2015-08-12 DIAGNOSIS — I10 Essential (primary) hypertension: Secondary | ICD-10-CM

## 2015-08-12 MED ORDER — AMLODIPINE BESYLATE 2.5 MG PO TABS: 2.5000 mg | ORAL_TABLET | Freq: Every day | ORAL | Status: DC

## 2015-08-12 MED ORDER — FLUOXETINE HCL 10 MG PO CAPS: 10.0000 mg | ORAL_CAPSULE | Freq: Every day | ORAL | Status: DC

## 2015-08-12 MED ORDER — FLUOXETINE HCL (PMDD) 10 MG PO TABS: ORAL_TABLET | ORAL | Status: DC

## 2015-08-12 MED ORDER — PREDNISONE 5 MG PO TABS: 5.0000 mg | ORAL_TABLET | Freq: Two times a day (BID) | ORAL | Status: AC

## 2015-08-12 NOTE — Patient Instructions (Signed)
F/u in 6 weeks call if you need em before  NEW script for amlodipine 2.5 mg start this and continue lisinopril as before. Blood pressure is high  New is fluoxetine daily, to help with mood and energy  For x cough, CXR today and short course of prednisone  NEED TO STOP using potentially lethal street drugs as we discussed

## 2015-08-12 NOTE — Progress Notes (Signed)
   Subjective:    Patient ID: Marc Ward, male    DOB: 16-Sep-1950, 65 y.o.   MRN: [id number]  HPI Hospitalized from 1/10 to 07/31/2015, due to syncope. At time of admission , he was hypotensive and BOTH  Narcotic pain medication, which he states he has no knowledge of taking, and cocaine , which he continues to use regularly were found in urine tox screen. 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 States that cocaine gives him energy and drive and relieves his boredom   Review of Systems See HPI Denies recent fever or chills. Denies sinus pressure, nasal congestion, ear pain or sore throat. Denies chest congestion, productive cough or wheezing. Denies chest pains, palpitations and leg swelling Denies abdominal pain, nausea, vomiting,diarrhea or constipation.   Denies dysuria, frequency, hesitancy or incontinence. Denies joint pain, swelling and limitation in mobility. Denies headaches, seizures, numbness, or tingling. . Denies skin break down or rash.        Objective:   Physical Exam BP 148/100 mmHg  Pulse 88  Temp(Src) 98 F (36.7 C) (Oral)  Resp 16  Ht 6' (1.829 m)  Wt 172 lb (78.019 kg)  BMI 23.32 kg/m2  SpO2 98% Patient alert and oriented and in no cardiopulmonary distress.  HEENT: No facial asymmetry, EOMI,   oropharynx pink and moist.  Neck supple no JVD, no mass.  Chest: Clear to auscultation bilaterally.  CVS: S1, S2 no murmurs, no S3.Regular rate.  ABD: Soft non tender.   Ext: No edema  MS: Adequate ROM spine, shoulders, hips and knees.  Skin: Intact, no ulcerations or rash noted.  Psych: Good eye contact, normal affect. Memory impairedt not anxious or depressed appearing.  CNS: CN 2-12 intact, power,  normal throughout.no focal deficits noted.        Assessment & Plan:  Essential hypertension Uncontrolled, low dose amlodipine re started, re eval in 5 weeks  Syncope and  collapse Associated with hydrocodone, which is not prescribed to patient , and of which he states he has no knowledge using , and cocaine. Potential lethality of this behavior is openly discussed, verbalizes understanding, but still appears to lack insight as to the severity of the problem Refuses rehab , which has been recommended several times in the past  Infected pilonidal cyst Completely treated , pt asymptomatic   Depression Not suicidal or homicidal, no hallucinations, start fluoxetine,advised again of the need ti stop self medicationg with street drugs and potential danger involved in the behavior  Hospital discharge follow-up Hospitalized  From 1/10 to 07/31/2015 due to syncope , related to illicit drug use. Record reviewed at visit and pt counseled about the danger of continuing this behavior including death. No interest in rehab program, in denial, still does not see his need

## 2015-08-14 ENCOUNTER — Ambulatory Visit: Payer: Medicare Other | Admitting: Family Medicine

## 2015-08-18 DIAGNOSIS — Z09 Encounter for follow-up examination after completed treatment for conditions other than malignant neoplasm: Secondary | ICD-10-CM | POA: Insufficient documentation

## 2015-08-18 NOTE — Assessment & Plan Note (Signed)
Associated with hydrocodone, which is not prescribed to patient , and of which he states he has no knowledge using , and cocaine. Potential lethality of this behavior is openly discussed, verbalizes understanding, but still appears to lack insight as to the severity of the problem Refuses rehab , which has been recommended several times in the past

## 2015-08-18 NOTE — Assessment & Plan Note (Signed)
Uncontrolled, low dose amlodipine re started, re eval in 5 weeks

## 2015-08-18 NOTE — Assessment & Plan Note (Signed)
Based cough, prednisone short course prescribed

## 2015-08-18 NOTE — Assessment & Plan Note (Signed)
Completely treated , pt asymptomatic

## 2015-08-18 NOTE — Assessment & Plan Note (Signed)
Not suicidal or homicidal, no hallucinations, start fluoxetine,advised again of the need ti stop self medicationg with street drugs and potential danger involved in the behavior

## 2015-08-18 NOTE — Assessment & Plan Note (Signed)
Hospitalized  From 1/10 to 07/31/2015 due to syncope , related to illicit drug use. Record reviewed at visit and pt counseled about the danger of continuing this behavior including death. No interest in rehab program, in denial, still does not see his need

## 2015-08-29 ENCOUNTER — Telehealth: Payer: Self-pay | Admitting: Family Medicine

## 2015-08-29 NOTE — Telephone Encounter (Signed)
Please advise if you would like to change to maybe buspar?

## 2015-08-29 NOTE — Telephone Encounter (Signed)
I recommend discontinuing the med, OK to stop , no need to taper at such a low dose. Make a concerted effort to become more engaged in activities in the community and daily exercise. No replacement med suggested

## 2015-08-29 NOTE — Telephone Encounter (Signed)
Patient states that the medication FLUoxetine (PROZAC) 10 MG capsule is not agreeing with him at all, he feels tired all the time and short of breath, please advise?

## 2015-08-31 ENCOUNTER — Other Ambulatory Visit: Payer: Self-pay | Admitting: Family Medicine

## 2015-09-02 ENCOUNTER — Telehealth: Payer: Self-pay | Admitting: Family Medicine

## 2015-09-02 ENCOUNTER — Other Ambulatory Visit: Payer: Self-pay

## 2015-09-02 NOTE — Telephone Encounter (Signed)
Patient is asking for a refill on prednisone 5 mg , please advise?

## 2015-09-02 NOTE — Telephone Encounter (Signed)
Patient aware.

## 2015-09-03 NOTE — Telephone Encounter (Signed)
Patient aware that prednisone cannot be refilled at this time.

## 2015-09-12 ENCOUNTER — Ambulatory Visit: Payer: Medicare Other

## 2015-09-12 ENCOUNTER — Ambulatory Visit (HOSPITAL_COMMUNITY)
Admission: RE | Admit: 2015-09-12 | Discharge: 2015-09-12 | Disposition: A | Discharge status: Home / Self Care | Payer: Medicare Other | Source: Ambulatory Visit | Attending: Family Medicine | Admitting: Family Medicine

## 2015-09-12 ENCOUNTER — Other Ambulatory Visit: Payer: Self-pay

## 2015-09-12 ENCOUNTER — Telehealth: Payer: Self-pay

## 2015-09-12 VITALS — BP 150/92 | HR 82

## 2015-09-12 DIAGNOSIS — R0602 Shortness of breath: Secondary | ICD-10-CM

## 2015-09-12 MED ORDER — PREDNISONE 5 MG PO TABS: 5.0000 mg | ORAL_TABLET | Freq: Two times a day (BID) | ORAL | Status: DC

## 2015-09-12 NOTE — Telephone Encounter (Signed)
Pt in for nurse visit

## 2015-09-12 NOTE — Addendum Note (Signed)
Addended by: Eual Fines on: 09/12/2015 03:31 PM   Modules accepted: Orders

## 2015-09-12 NOTE — Progress Notes (Signed)
c/o SOB for 2 days and wheeze. Per dr prednisone sent in and chest xray to be done and pt to go to ER if he worsens

## 2015-09-12 NOTE — Telephone Encounter (Signed)
Pt walked in office stating he was a walk in and sit down. I asked him what his symptoms were and he said SOB x 2 days and chest congestion that he is unable to cough up. No fever but breathing was so bad he had to sleep propped up on pillows lastnight. Please advise if you want to see him.

## 2015-09-12 NOTE — Telephone Encounter (Signed)
Pls get basic vitals, INCLUDING pulse ox and blood pressure, document. If they are OK then needs CXR , and I am sending pred for 5 days  LET HIM kNOW if the prednisone does not help or if he h continues to feel as sick as he does now needs to go to the ED  For ecvaluation and treatment . May sched appt to be seen her next week if still symptomatic

## 2015-09-13 ENCOUNTER — Other Ambulatory Visit: Payer: Self-pay

## 2015-09-13 DIAGNOSIS — I10 Essential (primary) hypertension: Secondary | ICD-10-CM

## 2015-09-13 MED ORDER — AMLODIPINE BESYLATE 2.5 MG PO TABS: 2.5000 mg | ORAL_TABLET | Freq: Every day | ORAL | Status: DC

## 2015-09-20 DIAGNOSIS — I10 Essential (primary) hypertension: Secondary | ICD-10-CM | POA: Diagnosis not present

## 2015-09-20 DIAGNOSIS — Z1159 Encounter for screening for other viral diseases: Secondary | ICD-10-CM | POA: Diagnosis not present

## 2015-09-20 DIAGNOSIS — Z125 Encounter for screening for malignant neoplasm of prostate: Secondary | ICD-10-CM | POA: Diagnosis not present

## 2015-09-20 LAB — CBC WITH DIFFERENTIAL/PLATELET
BASOS ABS: 0.1 10*3/uL (ref 0.0–0.1)
Basophils Relative: 1 % (ref 0–1)
EOS ABS: 0.2 10*3/uL (ref 0.0–0.7)
EOS PCT: 3 % (ref 0–5)
HEMATOCRIT: 36.4 % — AB (ref 39.0–52.0)
Hemoglobin: 11.8 g/dL — ABNORMAL LOW (ref 13.0–17.0)
Lymphocytes Relative: 32 % (ref 12–46)
Lymphs Abs: 2.1 10*3/uL (ref 0.7–4.0)
MCH: 31.8 pg (ref 26.0–34.0)
MCHC: 32.4 g/dL (ref 30.0–36.0)
MCV: 98.1 fL (ref 78.0–100.0)
MONO ABS: 0.5 10*3/uL (ref 0.1–1.0)
MPV: 10 fL (ref 8.6–12.4)
Monocytes Relative: 7 % (ref 3–12)
Neutro Abs: 3.7 10*3/uL (ref 1.7–7.7)
Neutrophils Relative %: 57 % (ref 43–77)
Platelets: 170 10*3/uL (ref 150–400)
RBC: 3.71 MIL/uL — AB (ref 4.22–5.81)
RDW: 14.5 % (ref 11.5–15.5)
WBC: 6.5 10*3/uL (ref 4.0–10.5)

## 2015-09-21 LAB — BASIC METABOLIC PANEL
BUN: 14 mg/dL (ref 7–25)
CO2: 27 mmol/L (ref 20–31)
Calcium: 8.8 mg/dL (ref 8.6–10.3)
Chloride: 107 mmol/L (ref 98–110)
Creat: 1.1 mg/dL (ref 0.70–1.25)
GLUCOSE: 94 mg/dL (ref 65–99)
POTASSIUM: 3.9 mmol/L (ref 3.5–5.3)
SODIUM: 146 mmol/L (ref 135–146)

## 2015-09-21 LAB — HEPATITIS C ANTIBODY: HCV Ab: NEGATIVE

## 2015-09-21 LAB — PSA, MEDICARE: PSA: 1.01 ng/mL (ref <=4.00)

## 2015-09-24 ENCOUNTER — Encounter: Payer: Self-pay | Admitting: Family Medicine

## 2015-09-24 ENCOUNTER — Ambulatory Visit (INDEPENDENT_AMBULATORY_CARE_PROVIDER_SITE_OTHER): Payer: Medicare Other | Admitting: Family Medicine

## 2015-09-24 VITALS — BP 130/88 | HR 82 | Resp 18 | Ht 72.0 in | Wt 175.0 lb

## 2015-09-24 DIAGNOSIS — I10 Essential (primary) hypertension: Secondary | ICD-10-CM

## 2015-09-24 DIAGNOSIS — F32A Depression, unspecified: Secondary | ICD-10-CM

## 2015-09-24 DIAGNOSIS — F329 Major depressive disorder, single episode, unspecified: Secondary | ICD-10-CM

## 2015-09-24 DIAGNOSIS — D126 Benign neoplasm of colon, unspecified: Secondary | ICD-10-CM | POA: Diagnosis not present

## 2015-09-24 DIAGNOSIS — F1410 Cocaine abuse, uncomplicated: Secondary | ICD-10-CM | POA: Diagnosis not present

## 2015-09-24 DIAGNOSIS — Z723 Lack of physical exercise: Secondary | ICD-10-CM

## 2015-09-24 DIAGNOSIS — F1490 Cocaine use, unspecified, uncomplicated: Secondary | ICD-10-CM

## 2015-09-24 NOTE — Assessment & Plan Note (Signed)
Ongoing use, poor insight as to the negative effect on his health, will not commit to rehab program, no intent voiced to quit , despite already manifesting both renal and pulmonary complications

## 2015-09-24 NOTE — Assessment & Plan Note (Signed)
Untreated, not suicidal or homicidal, reported adverse s/e of SSRI, remains de motivated and continues to self medicate

## 2015-09-24 NOTE — Assessment & Plan Note (Signed)
Unchanged, re educated re benefit of daily physical activity for health and well being

## 2015-09-24 NOTE — Assessment & Plan Note (Signed)
Improved though still not at goal DASH diet and commitment to daily physical activity for a minimum of 30 minutes discussed and encouraged, as a part of hypertension management. The importance of attaining a healthy weight is also discussed.  BP/Weight 09/24/2015 09/12/2015 08/12/2015 07/31/2015 07/30/2015 07/29/2015 [id number]  Systolic BP [id number] Q000111Q 123456 [id number] - 123456 123456  Diastolic BP 88 92 123XX123 93 - 76 76  Wt. (Lbs) 175 - 172 - 175 175 173.4  BMI 23.73 - 23.32 - 23.73 23.73 23.51   No med change , will rely on improved lifestyle

## 2015-09-24 NOTE — Assessment & Plan Note (Signed)
rept colonoscopy due 06/2016. Pt should have had GI f/u in  01/2015. Currently has no GI  Complaints , referred to GI per their recommendation

## 2015-09-24 NOTE — Progress Notes (Signed)
   Subjective:    Patient ID: Marc Ward, male    DOB: [birth year], 65 y.o.   MRN: [id number]  HPI   Marc Ward     MRN: [id number]      DOB: [birth year]   HPI Marc Ward is here for follow up and re-evaluation of chronic medical conditions, medication management and review of any available recent lab and radiology data.  Preventive health is updated, specifically  Cancer screening and Immunization.   Questions or concerns regarding consultations or procedures which the PT has had in the interim are  addressed. The PT c/o intolerance to SSRI, and has no interest in trial of another medication for depreession. He is not suicidal or homicidal. There are no new concerns.  There are no specific complaints   ROS Denies recent fever or chills. Denies sinus pressure, nasal congestion, ear pain or sore throat. Denies chest congestion, productive cough or wheezing. Denies chest pains, palpitations and leg swelling Denies abdominal pain, nausea, vomiting,diarrhea or constipation.   Denies dysuria, frequency, hesitancy or incontinence. Denies joint pain, swelling and limitation in mobility. Denies headaches, seizures, numbness, or tingling. Denies uncontrolled epression, anxiety or insomnia. Denies skin break down or rash.   PE  BP 130/88 mmHg  Pulse 82  Resp 18  Ht 6' (1.829 m)  Wt 175 lb (79.379 kg)  BMI 23.73 kg/m2  SpO2 99%  Patient alert and oriented and in no cardiopulmonary distress.  HEENT: No facial asymmetry, EOMI,   oropharynx pink and moist.  Neck supple no JVD, no mass.  Chest: Clear to auscultation bilaterally.  CVS: S1, S2 no murmurs, no S3.Regular rate.  ABD: Soft non tender.   Ext: No edema  MS: Adequate ROM spine, shoulders, hips and knees.  Skin: Intact, no ulcerations or rash noted.  Psych: Good eye contact, normal affect. Memory intact not anxious or depressed appearing.  CNS: CN 2-12 intact, power,  normal throughout.no focal deficits  noted.   Assessment & Plan   Essential hypertension Improved though still not at goal DASH diet and commitment to daily physical activity for a minimum of 30 minutes discussed and encouraged, as a part of hypertension management. The importance of attaining a healthy weight is also discussed.  BP/Weight 09/24/2015 09/12/2015 08/12/2015 07/31/2015 07/30/2015 07/29/2015 [id number]  Systolic BP [id number] Q000111Q 123456 [id number] - 123456 123456  Diastolic BP 88 92 123XX123 93 - 76 76  Wt. (Lbs) 175 - 172 - 175 175 173.4  BMI 23.73 - 23.32 - 23.73 23.73 23.51   No med change , will rely on improved lifestyle    TUBULOVILLOUS ADENOMA, COLON rept colonoscopy due 06/2016. Pt should have had GI f/u in  01/2015. Currently has no GI  Complaints , referred to GI per their recommendation  Cocaine use Ongoing use, poor insight as to the negative effect on his health, will not commit to rehab program, no intent voiced to quit , despite already manifesting both renal and pulmonary complications  Lack of physical exercise Unchanged, re educated re benefit of daily physical activity for health and well being  Depression Untreated, not suicidal or homicidal, reported adverse s/e of SSRI, remains de motivated and continues to self medicate       Review of Systems     Objective:   Physical Exam        Assessment & Plan:

## 2015-09-24 NOTE — Patient Instructions (Signed)
Annuial wellness in 4 month, call if you need me before  Please commit to daily exercise and reduce salt in diet , blood pressure still slightly elvated.  Labs look good  Dr Oneida Alar office will call you for follow up, this is past due  Thanks for choosing [facility], we consider it a privelige to serve you.

## 2015-09-25 ENCOUNTER — Encounter: Payer: Self-pay | Admitting: Gastroenterology

## 2015-10-15 ENCOUNTER — Encounter: Payer: Self-pay | Admitting: Family Medicine

## 2015-10-15 ENCOUNTER — Ambulatory Visit (INDEPENDENT_AMBULATORY_CARE_PROVIDER_SITE_OTHER): Payer: Medicare Other | Admitting: Family Medicine

## 2015-10-15 VITALS — BP 160/90 | HR 82 | Temp 98.2°F | Resp 18 | Ht 72.0 in | Wt 171.0 lb

## 2015-10-15 DIAGNOSIS — R05 Cough: Secondary | ICD-10-CM | POA: Diagnosis not present

## 2015-10-15 DIAGNOSIS — F32A Depression, unspecified: Secondary | ICD-10-CM

## 2015-10-15 DIAGNOSIS — K219 Gastro-esophageal reflux disease without esophagitis: Secondary | ICD-10-CM

## 2015-10-15 DIAGNOSIS — I10 Essential (primary) hypertension: Secondary | ICD-10-CM

## 2015-10-15 DIAGNOSIS — F329 Major depressive disorder, single episode, unspecified: Secondary | ICD-10-CM

## 2015-10-15 DIAGNOSIS — R059 Cough, unspecified: Secondary | ICD-10-CM

## 2015-10-15 MED ORDER — PREDNISONE 5 MG PO TABS: 5.0000 mg | ORAL_TABLET | Freq: Two times a day (BID) | ORAL | Status: DC

## 2015-10-15 MED ORDER — BENZONATATE 100 MG PO CAPS: 100.0000 mg | ORAL_CAPSULE | Freq: Two times a day (BID) | ORAL | Status: DC | PRN

## 2015-10-15 MED ORDER — AMLODIPINE BESYLATE 5 MG PO TABS: 5.0000 mg | ORAL_TABLET | Freq: Every day | ORAL | Status: DC

## 2015-10-15 NOTE — Progress Notes (Signed)
   Subjective:    Patient ID: Marc Ward, male    DOB: Dec 05, 1950, 65 y.o.   MRN: [id number]  HPI 4 day h/o cough, wheeze and chest congestion. Denies fever or chills, no sputum or sinus pressure, nasal congestion or ear pain Recurrent problem, ongoing cocaine use though he minimizes the extent, past h/o marijuana use and asbestos exposure Will refer for PFT and pulmonary eval Again counseled re real need to d/c cocaine use as i do believe this is the main aggravant  Review of Systems See HPI  Denies chest pains, palpitations and leg swelling Denies abdominal pain, nausea, vomiting,diarrhea or constipation.   Denies dysuria, frequency, hesitancy or incontinence. Denies joint pain, swelling and limitation in mobility. Denies headaches, seizures, numbness, or tingling. Denies depression, anxiety or insomnia. Denies skin break down or rash.        Objective:   Physical Exam BP 160/90 mmHg  Pulse 82  Temp(Src) 98.2 F (36.8 C)  Resp 18  Ht 6' (1.829 m)  Wt 171 lb (77.565 kg)  BMI 23.19 kg/m2  SpO2 96% Patient alert and oriented and in no cardiopulmonary distress.  HEENT: No facial asymmetry, EOMI,   oropharynx pink and moist.  Neck supple no JVD, no mass.TM clear bilaterally, no sinus tenderness  Chest: adequate air entry, few scattered crackles, no wheezes  CVS: S1, S2 no murmurs, no S3.Regular rate.  ABD: Soft non tender.   Ext: No edema  MS: Adequate ROM spine, shoulders, hips and knees.  Skin: Intact, no ulcerations or rash noted.  Psych: Good eye contact, normal affect. Memory intact not anxious or depressed appearing.  CNS: CN 2-12 intact, power,  normal throughout.no focal deficits noted.        Assessment & Plan:  Essential hypertension Uncontrolled, increase norvasc dose DASH diet and commitment to daily physical activity for a minimum of 30 minutes discussed and encouraged, as a part of hypertension management. The importance of attaining a  healthy weight is also discussed.  BP/Weight 10/15/2015 09/24/2015 09/12/2015 08/12/2015 07/31/2015 123456 Q000111Q  Systolic BP 0000000 [id number] Q000111Q 123456 [id number] - 123456  Diastolic BP 90 88 92 123XX123 93 - 76  Wt. 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 Explained the ;likelihood that cough s likeley from ongong cocaine use, also states has smoked marijuana in the past as well  Depression Denies symptoms of depression and d/c SSRI prescribed within less han 1 week, stating "made him feel strange"  GERD (gastroesophageal reflux disease) Controlled, no change in medication

## 2015-10-15 NOTE — Assessment & Plan Note (Signed)
Denies symptoms of depression and d/c SSRI prescribed within less han 1 week, stating "made him feel strange"

## 2015-10-15 NOTE — Assessment & Plan Note (Addendum)
Uncontrolled, increase norvasc dose DASH diet and commitment to daily physical activity for a minimum of 30 minutes discussed and encouraged, as a part of hypertension management. The importance of attaining a healthy weight is also discussed.  BP/Weight 10/15/2015 09/24/2015 09/12/2015 08/12/2015 07/31/2015 123456 Q000111Q  Systolic BP 0000000 [id number] Q000111Q 123456 [id number] - 123456  Diastolic BP 90 88 92 123XX123 93 - 76  Wt. (Lbs) 171 175 - 172 - 175 175  BMI 23.19 23.73 - 23.32 - 23.73 23.73

## 2015-10-15 NOTE — Patient Instructions (Addendum)
F/u in 6 weeks, call if you need me sooner  You need to take higher dose of amlodipine 5 mg one daily, fill today pls, blood pressure is high You are referred for lung function test at hospital and to see Dr Luan Pulling for chronic cough evaluation  Tessalon perles and prednisone dose pack are prescribed today  Thanks for choosing [facility], we consider it a privelige to serve you.

## 2015-10-15 NOTE — Assessment & Plan Note (Signed)
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 Explained the ;likelihood that cough s likeley from ongong cocaine use, also states has smoked marijuana in the past as well

## 2015-10-15 NOTE — Assessment & Plan Note (Signed)
Controlled, no change in medication  

## 2015-10-29 ENCOUNTER — Telehealth: Payer: Self-pay

## 2015-10-29 MED ORDER — PREDNISONE 5 MG PO TABS: 5.0000 mg | ORAL_TABLET | Freq: Two times a day (BID) | ORAL | Status: DC

## 2015-10-29 NOTE — Telephone Encounter (Signed)
Patient aware and medication sent.

## 2015-10-29 NOTE — Addendum Note (Signed)
Addended by: Denman George B on: 10/29/2015 02:19 PM   Modules accepted: Orders

## 2015-10-29 NOTE — Telephone Encounter (Signed)
Advise hold on prednisone till AFTER test Pls send to start 10/31/2015 pred 5 mg twice daily x 5 days Let himnow not healthy to keep taking prednisone so often, keep appt with Dr Luan Pulling

## 2015-10-30 ENCOUNTER — Encounter: Payer: Self-pay | Admitting: Gastroenterology

## 2015-10-30 ENCOUNTER — Encounter: Payer: Medicare Other | Admitting: Family Medicine

## 2015-10-30 ENCOUNTER — Ambulatory Visit (HOSPITAL_COMMUNITY)
Admission: RE | Admit: 2015-10-30 | Discharge: 2015-10-30 | Disposition: A | Discharge status: Home / Self Care | Payer: Medicare Other | Source: Ambulatory Visit | Attending: Family Medicine | Admitting: Family Medicine

## 2015-10-30 ENCOUNTER — Ambulatory Visit (INDEPENDENT_AMBULATORY_CARE_PROVIDER_SITE_OTHER): Payer: Medicare Other | Admitting: Gastroenterology

## 2015-10-30 ENCOUNTER — Other Ambulatory Visit: Payer: Self-pay

## 2015-10-30 VITALS — BP 134/90 | HR 75 | Temp 97.1°F | Ht 72.0 in | Wt 171.0 lb

## 2015-10-30 DIAGNOSIS — R059 Cough, unspecified: Secondary | ICD-10-CM

## 2015-10-30 DIAGNOSIS — D125 Benign neoplasm of sigmoid colon: Secondary | ICD-10-CM

## 2015-10-30 DIAGNOSIS — D649 Anemia, unspecified: Secondary | ICD-10-CM

## 2015-10-30 DIAGNOSIS — R05 Cough: Secondary | ICD-10-CM | POA: Diagnosis not present

## 2015-10-30 LAB — PULMONARY FUNCTION TEST
DL/VA % PRED: 71 %
DL/VA: 3.36 ml/min/mmHg/L
DLCO UNC % PRED: 39 %
DLCO unc: 13.82 ml/min/mmHg
FEF 25-75 POST: 1.41 L/s
FEF 25-75 PRE: 0.92 L/s
FEF2575-%Change-Post: 53 %
FEF2575-%PRED-POST: 49 %
FEF2575-%PRED-PRE: 32 %
FEV1-%Change-Post: 13 %
FEV1-%Pred-Post: 59 %
FEV1-%Pred-Pre: 52 %
FEV1-POST: 1.91 L
FEV1-Pre: 1.69 L
FEV1FVC-%Change-Post: 4 %
FEV1FVC-%PRED-PRE: 84 %
FEV6-%CHANGE-POST: 8 %
FEV6-%PRED-POST: 69 %
FEV6-%Pred-Pre: 64 %
FEV6-Post: 2.82 L
FEV6-Pre: 2.59 L
FEV6FVC-%Change-Post: 0 %
FEV6FVC-%Pred-Post: 104 %
FEV6FVC-%Pred-Pre: 103 %
FVC-%Change-Post: 8 %
FVC-%PRED-POST: 67 %
FVC-%Pred-Pre: 61 %
FVC-Post: 2.82 L
FVC-Pre: 2.6 L
POST FEV1/FVC RATIO: 68 %
Post FEV6/FVC ratio: 100 %
Pre FEV1/FVC ratio: 65 %
Pre FEV6/FVC Ratio: 100 %
RV % pred: 147 %
RV: 3.68 L
TLC % pred: 84 %
TLC: 6.27 L

## 2015-10-30 MED ORDER — ALBUTEROL SULFATE (2.5 MG/3ML) 0.083% IN NEBU
2.5000 mg | INHALATION_SOLUTION | Freq: Once | RESPIRATORY_TRACT | Status: AC
  Administered 2015-10-30: 2.5 mg via RESPIRATORY_TRACT

## 2015-10-30 NOTE — Patient Instructions (Addendum)
COMPLETE FERRITIN TO CHECK IRON STORES.  PLEASE CALL WITH QUESTIONS OR CONCERNS IF YOU HAVE RECTAL BLEEDING OR SEE BLACK TARRY STOOLS.  NEXT COLONOSCOPY IN DEC 2017. CALL TO SCHEDULE AND WE WILL TRIAGE YOU OVER THE PHONE.  OUTPATIENT VISIT IN ONE YEAR.

## 2015-10-30 NOTE — Progress Notes (Signed)
Subjective:    Patient ID: Marc Ward, male    DOB: 1950-08-30, 65 y.o.   MRN: [id number]  Tula Nakayama, MD  HPI Blood count's been ok. Hb been 10-11.8 since 2010. Bms: daily(NL). SOB: GOT BETTER THEN WORSE AFTER PREDNISONE. SMOKES A LITTLE. IN THE PAST SMOKED A LOT. WEIGHT DOWN SLIGHTLY 179 IN 2014. APPETITE: GREAT. ENERGY LEVEL: GREAT. NO BC/GOODY POWDERS, IBUPROFEN/MOTRIN, OR NAPROXEN/ALEVE. PT DENIES FEVER, CHILLS, HEMATOCHEZIA, nausea, vomiting, melena, diarrhea, CHEST PAIN, CHANGE IN BOWEL IN HABITS, constipation, abdominal pain, problems swallowing, OR heartburn or indigestion.   Past Medical History  Diagnosis Date  . Sickle cell trait ([place])   . Hypertension 2009  . H. pylori infection FEB 2011    ABO BID x1O DAYS  . IDA (iron deficiency anemia)   . Tubulovillous adenoma of colon 2013    currently under close surveillance by GI  . Substance use disorder     cocaine and marijuana   Past Surgical History  Procedure Laterality Date  . Tonsillectomy    . Adenoidectomy    . Circumcision    . Colonoscopy  AUG 2010    ADVANCED [place] ADENOMA, TICS,   . Upper gastrointestinal endoscopy  FEB 2011    H. PYLORI  . Laparotomy  11/08/2011    Procedure: EXPLORATORY LAPAROTOMY;  Surgeon: Jamesetta So, MD;  Location: [facility];  Service: General;  Laterality: N/A;  . Bowel resection  11/08/2011    Procedure: SMALL BOWEL RESECTION;  Surgeon: Jamesetta So, MD;  Location: [facility];  Service: General;  Laterality: N/A;    No Known Allergies  Current Outpatient Prescriptions  Medication Sig Dispense Refill  . acetaminophen (TYLENOL) 500 MG tablet Take 1,000 mg by mouth every 6 (six) hours as needed.    Marland Kitchen amLODipine (NORVASC) 5 MG tablet Take 1 tablet (5 mg total) by mouth daily.    Marland Kitchen aspirin 81 MG tablet Take 81 mg by mouth daily.    . benzonatate (TESSALON) 100 MG capsule Take 1 capsule (100 mg total) by mouth 2 (two) times daily as needed for cough.    . cholecalciferol (VITAMIN D)  1000 UNITS tablet Take 1,000 Units by mouth daily.    Marland Kitchen omeprazole (PRILOSEC) 20 MG capsule Take 1 capsule (20 mg total) by mouth daily.    . predniSONE (DELTASONE) 5 MG tablet Take 1 tablet (5 mg total) by mouth 2 (two) times daily with a meal.    . predniSONE (DELTASONE) 5 MG tablet Take 1 tablet (5 mg total) by mouth 2 (two) times daily with a meal. Start on 4/13     Review of Systems PER HPI OTHERWISE ALL SYSTEMS ARE NEGATIVE.    Objective:   Physical Exam  Constitutional: He is oriented to person, place, and time. He appears well-developed and well-nourished. No distress.  HENT:  Head: Normocephalic and atraumatic.  Mouth/Throat: Oropharynx is clear and moist. No oropharyngeal exudate.  Eyes: Pupils are equal, round, and reactive to light. No scleral icterus.  Neck: Normal range of motion. Neck supple.  Cardiovascular: Normal rate, regular rhythm and normal heart sounds.   Pulmonary/Chest: Effort normal and breath sounds normal. No respiratory distress.  Abdominal: Soft. Bowel sounds are normal. He exhibits no distension. There is no tenderness.  Musculoskeletal: He exhibits no edema.  Lymphadenopathy:    He has no cervical adenopathy.  Neurological: He is alert and oriented to person, place, and time.  NO FOCAL DEFICITS  Psychiatric: He has a normal mood  and affect.  Vitals reviewed.         Assessment & Plan:

## 2015-10-30 NOTE — Assessment & Plan Note (Addendum)
NO BRBPR OR MELENA. NO WARNING SIGNS/SYMPTOMS  LAST HB 2017 REVIEWED WITH PATIENT. COMPLETE FERRITIN TO CHECK IRON STORES. CALL WITH QUESTIONS OR CONCERNS IF RECTAL BLEEDING OR BLACK TARRY STOOLS OCCUR. NEXT COLONOSCOPY IN DEC 2017. CALL TO SCHEDULE AND WE WILL TRIAGE YOU OVER THE PHONE. FOLLOW UP IN 1 YEAR.    GREATER THAN 50% WAS SPENT IN COUNSELING & COORDINATION OF CARE WITH THE PATIENT: DISCUSSED DIFFERENTIAL DIAGNOSIS, PROCEDURE, BENEFITS, AND MANAGEMENT OF CHRONIC ANEMIA. TOTAL ENCOUNTER TIME: 15 MINS.

## 2015-10-30 NOTE — Progress Notes (Signed)
CC'ED TO PCP 

## 2015-10-30 NOTE — Assessment & Plan Note (Signed)
NEXT TCS IN DEC 2017

## 2015-10-30 NOTE — Progress Notes (Signed)
ON RECALL  °

## 2015-11-15 ENCOUNTER — Telehealth: Payer: Self-pay

## 2015-11-15 NOTE — Telephone Encounter (Signed)
Advised patient to seek care at the urgent care or the ED.

## 2015-11-21 ENCOUNTER — Telehealth: Payer: Self-pay

## 2015-11-21 DIAGNOSIS — D649 Anemia, unspecified: Secondary | ICD-10-CM | POA: Diagnosis not present

## 2015-11-21 MED ORDER — ALBUTEROL SULFATE HFA 108 (90 BASE) MCG/ACT IN AERS: 2.0000 | INHALATION_SPRAY | Freq: Four times a day (QID) | RESPIRATORY_TRACT | Status: DC | PRN

## 2015-11-21 MED ORDER — BUDESONIDE-FORMOTEROL FUMARATE 80-4.5 MCG/ACT IN AERO: 2.0000 | INHALATION_SPRAY | Freq: Two times a day (BID) | RESPIRATORY_TRACT | Status: DC

## 2015-11-21 NOTE — Telephone Encounter (Signed)
Based on pFT , I have sent symbicort, needs to use daily so does not have to rely on albuterol as much, that is also sent

## 2015-11-22 LAB — FERRITIN: Ferritin: 138 ng/mL (ref 20–380)

## 2015-11-22 NOTE — Telephone Encounter (Signed)
Called and left message for patient to return call.  

## 2015-11-25 ENCOUNTER — Encounter: Payer: Self-pay | Admitting: Family Medicine

## 2015-11-25 ENCOUNTER — Ambulatory Visit (INDEPENDENT_AMBULATORY_CARE_PROVIDER_SITE_OTHER): Payer: Medicare Other | Admitting: Family Medicine

## 2015-11-25 VITALS — BP 120/80 | HR 76 | Resp 18 | Ht 72.0 in | Wt 176.0 lb

## 2015-11-25 DIAGNOSIS — J449 Chronic obstructive pulmonary disease, unspecified: Secondary | ICD-10-CM | POA: Diagnosis not present

## 2015-11-25 DIAGNOSIS — K219 Gastro-esophageal reflux disease without esophagitis: Secondary | ICD-10-CM | POA: Diagnosis not present

## 2015-11-25 DIAGNOSIS — I10 Essential (primary) hypertension: Secondary | ICD-10-CM | POA: Diagnosis not present

## 2015-11-25 NOTE — Progress Notes (Signed)
   Subjective:    Patient ID: Marc Ward, male    DOB: March 23, 1951, 65 y.o.   MRN: [id number]  HPI   Marc Ward     MRN: [id number]      DOB: February 02, 1951   HPI Marc Ward is here for follow up and re-evaluation of chronic medical conditions, medication management and review of any available recent lab and radiology data.  Preventive health is updated, specifically  Cancer screening and Immunization.   Questions or concerns regarding consultations or procedures which the PT has had in the interim are  addressed. The PT denies any adverse reactions to current medications since the last visit.  There are no new concerns.  There are no specific complaints   ROS Denies recent fever or chills. Denies sinus pressure, nasal congestion, ear pain or sore throat. Denies chest congestion, productive cough or wheezing. Denies chest pains, palpitations and leg swelling Denies abdominal pain, nausea, vomiting,diarrhea or constipation.   Denies dysuria, frequency, hesitancy or incontinence. Denies joint pain, swelling and limitation in mobility. Denies headaches, seizures, numbness, or tingling. Denies depression, anxiety or insomnia. Denies skin break down or rash.   PE  BP 120/80 mmHg  Pulse 76  Resp 18  Ht 6' (1.829 m)  Wt 176 lb (79.833 kg)  BMI 23.86 kg/m2  SpO2 92%  Patient alert and oriented and in no cardiopulmonary distress.  HEENT: No facial asymmetry, EOMI,   oropharynx pink and moist.  Neck supple no JVD, no mass.  Chest: Clear to auscultation bilaterally.  CVS: S1, S2 no murmurs, no S3.Regular rate.  ABD: Soft non tender.   Ext: No edema  MS: Adequate ROM spine, shoulders, hips and knees.  Skin: Intact, no ulcerations or rash noted.  Psych: Good eye contact, normal affect. Memory intact not anxious or depressed appearing.  CNS: CN 2-12 intact, power,  normal throughout.no focal deficits noted.   Assessment & Plan   Essential hypertension Controlled, no change  in medication DASH diet and commitment to daily physical activity for a minimum of 30 minutes discussed and encouraged, as a part of hypertension management. The importance of attaining a healthy weight is also discussed.  BP/Weight 11/25/2015 10/30/2015 10/15/2015 09/24/2015 09/12/2015 08/12/2015 Q000111Q  Systolic BP 123456 Q000111Q 0000000 [id number] Q000111Q 123456 [id number]  Diastolic BP 80 90 90 88 92 100 93  Wt. 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

## 2015-11-25 NOTE — Patient Instructions (Addendum)
Annual physical exam in 4.5 month, call if you need me before  Blood pressure is excellent  Keep appt with Dr Luan Pulling, I am thankful cough and wheeze are better

## 2015-12-02 DIAGNOSIS — J45901 Unspecified asthma with (acute) exacerbation: Secondary | ICD-10-CM | POA: Diagnosis not present

## 2015-12-02 DIAGNOSIS — J449 Chronic obstructive pulmonary disease, unspecified: Secondary | ICD-10-CM | POA: Insufficient documentation

## 2015-12-02 DIAGNOSIS — I10 Essential (primary) hypertension: Secondary | ICD-10-CM | POA: Diagnosis not present

## 2015-12-02 NOTE — Assessment & Plan Note (Signed)
Controlled, no change in medication DASH diet and commitment to daily physical activity for a minimum of 30 minutes discussed and encouraged, as a part of hypertension management. The importance of attaining a healthy weight is also discussed.  BP/Weight 11/25/2015 10/30/2015 10/15/2015 09/24/2015 09/12/2015 08/12/2015 Q000111Q  Systolic BP 123456 [id number] [id number]  Diastolic BP 80 90 90 88 92 100 93  Wt. (Lbs) 176 171 171 175 - 172 -  BMI 23.86 23.19 23.19 23.73 - 23.32 -

## 2015-12-02 NOTE — Assessment & Plan Note (Signed)
Controlled, no change in medication  

## 2015-12-02 NOTE — Assessment & Plan Note (Signed)
Reports no cough or wheeze since test and though he called in for medication, he has only filled the proventil and has not used it as yet

## 2015-12-08 ENCOUNTER — Telehealth: Payer: Self-pay | Admitting: Gastroenterology

## 2015-12-08 NOTE — Telephone Encounter (Signed)
PLEASE CALL PT. HIS FERRITIN IS NORMAL.

## 2015-12-09 NOTE — Telephone Encounter (Signed)
Called pt and LMOM with results

## 2016-01-13 ENCOUNTER — Other Ambulatory Visit: Payer: Self-pay | Admitting: Family Medicine

## 2016-02-04 ENCOUNTER — Ambulatory Visit (INDEPENDENT_AMBULATORY_CARE_PROVIDER_SITE_OTHER): Payer: Medicare Other | Admitting: Family Medicine

## 2016-02-04 ENCOUNTER — Encounter: Payer: Self-pay | Admitting: Family Medicine

## 2016-02-04 VITALS — BP 122/80 | HR 76 | Resp 18 | Ht 72.0 in | Wt 176.0 lb

## 2016-02-04 DIAGNOSIS — Z0000 Encounter for general adult medical examination without abnormal findings: Secondary | ICD-10-CM | POA: Diagnosis not present

## 2016-02-04 DIAGNOSIS — I10 Essential (primary) hypertension: Secondary | ICD-10-CM

## 2016-02-04 NOTE — Progress Notes (Signed)
Preventive Screening-Counseling & Management   Patient present here today for a Medicare annual wellness visit.   Current Problems (verified)   Medications Prior to Visit Allergies (verified)   PAST HISTORY  Family History (updated)   Social History Divorced;disabled father of 3 (1 son and 2 daughters)     Risk Factors  Current exercise habits: Goes to the [employer] 3 days per week   Dietary issues discussed:  Heart healthy low fat diet encouraged;   Eats 2/3 of meals cooked at home    Cardiac risk factors:   Depression Screen  (Note: if answer to either of the following is "Yes", a more complete depression screening is indicated)   Over the past two weeks, have you felt down, depressed or hopeless? No  Over the past two weeks, have you felt little interest or pleasure in doing things? No  Have you lost interest or pleasure in daily life? No  Do you often feel hopeless? No  Do you cry easily over simple problems? No   Activities of Daily Living  In your present state of health, do you have any difficulty performing the following activities?  Driving?: No Managing money?: No Feeding yourself?:No Getting from bed to chair?:No Climbing a flight of stairs?:No Preparing food and eating?:No Bathing or showering?:No Getting dressed?:No Getting to the toilet?:No Using the toilet?:No Moving around from place to place?: No  Fall Risk Assessment In the past year have you fallen or had a near fall?: Yes x 1 Are you currently taking any medications that make you dizzy?:No   Hearing Difficulties: No Do you often ask people to speak up or repeat themselves?:No Do you experience ringing or noises in your ears?:No Do you have difficulty understanding soft or whispered voices?:No  Cognitive Testing  Alert? Yes Normal Appearance?Yes  Oriented to person? Yes Place? Yes  Time? Yes  Displays appropriate judgment?Yes  Can read the correct time from a watch face? yes Are you having  problems remembering things?No age appropriate   Advanced Directives have been discussed with the patient?Yes and brochure/forms provided    List the Names of Other Physician/Practitioners you currently use: careteams up to date    Indicate any recent Medical Services you may have received from other than [facility] providers in the past year (date may be approximate).     Medicare Attestation  I have personally reviewed:  The patient's medical and social history  Their use of alcohol, tobacco or illicit drugs  Their current medications and supplements  The patient's functional ability including ADLs,fall risks, home safety risks, cognitive, and hearing and visual impairment  Diet and physical activities  Evidence for depression or mood disorders  The patient's weight, height, BMI, and visual acuity have been recorded in the chart. I have made referrals, counseling, and provided education to the patient based on review of the above and I have provided the patient with a written personalized care plan for preventive services.    Physical Exam BP 122/80 mmHg  Pulse 76  Resp 18  Ht 6' (1.829 m)  Wt 176 lb (79.833 kg)  BMI 23.86 kg/m2  SpO2 98%   Assessment & Plan:  Medicare annual wellness visit, subsequent Annual exam as documented. Counseling done  re healthy lifestyle involving commitment to 150 minutes exercise per week, heart healthy diet, and attaining healthy weight.The importance of adequate sleep also discussed. Regular seat belt use and home safety, is also discussed. Changes in health habits are decided on by the  patient with goals and time frames  set for achieving them. Immunization and cancer screening needs are specifically addressed at this visit.

## 2016-02-04 NOTE — Patient Instructions (Addendum)
Annual physical exam in 3.5 month, call if you need me sooner  HAPPY BIRTHDAY THIS Friday!!!, STAY ABOVE GROUND!!!!  Fasting l;ipid and chem 7 in 3.5 month and CBC  No change in meds  Commit to exercise 5 days per week for 30 minutes each day please Thank you  for choosing [facility]. We consider it a privelige to serve you.  Delivering excellent health care in a caring and  compassionate way is our goal.  Partnering with you,  so that together we can achieve this goal is our strategy.

## 2016-02-04 NOTE — Assessment & Plan Note (Signed)

## 2016-04-02 DIAGNOSIS — I10 Essential (primary) hypertension: Secondary | ICD-10-CM | POA: Diagnosis not present

## 2016-04-02 DIAGNOSIS — R05 Cough: Secondary | ICD-10-CM | POA: Diagnosis not present

## 2016-05-26 ENCOUNTER — Encounter: Payer: Self-pay | Admitting: Gastroenterology

## 2016-06-17 ENCOUNTER — Ambulatory Visit (INDEPENDENT_AMBULATORY_CARE_PROVIDER_SITE_OTHER): Payer: Medicare Other | Admitting: Family Medicine

## 2016-06-17 ENCOUNTER — Encounter: Payer: Self-pay | Admitting: Family Medicine

## 2016-06-17 VITALS — BP 128/76 | HR 76 | Temp 98.1°F | Resp 16 | Ht 72.0 in | Wt 181.1 lb

## 2016-06-17 DIAGNOSIS — Z0000 Encounter for general adult medical examination without abnormal findings: Secondary | ICD-10-CM

## 2016-06-17 DIAGNOSIS — D573 Sickle-cell trait: Secondary | ICD-10-CM

## 2016-06-17 DIAGNOSIS — Z23 Encounter for immunization: Secondary | ICD-10-CM | POA: Diagnosis not present

## 2016-06-17 DIAGNOSIS — Z1211 Encounter for screening for malignant neoplasm of colon: Secondary | ICD-10-CM | POA: Diagnosis not present

## 2016-06-17 DIAGNOSIS — R195 Other fecal abnormalities: Secondary | ICD-10-CM

## 2016-06-17 DIAGNOSIS — D649 Anemia, unspecified: Secondary | ICD-10-CM

## 2016-06-17 DIAGNOSIS — I10 Essential (primary) hypertension: Secondary | ICD-10-CM

## 2016-06-17 LAB — HEMOCCULT GUIAC POC 1CARD (OFFICE): Fecal Occult Blood, POC: POSITIVE — AB

## 2016-06-17 NOTE — Assessment & Plan Note (Signed)
After obtaining informed consent, the vaccine is  administered by LPN.  

## 2016-06-17 NOTE — Assessment & Plan Note (Signed)

## 2016-06-17 NOTE — Progress Notes (Signed)
   Marc Ward     MRN: [id number]      DOB: 05-29-1951   HPI: Patient is in for annual physical exam. No other health concerns are expressed or addressed at the visit. Recent labs, if available are reviewed. Immunization is reviewed , and  updated if needed.    PE; Pleasant male, alert and oriented x 3, in no cardio-pulmonary distress. Afebrile. HEENT No facial trauma or asymetry. Sinuses non tender. EOMI, pupils equally reactive to light. External ears normal, tympanic membranes clear. Oropharynx moist, no exudate. Neck: supple, no adenopathy,JVD or thyromegaly.No bruits.  Chest: Clear to ascultation bilaterally.No crackles or wheezes. Non tender to palpation  Breast: No asymetry,no masses. No nipple discharge or inversion. No axillary or supraclavicular adenopathy  Cardiovascular system; Heart sounds normal,  S1 and  S2 ,no S3.  No murmur, or thrill. Apical beat not displaced Peripheral pulses normal.  Abdomen: Soft, non tender, no organomegaly or masses. No bruits. Bowel sounds normal. No guarding, tenderness or rebound.  Rectal:  Normal sphincter tone. No hemorrhoids or  masses. guaiac negative stool. Prostate smooth and firm    Musculoskeletal exam: Full ROM of spine, hips , shoulders and knees. No deformity ,swelling or crepitus noted. No muscle wasting or atrophy.   Neurologic: Cranial nerves 2 to 12 intact. Power, tone ,sensation and reflexes normal throughout. No disturbance in gait. No tremor.  Skin: Intact, no ulceration, erythema , scaling or rash noted. Pigmentation normal throughout  Psych; Normal mood and affect. Judgement and concentration normal   Assessment & Plan:  Annual physical exam Annual exam as documented. Counseling done  re healthy lifestyle involving commitment to 150 minutes exercise per week, heart healthy diet, and attaining healthy weight.The importance of adequate sleep also discussed. Regular seat belt use and home  safety, is also discussed. Changes in health habits are decided on by the patient with goals and time frames  set for achieving them. Immunization and cancer screening needs are specifically addressed at this visit.   Need for prophylactic vaccination and inoculation against influenza After obtaining informed consent, the vaccine is  administered by LPN.   Heme positive stool GI appt   In next 1 to 2 weeks and repeat colonoscopy in near future

## 2016-06-17 NOTE — Assessment & Plan Note (Signed)
GI appt   In next 1 to 2 weeks and repeat colonoscopy in near future

## 2016-06-17 NOTE — Patient Instructions (Addendum)
F/u in 5 month, call if you need  Me before  Pls keep appt with Dr Oneida Alar  Flu vaccine today  Fasting lipid, cmp and eGFR, tSH and Vit D and pSA in  5 months  Please work on good  health habits so that your health will improve. 1. Commitment to daily physical activity for 30 to 60  minutes, if you are able to do this.  2. Commitment to wise food choices. Aim for half of your  food intake to be vegetable and fruit, one quarter starchy foods, and one quarter protein. Try to eat on a regular schedule  3 meals per day, snacking between meals should be limited to vegetables or fruits or small portions of nuts. 64 ounces of water per day is generally recommended, unless you have specific health conditions, like heart failure or kidney failure where you will need to limit fluid intake.  3. Commitment to sufficient and a  good quality of physical and mental rest daily, generally between 6 to 8 hours per day.  WITH PERSISTANCE AND PERSEVERANCE, THE IMPOSSIBLE , BECOMES THE NORM! Thank you  for choosing [facility]. We consider it a privelige to serve you.  Delivering excellent health care in a caring and  compassionate way is our goal.  Partnering with you,  so that together we can achieve this goal is our strategy.

## 2016-06-18 DIAGNOSIS — Z23 Encounter for immunization: Secondary | ICD-10-CM

## 2016-06-18 DIAGNOSIS — Z1211 Encounter for screening for malignant neoplasm of colon: Secondary | ICD-10-CM | POA: Diagnosis not present

## 2016-06-23 ENCOUNTER — Encounter: Payer: Self-pay | Admitting: Gastroenterology

## 2016-06-23 ENCOUNTER — Ambulatory Visit (INDEPENDENT_AMBULATORY_CARE_PROVIDER_SITE_OTHER): Payer: Medicare Other | Admitting: Gastroenterology

## 2016-06-23 ENCOUNTER — Other Ambulatory Visit: Payer: Self-pay

## 2016-06-23 VITALS — BP 135/92 | HR 56 | Temp 97.6°F | Ht 72.0 in | Wt 178.2 lb

## 2016-06-23 DIAGNOSIS — Z86010 Personal history of colonic polyps: Secondary | ICD-10-CM

## 2016-06-23 DIAGNOSIS — D126 Benign neoplasm of colon, unspecified: Secondary | ICD-10-CM

## 2016-06-23 DIAGNOSIS — K921 Melena: Secondary | ICD-10-CM | POA: Diagnosis not present

## 2016-06-23 MED ORDER — PEG-KCL-NACL-NASULF-NA ASC-C 100 G PO SOLR: 1.0000 | ORAL | 0 refills | Status: DC

## 2016-06-23 NOTE — Progress Notes (Signed)
Primary Care Physician:  Tula Nakayama, MD  Primary Gastroenterologist:  Barney Drain, MD   Chief Complaint  Patient presents with  . Colonoscopy    hx polyps    HPI:  Marc Ward is a 65 y.o. male here To schedule colonoscopy. He was due for surveillance colonoscopy because of history of adenomatous colon polyps in the past. Last colonoscopy was done in December 2012. Back in 2010 he had a tubulovillous adenoma removed from the sigmoid colon. Patient recently underwent a physical. He was heme positive on exam. He has a history of chronic anemia for years in the setting of sickle cell trait, no evidence of iron deficiency on labs back in May.  Patient denies any major issues. He's had a little constipation recently. No diarrhea. No abdominal pain. No melena, brbpr. Denies upper GI symptoms such as heartburn, vomiting, dysphagia. Weight has been fairly stable.  Current Outpatient Prescriptions  Medication Sig Dispense Refill  . acetaminophen (TYLENOL) 500 MG tablet Take 1,000 mg by mouth every 6 (six) hours as needed.    Marland Kitchen albuterol (PROVENTIL HFA;VENTOLIN HFA) 108 (90 Base) MCG/ACT inhaler Inhale 2 puffs into the lungs every 6 (six) hours as needed for wheezing or shortness of breath. 18 g 0  . amLODipine (NORVASC) 5 MG tablet TAKE 1 TABLET (5 MG TOTAL) BY MOUTH DAILY. 90 tablet 1  . aspirin 81 MG tablet Take 81 mg by mouth daily.    . cholecalciferol (VITAMIN D) 1000 UNITS tablet Take 1,000 Units by mouth daily.    . Fluticasone Furoate-Vilanterol (BREO ELLIPTA IN) Inhale 1 puff into the lungs as needed.    Marland Kitchen omeprazole (PRILOSEC) 20 MG capsule TAKE 1 CAPSULE (20 MG TOTAL) BY MOUTH DAILY. 90 capsule 1   No current facility-administered medications for this visit.     Allergies as of 06/23/2016  . (No Known Allergies)    Past Medical History:  Diagnosis Date  . H. pylori infection FEB 2011   ABO BID x1O DAYS  . Hypertension 2009  . IDA (iron deficiency anemia)   . Sickle cell  trait ([place])   . Substance use disorder    cocaine and marijuana  . Tubulovillous adenoma of colon 2013   currently under close surveillance by GI    Past Surgical History:  Procedure Laterality Date  . ADENOIDECTOMY    . BOWEL RESECTION  11/08/2011   Procedure: SMALL BOWEL RESECTION;  Surgeon: Jamesetta So, MD;  Location: [facility];  Service: General;  Laterality: N/A;  . CIRCUMCISION    . COLONOSCOPY  AUG 2010   ADVANCED  ADENOMA, TICS,   . LAPAROTOMY  11/08/2011   Procedure: EXPLORATORY LAPAROTOMY;  Surgeon: Jamesetta So, MD;  Location: [facility];  Service: General;  Laterality: N/A;  . TONSILLECTOMY    . UPPER GASTROINTESTINAL ENDOSCOPY  FEB 2011   H. PYLORI    Family History  Problem Relation Age of Onset  . Hypertension Mother   . Diabetes Father   . Hypertension Brother   . Colon cancer Neg Hx     Social History   Social History  . Marital status: Divorced    Spouse name: N/A  . Number of children: 3  . Years of education: N/A   Occupational History  . Unemployed    .  [employer]   Social History Main Topics  . Smoking status: Never Smoker  . Smokeless tobacco: Never Used  . Alcohol use Yes     Comment: occasional  drinker-2 beers/wk  . Drug use:     Frequency: 1.0 time per week    Types: Marijuana, Cocaine     Comment: Last use "a couple of days ago I used cocaine."  . Sexual activity: Yes    Partners: Female    Birth control/ protection: None     Comment: girlfriend   Other Topics Concern  . Not on file   Social History Narrative  . No narrative on file      ROS:  General: Negative for anorexia, weight loss, fever, chills, fatigue, weakness. Eyes: Negative for vision changes.  ENT: Negative for hoarseness, difficulty swallowing , nasal congestion. CV: Negative for chest pain, angina, palpitations, dyspnea on exertion, peripheral edema.  Respiratory: Negative for dyspnea at rest, dyspnea on exertion, cough, sputum, wheezing.  GI:  See history of present illness. GU:  Negative for dysuria, hematuria, urinary incontinence, urinary frequency, nocturnal urination.  MS: Negative for joint pain, low back pain.  Derm: Negative for rash or itching.  Neuro: Negative for weakness, abnormal sensation, seizure, frequent headaches, memory loss, confusion.  Psych: Negative for anxiety, depression, suicidal ideation, hallucinations.  Endo: Negative for unusual weight change.  Heme: Negative for bruising or bleeding. Allergy: Negative for rash or hives.    Physical Examination:  BP (!) 135/92   Pulse (!) 56   Temp 97.6 F (36.4 C) (Oral)   Ht 6' (1.829 m)   Wt 178 lb 3.2 oz (80.8 kg)   BMI 24.17 kg/m    General: Well-nourished, well-developed in no acute distress.  Head: Normocephalic, atraumatic.   Eyes: Conjunctiva pink, no icterus. Mouth: Oropharyngeal mucosa moist and pink , no lesions erythema or exudate. Neck: Supple without thyromegaly, masses, or lymphadenopathy.  Lungs: Clear to auscultation bilaterally.  Heart: Regular rate and rhythm, no murmurs rubs or gallops.  Abdomen: Bowel sounds are normal, nontender, nondistended, no hepatosplenomegaly or masses, no abdominal bruits or    hernia , no rebound or guarding.   Rectal: deferred Extremities: No lower extremity edema. No clubbing or deformities.  Neuro: Alert and oriented x 4 , grossly normal neurologically.  Skin: Warm and dry, no rash or jaundice.   Psych: Alert and cooperative, normal mood and affect.  Labs: Lab Results  Component Value Date   IRON 102 08/03/2014   FERRITIN 138 11/21/2015   Lab Results  Component Value Date   WBC 6.5 09/20/2015   HGB 11.8 (L) 09/20/2015   HCT 36.4 (L) 09/20/2015   MCV 98.1 09/20/2015   PLT 170 09/20/2015   Lab Results  Component Value Date   CREATININE 1.10 09/20/2015   BUN 14 09/20/2015   NA 146 09/20/2015   K 3.9 09/20/2015   CL 107 09/20/2015   CO2 27 09/20/2015   Lab Results  Component Value Date    ALT 14 (L) 07/31/2015   AST 18 07/31/2015   ALKPHOS 45 07/31/2015   BILITOT 1.0 07/31/2015     Imaging Studies: No results found.

## 2016-06-23 NOTE — Patient Instructions (Signed)
PA for TCS: [id number]

## 2016-06-23 NOTE — Assessment & Plan Note (Signed)
65 year old gentleman due for surveillance colonoscopy for history of adenomatous colon polyps in the past, tubulovillous adenoma back in 2010. Last colonoscopy 2012 as outlined. Recently heme positive on digital rectal exam at time of physical by PCP. Chronically he is mildly anemic. Ferritin is normal. Recommend colonoscopy at this point. Because a history of drug use, recommend deep sedation in the OR. Patient informed that he needs to avoid illicit drug use in order undergo sedation.  I have discussed the risks, alternatives, benefits with regards to but not limited to the risk of reaction to medication, bleeding, infection, perforation and the patient is agreeable to proceed. Written consent to be obtained.

## 2016-06-23 NOTE — Progress Notes (Signed)
cc'ed to pcp °

## 2016-06-23 NOTE — Patient Instructions (Signed)
1. For constipation, take Miralax one capful once to twice daily until bowel movements are soft and then take daily as needed. 2. Colonoscopy as scheduled. See separate instructions.  3. Refrain from illicit drug use. It is bad for your health.

## 2016-07-01 NOTE — Patient Instructions (Signed)
Marc Ward  07/01/2016     @PREFPERIOPPHARMACY @   Your procedure is scheduled on  07/07/2016   Report to [facility] at  645  A.M.  Call this number if you have problems the morning of surgery:  [phone]   Remember:  Do not eat food or drink liquids after midnight.  Take these medicines the morning of surgery with A SIP OF WATER  Norvasc, omeprazole.   Do not wear jewelry, make-up or nail polish.  Do not wear lotions, powders, or perfumes, or deoderant.  Do not shave 48 hours prior to surgery.  Men may shave face and neck.  Do not bring valuables to the hospital.  [facility] is not responsible for any belongings or valuables.  Contacts, dentures or bridgework may not be worn into surgery.  Leave your suitcase in the car.  After surgery it may be brought to your room.  For patients admitted to the hospital, discharge time will be determined by your treatment team.  Patients discharged the day of surgery will not be allowed to drive home.   Name and phone number of your driver:   family Special instructions:  Follow the diet and prep instructions given to you by Dr Nona Dell office.  Please read over the following fact sheets that you were given. Anesthesia Post-op Instructions and Care and Recovery After Surgery       Colonoscopy, Adult A colonoscopy is an exam to look at the entire large intestine. During the exam, a lubricated, bendable tube is inserted into the anus and then passed into the rectum, colon, and other parts of the large intestine. A colonoscopy is often done as a part of normal colorectal screening or in response to certain symptoms, such as anemia, persistent diarrhea, abdominal pain, and blood in the stool. The exam can help screen for and diagnose medical problems, including:  Tumors.  Polyps.  Inflammation.  Areas of bleeding. Tell a health care provider about:  Any allergies you have.  All medicines you are taking,  including vitamins, herbs, eye drops, creams, and over-the-counter medicines.  Any problems you or family members have had with anesthetic medicines.  Any blood disorders you have.  Any surgeries you have had.  Any medical conditions you have.  Any problems you have had passing stool. What are the risks? Generally, this is a safe procedure. However, problems may occur, including:  Bleeding.  A tear in the intestine.  A reaction to medicines given during the exam.  Infection (rare). What happens before the procedure? Eating and drinking restrictions  Follow instructions from your health care provider about eating and drinking, which may include:  A few days before the procedure - follow a low-fiber diet. Avoid nuts, seeds, dried fruit, raw fruits, and vegetables.  1-3 days before the procedure - follow a clear liquid diet. Drink only clear liquids, such as clear broth or bouillon, black coffee or tea, clear juice, clear soft drinks or sports drinks, gelatin desert, and popsicles. Avoid any liquids that contain red or purple dye.  On the day of the procedure - do not eat or drink anything during the 2 hours before the procedure, or within the time period that your health care provider recommends. Bowel prep  If you were prescribed an oral bowel prep to clean out your colon:  Take it as told by your health care provider. Starting the day before your procedure, you will need  to drink a large amount of medicated liquid. The liquid will cause you to have multiple loose stools until your stool is almost clear or light green.  If your skin or anus gets irritated from diarrhea, you may use these to relieve the irritation:  Medicated wipes, such as adult wet wipes with aloe and vitamin E.  A skin soothing-product like petroleum jelly.  If you vomit while drinking the bowel prep, take a break for up to 60 minutes and then begin the bowel prep again. If vomiting continues and you cannot  take the bowel prep without vomiting, call your health care provider. General instructions  Ask your health care provider about changing or stopping your regular medicines. This is especially important if you are taking diabetes medicines or blood thinners.  Plan to have someone take you home from the hospital or clinic. What happens during the procedure?  An IV tube may be inserted into one of your veins.  You will be given medicine to help you relax (sedative).  To reduce your risk of infection:  Your health care team will wash or sanitize their hands.  Your anal area will be washed with soap.  You will be asked to lie on your side with your knees bent.  Your health care provider will lubricate a long, thin, flexible tube. The tube will have a camera and a light on the end.  The tube will be inserted into your anus.  The tube will be gently eased through your rectum and colon.  Air will be delivered into your colon to keep it open. You may feel some pressure or cramping.  The camera will be used to take images during the procedure.  A small tissue sample may be removed from your body to be examined under a microscope (biopsy). If any potential problems are found, the tissue will be sent to a lab for testing.  If small polyps are found, your health care provider may remove them and have them checked for cancer cells.  The tube that was inserted into your anus will be slowly removed. The procedure may vary among health care providers and hospitals. What happens after the procedure?  Your blood pressure, heart rate, breathing rate, and blood oxygen level will be monitored until the medicines you were given have worn off.  Do not drive for 24 hours after the exam.  You may have a small amount of blood in your stool.  You may pass gas and have mild abdominal cramping or bloating due to the air that was used to inflate your colon during the exam.  It is up to you to get the  results of your procedure. Ask your health care provider, or the department performing the procedure, when your results will be ready. This information is not intended to replace advice given to you by your health care provider. Make sure you discuss any questions you have with your health care provider. Document Released: 07/03/2000 Document Revised: 01/24/2016 Document Reviewed: 09/17/2015 Elsevier Interactive Patient Education  2017 [employer]  Colonoscopy, Adult, Care After This sheet gives you information about how to care for yourself after your procedure. Your health care provider may also give you more specific instructions. If you have problems or questions, contact your health care provider. What can I expect after the procedure? After the procedure, it is common to have:  A small amount of blood in your stool for 24 hours after the procedure.  Some gas.  Mild  abdominal cramping or bloating. Follow these instructions at home: General instructions  For the first 24 hours after the procedure:  Do not drive or use machinery.  Do not sign important documents.  Do not drink alcohol.  Do your regular daily activities at a slower pace than normal.  Eat soft, easy-to-digest foods.  Rest often.  Take over-the-counter or prescription medicines only as told by your health care provider.  It is up to you to get the results of your procedure. Ask your health care provider, or the department performing the procedure, when your results will be ready. Relieving cramping and bloating  Try walking around when you have cramps or feel bloated.  Apply heat to your abdomen as told by your health care provider. Use a heat source that your health care provider recommends, such as a moist heat pack or a heating pad.  Place a towel between your skin and the heat source.  Leave the heat on for 20-30 minutes.  Remove the heat if your skin turns bright red. This is especially important  if you are unable to feel pain, heat, or cold. You may have a greater risk of getting burned. Eating and drinking  Drink enough fluid to keep your urine clear or pale yellow.  Resume your normal diet as instructed by your health care provider. Avoid heavy or fried foods that are hard to digest.  Avoid drinking alcohol for as long as instructed by your health care provider. Contact a health care provider if:  You have blood in your stool 2-3 days after the procedure. Get help right away if:  You have more than a small spotting of blood in your stool.  You pass large blood clots in your stool.  Your abdomen is swollen.  You have nausea or vomiting.  You have a fever.  You have increasing abdominal pain that is not relieved with medicine. This information is not intended to replace advice given to you by your health care provider. Make sure you discuss any questions you have with your health care provider. Document Released: 02/18/2004 Document Revised: 03/30/2016 Document Reviewed: 09/17/2015 Elsevier Interactive Patient Education  2017 [place] Anesthesia is a term that refers to techniques, procedures, and medicines that help a person stay safe and comfortable during a medical procedure. Monitored anesthesia care, or sedation, is one type of anesthesia. Your anesthesia specialist may recommend sedation if you will be having a procedure that does not require you to be unconscious, such as:  Cataract surgery.  A dental procedure.  A biopsy.  A colonoscopy. During the procedure, you may receive a medicine to help you relax (sedative). There are three levels of sedation:  Mild sedation. At this level, you may feel awake and relaxed. You will be able to follow directions.  Moderate sedation. At this level, you will be sleepy. You may not remember the procedure.  Deep sedation. At this level, you will be asleep. You will not remember the  procedure. The more medicine you are given, the deeper your level of sedation will be. Depending on how you respond to the procedure, the anesthesia specialist may change your level of sedation or the type of anesthesia to fit your needs. An anesthesia specialist will monitor you closely during the procedure. Let your health care provider know about:  Any allergies you have.  All medicines you are taking, including vitamins, herbs, eye drops, creams, and over-the-counter medicines.  Any use of steroids (by  mouth or as a cream).  Any problems you or family members have had with sedatives and anesthetic medicines.  Any blood disorders you have.  Any surgeries you have had.  Any medical conditions you have, such as sleep apnea.  Whether you are pregnant or may be pregnant.  Any use of cigarettes, alcohol, or street drugs. What are the risks? Generally, this is a safe procedure. However, problems may occur, including:  Getting too much medicine (oversedation).  Nausea.  Allergic reaction to medicines.  Trouble breathing. If this happens, a breathing tube may be used to help with breathing. It will be removed when you are awake and breathing on your own.  Heart trouble.  Lung trouble. Before the procedure Staying hydrated  Follow instructions from your health care provider about hydration, which may include:  Up to 2 hours before the procedure - you may continue to drink clear liquids, such as water, clear fruit juice, black coffee, and plain tea. Eating and drinking restrictions  Follow instructions from your health care provider about eating and drinking, which may include:  8 hours before the procedure - stop eating heavy meals or foods such as meat, fried foods, or fatty foods.  6 hours before the procedure - stop eating light meals or foods, such as toast or cereal.  6 hours before the procedure - stop drinking milk or drinks that contain milk.  2 hours before the  procedure - stop drinking clear liquids. Medicines  Ask your health care provider about:  Changing or stopping your regular medicines. This is especially important if you are taking diabetes medicines or blood thinners.  Taking medicines such as aspirin and ibuprofen. These medicines can thin your blood. Do not take these medicines before your procedure if your health care provider instructs you not to. Tests and exams  You will have a physical exam.  You may have blood tests done to show:  How well your kidneys and liver are working.  How well your blood can clot.  General instructions  Plan to have someone take you home from the hospital or clinic.  If you will be going home right after the procedure, plan to have someone with you for 24 hours. What happens during the procedure?  Your blood pressure, heart rate, breathing, level of pain and overall condition will be monitored.  An IV tube will be inserted into one of your veins.  Your anesthesia specialist will give you medicines as needed to keep you comfortable during the procedure. This may mean changing the level of sedation.  The procedure will be performed. After the procedure  Your blood pressure, heart rate, breathing rate, and blood oxygen level will be monitored until the medicines you were given have worn off.  Do not drive for 24 hours if you received a sedative.  You may:  Feel sleepy, clumsy, or nauseous.  Feel forgetful about what happened after the procedure.  Have a sore throat if you had a breathing tube during the procedure.  Vomit. This information is not intended to replace advice given to you by your health care provider. Make sure you discuss any questions you have with your health care provider. Document Released: 04/01/2005 Document Revised: 12/13/2015 Document Reviewed: 10/27/2015 Elsevier Interactive Patient Education  2017 [place] POST-ANESTHESIA  IMMEDIATELY  FOLLOWING SURGERY:  Do not drive or operate machinery for the first twenty four hours after surgery.  Do not make any important decisions for twenty four hours after  surgery or while taking narcotic pain medications or sedatives.  If you develop intractable nausea and vomiting or a severe headache please notify your doctor immediately.  FOLLOW-UP:  Please make an appointment with your surgeon as instructed. You do not need to follow up with anesthesia unless specifically instructed to do so.  WOUND CARE INSTRUCTIONS (if applicable):  Keep a dry clean dressing on the anesthesia/puncture wound site if there is drainage.  Once the wound has quit draining you may leave it open to air.  Generally you should leave the bandage intact for twenty four hours unless there is drainage.  If the epidural site drains for more than 36-48 hours please call the anesthesia department.  QUESTIONS?:  Please feel free to call your physician or the hospital operator if you have any questions, and they will be happy to assist you.

## 2016-07-02 ENCOUNTER — Encounter (HOSPITAL_COMMUNITY)
Admission: RE | Admit: 2016-07-02 | Discharge: 2016-07-02 | Disposition: A | Discharge status: Home / Self Care | Payer: Medicare Other | Source: Ambulatory Visit | Attending: Gastroenterology | Admitting: Gastroenterology

## 2016-07-02 ENCOUNTER — Telehealth: Payer: Self-pay | Admitting: Gastroenterology

## 2016-07-02 ENCOUNTER — Encounter (HOSPITAL_COMMUNITY): Payer: Self-pay

## 2016-07-02 DIAGNOSIS — Z01812 Encounter for preprocedural laboratory examination: Secondary | ICD-10-CM | POA: Diagnosis not present

## 2016-07-02 DIAGNOSIS — Z86010 Personal history of colonic polyps: Secondary | ICD-10-CM | POA: Diagnosis not present

## 2016-07-02 HISTORY — DX: Personal history of urinary calculi: Z87.442

## 2016-07-02 HISTORY — DX: Chronic obstructive pulmonary disease, unspecified: J44.9

## 2016-07-02 HISTORY — DX: Gastro-esophageal reflux disease without esophagitis: K21.9

## 2016-07-02 LAB — BASIC METABOLIC PANEL
Anion gap: 7 (ref 5–15)
BUN: 13 mg/dL (ref 6–20)
CHLORIDE: 108 mmol/L (ref 101–111)
CO2: 26 mmol/L (ref 22–32)
CREATININE: 1.15 mg/dL (ref 0.61–1.24)
Calcium: 8.7 mg/dL — ABNORMAL LOW (ref 8.9–10.3)
GFR calc Af Amer: >60 mL/min (ref >60)
GFR calc non Af Amer: >60 mL/min (ref >60)
Glucose, Bld: 112 mg/dL — ABNORMAL HIGH (ref 65–99)
POTASSIUM: 2.9 mmol/L — AB (ref 3.5–5.1)
Sodium: 141 mmol/L (ref 135–145)

## 2016-07-02 LAB — RAPID URINE DRUG SCREEN, HOSP PERFORMED
AMPHETAMINES: NOT DETECTED
BARBITURATES: NOT DETECTED
Benzodiazepines: NOT DETECTED
COCAINE: POSITIVE — AB
Opiates: NOT DETECTED
TETRAHYDROCANNABINOL: NOT DETECTED

## 2016-07-02 LAB — CBC WITH DIFFERENTIAL/PLATELET
Basophils Absolute: 0.1 10*3/uL (ref 0.0–0.1)
Basophils Relative: 1 %
Eosinophils Absolute: 0.6 10*3/uL (ref 0.0–0.7)
Eosinophils Relative: 8 %
HEMATOCRIT: 37.1 % — AB (ref 39.0–52.0)
HEMOGLOBIN: 12.5 g/dL — AB (ref 13.0–17.0)
LYMPHS ABS: 1.8 10*3/uL (ref 0.7–4.0)
LYMPHS PCT: 27 %
MCH: 32.7 pg (ref 26.0–34.0)
MCHC: 33.7 g/dL (ref 30.0–36.0)
MCV: 97.1 fL (ref 78.0–100.0)
MONOS PCT: 8 %
Monocytes Absolute: 0.5 10*3/uL (ref 0.1–1.0)
NEUTROS ABS: 3.7 10*3/uL (ref 1.7–7.7)
NEUTROS PCT: 56 %
Platelets: 146 10*3/uL — ABNORMAL LOW (ref 150–400)
RBC: 3.82 MIL/uL — ABNORMAL LOW (ref 4.22–5.81)
RDW: 13.3 % (ref 11.5–15.5)
WBC: 6.6 10*3/uL (ref 4.0–10.5)

## 2016-07-02 MED ORDER — POTASSIUM CHLORIDE ER 20 MEQ PO TBCR: EXTENDED_RELEASE_TABLET | ORAL | 0 refills | Status: DC

## 2016-07-02 NOTE — Pre-Procedure Instructions (Signed)
Patient in for PAT and admits using cocaine, "maybe a week ago". Drug screen positive for cocaine. Dr Patsey Berthold notified and states we can recheck him am of surgery and cancel then if positive or just cancel now, depending on what Dr Nona Dell wants to do. Left Ginger at [facility] a message to talk to Dr Nona Dell and ask what she wanted to do. Ginger calls back and states that Dr Nona Dell wants [place] to inform patient that he must be clean for 30 days before can do procedure and to also notify him that his potassium is low. Ginger states Dr Nona Dell will handle this and let patient know what we need to do for that. I contacted patient and told him of above information. I instructed him he could go ahead and pick up his prep for now but to hold it and not drink it until he is rescheduled and advised by office when to start it. He verbalized understanding and states he will call Dr Nona Dell office to reschedule.

## 2016-07-02 NOTE — Telephone Encounter (Signed)
PLEASE CALL PT. HIS K IS LOW. HE NEEDS 40 MEQ PO BID FOR 4 DOSES. HE WILL NEED TO [facility] HIS ENDOSCOPY FOR ONE MO. NO COCAINE WITHIN THE NEXT 4 WEEKS. GIVING PROPOFOL WITH COCAINE USE CAN CAUSE CARDIAC ARREST.

## 2016-07-02 NOTE — Telephone Encounter (Signed)
Pt is aware of prescription and the procedure.

## 2016-07-07 ENCOUNTER — Ambulatory Visit (HOSPITAL_COMMUNITY): Admission: RE | Admit: 2016-07-07 | Payer: Medicare Other | Source: Ambulatory Visit | Admitting: Gastroenterology

## 2016-07-07 ENCOUNTER — Encounter (HOSPITAL_COMMUNITY): Admission: RE | Payer: Self-pay | Source: Ambulatory Visit

## 2016-07-07 SURGERY — COLONOSCOPY WITH PROPOFOL
Anesthesia: Monitor Anesthesia Care

## 2016-07-10 ENCOUNTER — Other Ambulatory Visit: Payer: Self-pay | Admitting: Family Medicine

## 2016-07-11 NOTE — Progress Notes (Signed)
REVIEWED. [facility] CANCELLED DUE TO POS UDS FOR COCAINE. PT WILL RSC AFTER NO COCAINE FOR 30 DAYS.

## 2016-08-06 ENCOUNTER — Telehealth: Payer: Self-pay | Admitting: Family Medicine

## 2016-08-06 NOTE — Telephone Encounter (Signed)
Left message about setting up AWV in April and to verify that she has not already arranged for in home [facility] visit.

## 2016-09-22 ENCOUNTER — Encounter: Payer: Self-pay | Admitting: Gastroenterology

## 2016-11-12 ENCOUNTER — Ambulatory Visit: Payer: Medicare Other | Admitting: Family Medicine

## 2016-12-11 DIAGNOSIS — D649 Anemia, unspecified: Secondary | ICD-10-CM | POA: Diagnosis not present

## 2016-12-11 DIAGNOSIS — E559 Vitamin D deficiency, unspecified: Secondary | ICD-10-CM | POA: Diagnosis not present

## 2016-12-11 DIAGNOSIS — Z0000 Encounter for general adult medical examination without abnormal findings: Secondary | ICD-10-CM | POA: Diagnosis not present

## 2016-12-11 LAB — CBC
HEMATOCRIT: 36.3 % — AB (ref 38.5–50.0)
HEMOGLOBIN: 11.9 g/dL — AB (ref 13.2–17.1)
MCH: 32.5 pg (ref 27.0–33.0)
MCHC: 32.8 g/dL (ref 32.0–36.0)
MCV: 99.2 fL (ref 80.0–100.0)
MPV: 10.4 fL (ref 7.5–12.5)
Platelets: 144 10*3/uL (ref 140–400)
RBC: 3.66 MIL/uL — ABNORMAL LOW (ref 4.20–5.80)
RDW: 13.8 % (ref 11.0–15.0)
WBC: 7.1 10*3/uL (ref 3.8–10.8)

## 2016-12-11 LAB — BASIC METABOLIC PANEL
BUN: 13 mg/dL (ref 7–25)
CALCIUM: 8.5 mg/dL — AB (ref 8.6–10.3)
CO2: 27 mmol/L (ref 20–31)
CREATININE: 1.34 mg/dL — AB (ref 0.70–1.25)
Chloride: 108 mmol/L (ref 98–110)
Glucose, Bld: 106 mg/dL — ABNORMAL HIGH (ref 65–99)
Potassium: 3.7 mmol/L (ref 3.5–5.3)
Sodium: 143 mmol/L (ref 135–146)

## 2016-12-11 LAB — IRON: IRON: 68 ug/dL (ref 50–180)

## 2016-12-11 LAB — FERRITIN: Ferritin: 130 ng/mL (ref 20–380)

## 2016-12-12 LAB — VITAMIN D 25 HYDROXY (VIT D DEFICIENCY, FRACTURES): Vit D, 25-Hydroxy: 34 ng/mL (ref 30–100)

## 2016-12-16 ENCOUNTER — Encounter: Payer: Self-pay | Admitting: Family Medicine

## 2016-12-16 ENCOUNTER — Ambulatory Visit (INDEPENDENT_AMBULATORY_CARE_PROVIDER_SITE_OTHER): Payer: Medicare Other | Admitting: Family Medicine

## 2016-12-16 VITALS — BP 130/76 | HR 54 | Resp 16 | Ht 72.0 in | Wt 183.0 lb

## 2016-12-16 DIAGNOSIS — Z23 Encounter for immunization: Secondary | ICD-10-CM

## 2016-12-16 DIAGNOSIS — J449 Chronic obstructive pulmonary disease, unspecified: Secondary | ICD-10-CM | POA: Diagnosis not present

## 2016-12-16 DIAGNOSIS — F1490 Cocaine use, unspecified, uncomplicated: Secondary | ICD-10-CM | POA: Diagnosis not present

## 2016-12-16 DIAGNOSIS — I10 Essential (primary) hypertension: Secondary | ICD-10-CM | POA: Diagnosis not present

## 2016-12-16 DIAGNOSIS — K219 Gastro-esophageal reflux disease without esophagitis: Secondary | ICD-10-CM | POA: Diagnosis not present

## 2016-12-16 NOTE — Patient Instructions (Addendum)
Annual exam Dec1 , or shortly after, please  call if you nee me sooner  No changes in medication  Pls work on health habits to promote good health, need to  STOP drug use It is important that you exercise regularly at least 30 minutes 5 times a week. If you develop chest pain, have severe difficulty breathing, or feel very tired, stop exercising immediately and seek medical attention   Pneumonia 23 today  Please arrange to get your colonoscopy done this is overdue and you do need it !   Thank you  for choosing [facility]. We consider it a privelige to serve you.  Delivering excellent health care in a caring and  compassionate way is our goal.  Partnering with you,  so that together we can achieve this goal is our strategy.

## 2016-12-20 DIAGNOSIS — Z23 Encounter for immunization: Secondary | ICD-10-CM | POA: Insufficient documentation

## 2016-12-20 NOTE — Assessment & Plan Note (Signed)
Intermittent need for PPI pe his report

## 2016-12-20 NOTE — Assessment & Plan Note (Signed)
rept colonoscopy past due , pt states he will contact GI, last scheduled colonoscopy cancelled due to positive cocaine in urine

## 2016-12-20 NOTE — Assessment & Plan Note (Signed)
Infrequent flares , uses only rescue inhaler when needed

## 2016-12-20 NOTE — Assessment & Plan Note (Signed)
After obtaining informed consent, the vaccine is  administered by LPN.  

## 2016-12-20 NOTE — Progress Notes (Signed)
   Srikar Chiang     MRN: [id number]      DOB: May 13, 1951   HPI Mr. Coey is here for follow up and re-evaluation of chronic medical conditions, medication management and review of any available recent lab and radiology data.  Preventive health is updated, specifically  Cancer screening and Immunization.   Questions or concerns regarding consultations or procedures which the PT has had in the interim are  addressed. The PT denies any adverse reactions to current medications since the last visit.  There are no new concerns.  There are no specific complaints   ROS Denies recent fever or chills. Denies sinus pressure, nasal congestion, ear pain or sore throat. Denies chest congestion, productive cough or wheezing. Denies chest pains, palpitations and leg swelling Denies abdominal pain, nausea, vomiting,diarrhea or constipation.   Denies dysuria, frequency, hesitancy or incontinence. Denies joint pain, swelling and limitation in mobility. Denies headaches, seizures, numbness, or tingling. Denies depression, anxiety or insomnia. Denies skin break down or rash.   PE  BP 130/76   Pulse (!) 54   Resp 16   Ht 6' (1.829 m)   Wt 183 lb (83 kg)   SpO2 96%   BMI 24.82 kg/m   Patient alert and oriented and in no cardiopulmonary distress.  HEENT: No facial asymmetry, EOMI,   oropharynx pink and moist.  Neck supple no JVD, no mass.  Chest: Clear to auscultation bilaterally.  CVS: S1, S2 no murmurs, no S3.Regular rate.  ABD: Soft non tender.   Ext: No edema  MS: Adequate ROM spine, shoulders, hips and knees.  Skin: Intact, no ulcerations or rash noted.  Psych: Good eye contact, normal affect. Memory intact not anxious or depressed appearing.  CNS: CN 2-12 intact, power,  normal throughout.no focal deficits noted.   Assessment & Plan  Essential hypertension Controlled, no change in medication DASH diet and commitment to daily physical activity for a minimum of 30 minutes  discussed and encouraged, as a part of hypertension management. The importance of attaining a healthy weight is also discussed.  BP/Weight 12/16/2016 07/02/2016 06/23/2016 06/17/2016 02/04/2016 11/25/2015 4/48/1856  Systolic BP 314 970 263 785 885 027 741  Diastolic BP 76 92 92 76 80 80 90  Wt. (Lbs) 183 179 178.2 181.08 176 176 171  BMI 24.82 24.28 24.17 24.56 23.86 23.86 23.19       Cocaine use Ongoing use, counseled to quit and encouraged to join treatment program, no interest. 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   TUBULOVILLOUS ADENOMA, COLON rept colonoscopy past due , pt states he will contact GI, last scheduled colonoscopy cancelled due to positive cocaine in urine

## 2016-12-20 NOTE — Assessment & Plan Note (Signed)
Ongoing use, counseled to quit and encouraged to join treatment program, no interest. 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

## 2016-12-20 NOTE — Assessment & Plan Note (Signed)
Controlled, no change in medication DASH diet and commitment to daily physical activity for a minimum of 30 minutes discussed and encouraged, as a part of hypertension management. The importance of attaining a healthy weight is also discussed.  BP/Weight 12/16/2016 07/02/2016 06/23/2016 06/17/2016 02/04/2016 11/25/2015 8/47/8412  Systolic BP 820 813 887 195 974 718 550  Diastolic BP 76 92 92 76 80 80 90  Wt. (Lbs) 183 179 178.2 181.08 176 176 171  BMI 24.82 24.28 24.17 24.56 23.86 23.86 23.19

## 2017-01-07 IMAGING — DX DG CHEST 2V
2 series · 2 of 2 positions shown · non-contrast
Comparison: Chest radiograph 08/12/2015.

CLINICAL DATA: Patient with wheezing and shortness of breath.

EXAM:
CHEST  2 VIEW

[chest pa]
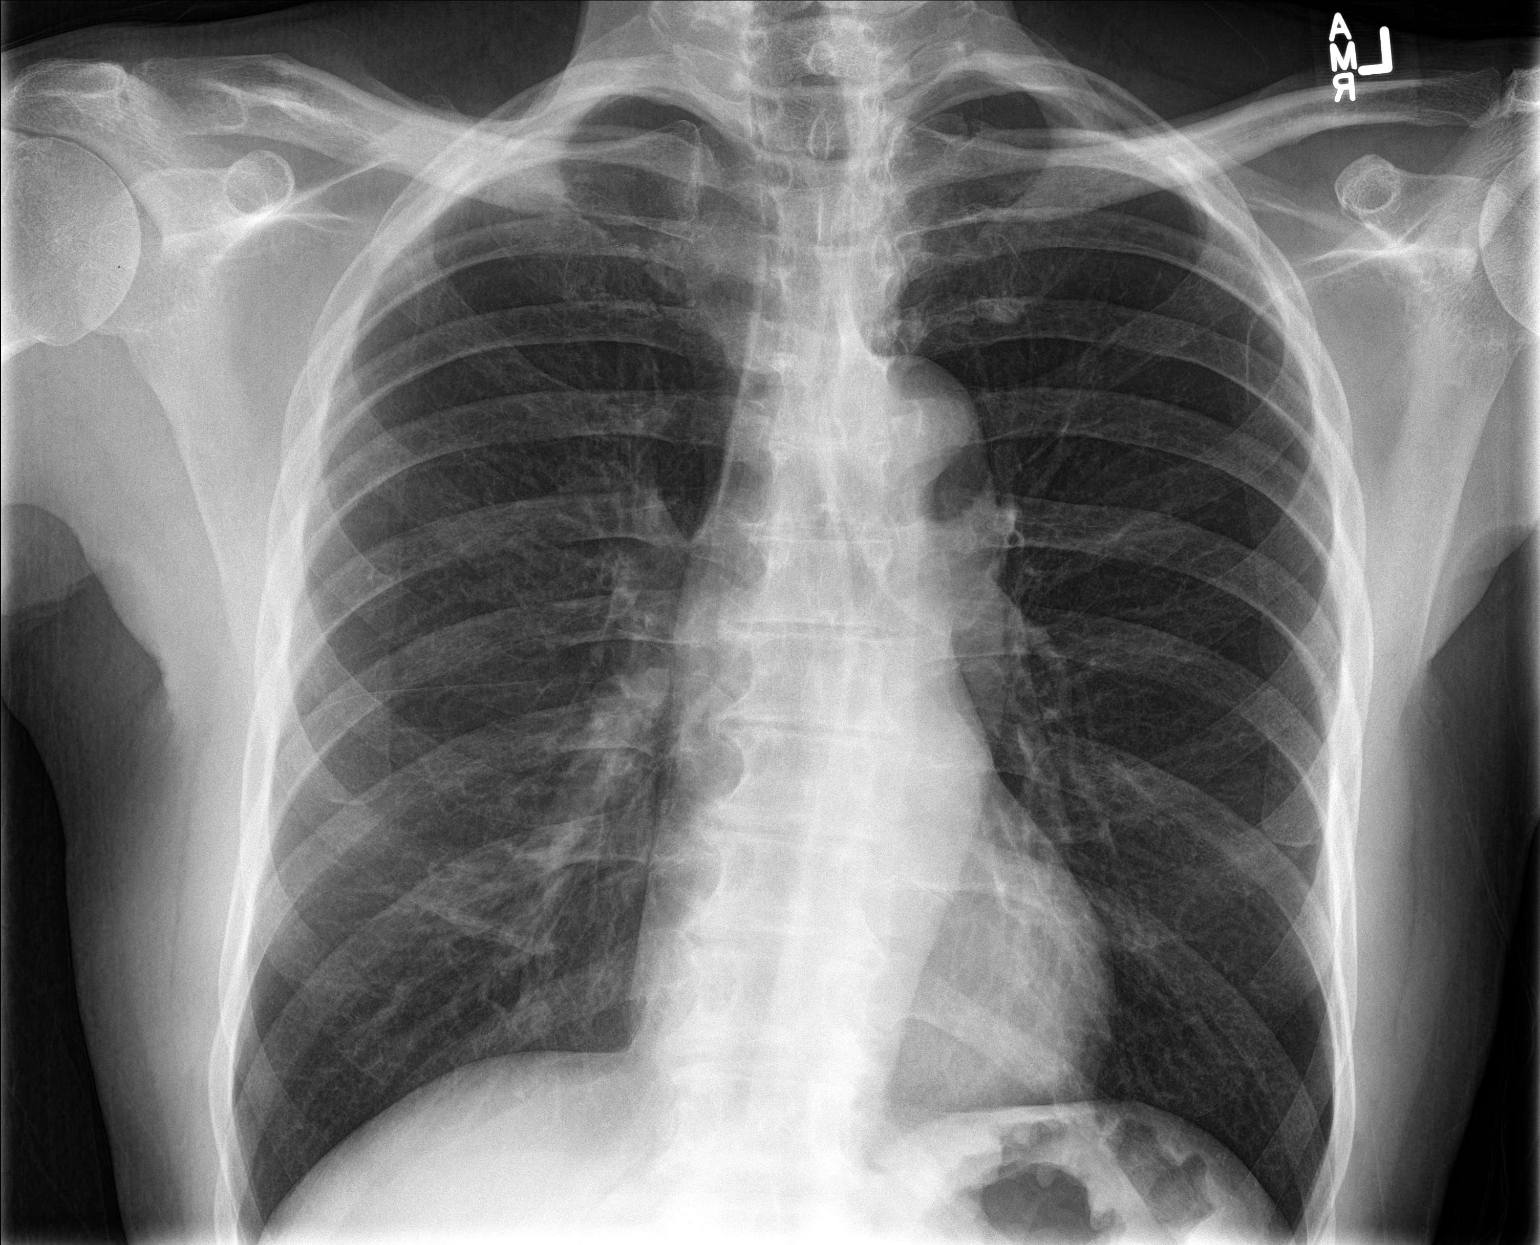

[chest lat]
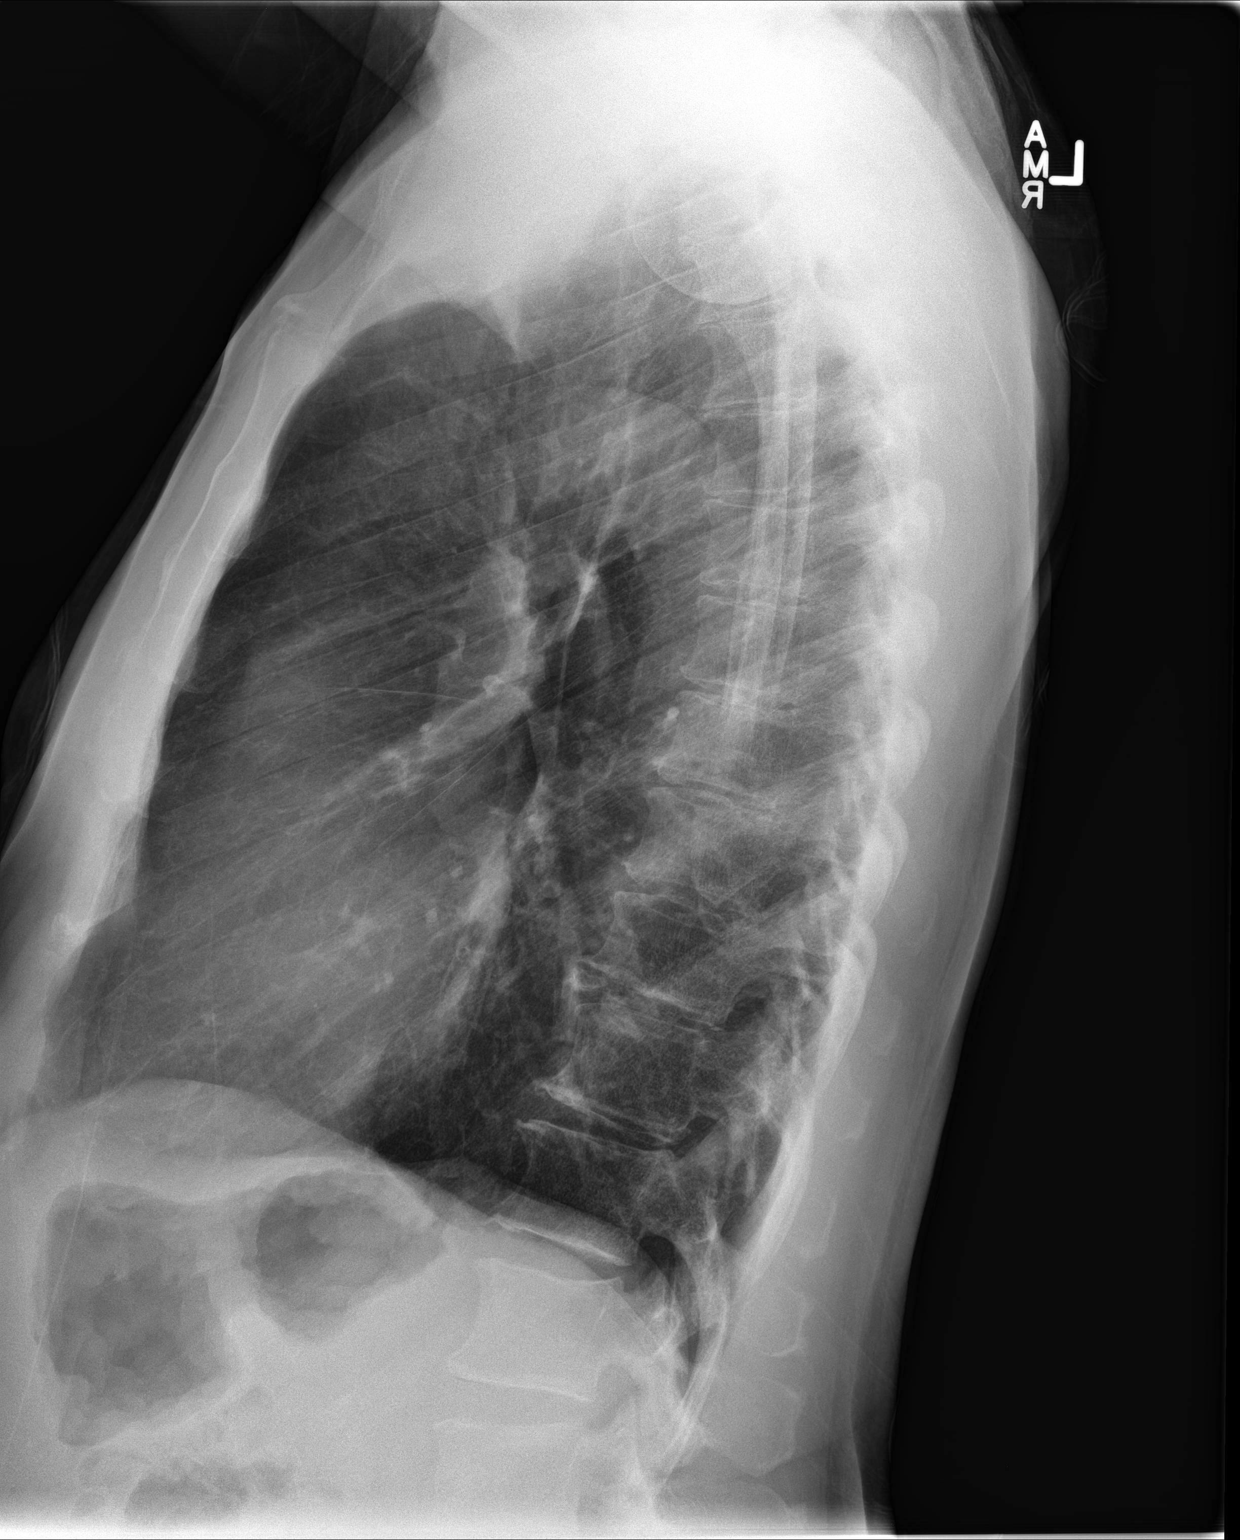

[2 of 2 positions shown; findings below may reference images not displayed]

FINDINGS: Stable cardiac and mediastinal contours with mild tortuosity of the
thoracic aorta. No consolidative pulmonary opacities. No pleural
effusion or pneumothorax. Regional skeleton is unremarkable.
IMPRESSION: No acute cardiopulmonary process.

## 2017-01-10 ENCOUNTER — Other Ambulatory Visit: Payer: Self-pay | Admitting: Family Medicine

## 2017-01-12 ENCOUNTER — Ambulatory Visit (INDEPENDENT_AMBULATORY_CARE_PROVIDER_SITE_OTHER): Payer: Medicare Other

## 2017-01-12 VITALS — BP 138/90 | HR 72 | Temp 97.8°F | Ht 72.0 in | Wt 182.0 lb

## 2017-01-12 DIAGNOSIS — Z0000 Encounter for general adult medical examination without abnormal findings: Secondary | ICD-10-CM | POA: Diagnosis not present

## 2017-01-12 NOTE — Patient Instructions (Signed)
[name] , Thank you for taking time to come for your Medicare Wellness Visit. I appreciate your ongoing commitment to your health goals. Please review the following plan we discussed and let me know if I can assist you in the future.   Screening recommendations/referrals: Colonoscopy: Due now, please call Dr. Oneida Ward office as soon as you are ready to complete this. Recommended yearly ophthalmology/optometry visit for glaucoma screening and checkup Recommended yearly dental visit for hygiene and checkup  Vaccinations: Influenza vaccine: Up to date, next due 05/2017 Pneumococcal vaccine: Up to date Tdap vaccine: Up to date, next due 01/2020 Shingles vaccine: Due, declines today  Advanced directives: Advance directive discussed with you today. I have provided a copy for you to complete at home and have notarized. Once this is complete please bring a copy in to our office so we can scan it into your chart.  Next appointment: Follow up with Dr. Moshe Ward on 06/22/2017 at 1:00 pm. Follow up in 1 year for your annual wellness visit.  Preventive Care 66 Years and Older, Male Preventive care refers to lifestyle choices and visits with your health care provider that can promote health and wellness. What does preventive care include?  A yearly physical exam. This is also called an annual well check.  Dental exams once or twice a year.  Routine eye exams. Ask your health care provider how often you should have your eyes checked.  Personal lifestyle choices, including:  Daily care of your teeth and gums.  Regular physical activity.  Eating a healthy diet.  Avoiding tobacco and drug use.  Limiting alcohol use.  Practicing safe sex.  Taking low doses of aspirin every day.  Taking vitamin and mineral supplements as recommended by your health care provider. What happens during an annual well check? The services and screenings done by your health care provider during your annual well check  will depend on your age, overall health, lifestyle risk factors, and family history of disease. Counseling  Your health care provider may ask you questions about your:  Alcohol use.  Tobacco use.  Drug use.  Emotional well-being.  Home and relationship well-being.  Sexual activity.  Eating habits.  History of falls.  Memory and ability to understand (cognition).  Work and work Statistician. Screening  You may have the following tests or measurements:  Height, weight, and BMI.  Blood pressure.  Lipid and cholesterol levels. These may be checked every 5 years, or more frequently if you are over 90 years old.  Skin check.  Lung cancer screening. You may have this screening every year starting at age 68 if you have a 30-pack-year history of smoking and currently smoke or have quit within the past 15 years.  Fecal occult blood test (FOBT) of the stool. You may have this test every year starting at age 68.  Flexible sigmoidoscopy or colonoscopy. You may have a sigmoidoscopy every 5 years or a colonoscopy every 10 years starting at age 9.  Prostate cancer screening. Recommendations will vary depending on your family history and other risks.  Hepatitis C blood test.  Hepatitis B blood test.  Sexually transmitted disease (STD) testing.  Diabetes screening. This is done by checking your blood sugar (glucose) after you have not eaten for a while (fasting). You may have this done every 1-3 years.  Abdominal aortic aneurysm (AAA) screening. You may need this if you are a current or former smoker.  Osteoporosis. You may be screened starting at age 72 if  you are at high risk. Talk with your health care provider about your test results, treatment options, and if necessary, the need for more tests. Vaccines  Your health care provider may recommend certain vaccines, such as:  Influenza vaccine. This is recommended every year.  Tetanus, diphtheria, and acellular pertussis  (Tdap, Td) vaccine. You may need a Td booster every 10 years.  Zoster vaccine. You may need this after age 89.  Pneumococcal 13-valent conjugate (PCV13) vaccine. One dose is recommended after age 6.  Pneumococcal polysaccharide (PPSV23) vaccine. One dose is recommended after age 40. Talk to your health care provider about which screenings and vaccines you need and how often you need them. This information is not intended to replace advice given to you by your health care provider. Make sure you discuss any questions you have with your health care provider. Document Released: 08/02/2015 Document Revised: 03/25/2016 Document Reviewed: 05/07/2015 Elsevier Interactive Patient Education  2017 [employer] Prevention in the Home Falls can cause injuries. They can happen to people of all ages. There are many things you can do to make your home safe and to help prevent falls. What can I do on the outside of my home?  Regularly fix the edges of walkways and driveways and fix any cracks.  Remove anything that might make you trip as you walk through a door, such as a raised step or threshold.  Trim any bushes or trees on the path to your home.  Use bright outdoor lighting.  Clear any walking paths of anything that might make someone trip, such as rocks or tools.  Regularly check to see if handrails are loose or broken. Make sure that both sides of any steps have handrails.  Any raised decks and porches should have guardrails on the edges.  Have any leaves, snow, or ice cleared regularly.  Use sand or salt on walking paths during winter.  Clean up any spills in your garage right away. This includes oil or grease spills. What can I do in the bathroom?  Use night lights.  Install grab bars by the toilet and in the tub and shower. Do not use towel bars as grab bars.  Use non-skid mats or decals in the tub or shower.  If you need to sit down in the shower, use a plastic, non-slip  stool.  Keep the floor dry. Clean up any water that spills on the floor as soon as it happens.  Remove soap buildup in the tub or shower regularly.  Attach bath mats securely with double-sided non-slip rug tape.  Do not have throw rugs and other things on the floor that can make you trip. What can I do in the bedroom?  Use night lights.  Make sure that you have a light by your bed that is easy to reach.  Do not use any sheets or blankets that are too big for your bed. They should not hang down onto the floor.  Have a firm chair that has side arms. You can use this for support while you get dressed.  Do not have throw rugs and other things on the floor that can make you trip. What can I do in the kitchen?  Clean up any spills right away.  Avoid walking on wet floors.  Keep items that you use a lot in easy-to-reach places.  If you need to reach something above you, use a strong step stool that has a grab bar.  Keep electrical cords  out of the way.  Do not use floor polish or wax that makes floors slippery. If you must use wax, use non-skid floor wax.  Do not have throw rugs and other things on the floor that can make you trip. What can I do with my stairs?  Do not leave any items on the stairs.  Make sure that there are handrails on both sides of the stairs and use them. Fix handrails that are broken or loose. Make sure that handrails are as long as the stairways.  Check any carpeting to make sure that it is firmly attached to the stairs. Fix any carpet that is loose or worn.  Avoid having throw rugs at the top or bottom of the stairs. If you do have throw rugs, attach them to the floor with carpet tape.  Make sure that you have a light switch at the top of the stairs and the bottom of the stairs. If you do not have them, ask someone to add them for you. What else can I do to help prevent falls?  Wear shoes that:  Do not have high heels.  Have rubber bottoms.  Are  comfortable and fit you well.  Are closed at the toe. Do not wear sandals.  If you use a stepladder:  Make sure that it is fully opened. Do not climb a closed stepladder.  Make sure that both sides of the stepladder are locked into place.  Ask someone to hold it for you, if possible.  Clearly mark and make sure that you can see:  Any grab bars or handrails.  First and last steps.  Where the edge of each step is.  Use tools that help you move around (mobility aids) if they are needed. These include:  Canes.  Walkers.  Scooters.  Crutches.  Turn on the lights when you go into a dark area. Replace any light bulbs as soon as they burn out.  Set up your furniture so you have a clear path. Avoid moving your furniture around.  If any of your floors are uneven, fix them.  If there are any pets around you, be aware of where they are.  Review your medicines with your doctor. Some medicines can make you feel dizzy. This can increase your chance of falling. Ask your doctor what other things that you can do to help prevent falls. This information is not intended to replace advice given to you by your health care provider. Make sure you discuss any questions you have with your health care provider. Document Released: 05/02/2009 Document Revised: 12/12/2015 Document Reviewed: 08/10/2014 Elsevier Interactive Patient Education  2017 [employer].

## 2017-01-12 NOTE — Progress Notes (Signed)
Subjective:   Marc Ward is a 66 y.o. male who presents for Medicare Annual/Subsequent preventive examination.  Review of Systems: Cardiac Risk Factors include: advanced age (>75men, >32 women);hypertension;male gender;Other (see comment), Risk factor comments: cocaine use     Objective:    Vitals: BP 138/90   Pulse 72   Temp 97.8 F (36.6 C) (Oral)   Ht 6' (1.829 m)   Wt 182 lb (82.6 kg)   BMI 24.68 kg/m   Body mass index is 24.68 kg/m.  Tobacco History  Smoking Status  . Never Smoker  Smokeless Tobacco  . Never Used     Counseling given: Not Answered   Past Medical History:  Diagnosis Date  . COPD (chronic obstructive pulmonary disease) ([place])   . GERD (gastroesophageal reflux disease)   . H. pylori infection FEB 2011   ABO BID x1O DAYS  . History of kidney stones   . Hypertension 2009  . IDA (iron deficiency anemia)   . Sickle cell trait ([place])   . Substance use disorder    cocaine and marijuana  . Tubulovillous adenoma of colon 2013   currently under close surveillance by GI   Past Surgical History:  Procedure Laterality Date  . ADENOIDECTOMY    . BOWEL RESECTION  11/08/2011   On pathology had a tubulovillous adenoma of the small bowel. Procedure: SMALL BOWEL RESECTION;  Surgeon: Jamesetta So, MD;  Location: [facility];  Service: General;  Laterality: N/A;  . CIRCUMCISION    . COLONOSCOPY  AUG 2010   ADVANCED [place] ADENOMA, TICS,   . COLONOSCOPY WITH ESOPHAGOGASTRODUODENOSCOPY (EGD)  06/2011   Dr. Oneida Alar: Hiatal hernia, moderate gastritis, mild duodenitis (benign small bowel mucosa on path, mild chronic gastritis with intestinal metaplasia but no H. pylori). On colonoscopy scattered diverticulosis, internal hemorrhoids, sessile polyps in the hepatic flexure and cecum, no adenomatous changes on path.  Marland Kitchen LAPAROTOMY  11/08/2011   Procedure: EXPLORATORY LAPAROTOMY;  Surgeon: Jamesetta So, MD;  Location: [facility];  Service: General;  Laterality: N/A;  .  TONSILLECTOMY    . UPPER GASTROINTESTINAL ENDOSCOPY  FEB 2011   H. PYLORI   Family History  Problem Relation Age of Onset  . Hypertension Mother   . Diabetes Father   . Hypertension Brother   . Sickle cell trait Daughter   . Sickle cell trait Son   . Colon cancer Neg Hx    History  Sexual Activity  . Sexual activity: Yes  . Partners: Female  . Birth control/ protection: None    Comment: girlfriend    Outpatient Encounter Prescriptions as of 01/12/2017  Medication Sig  . acetaminophen (TYLENOL) 500 MG tablet Take 1,000 mg by mouth every 6 (six) hours as needed for mild pain.   Marland Kitchen amLODipine (NORVASC) 5 MG tablet TAKE 1 TABLET (5 MG TOTAL) BY MOUTH DAILY.  Marland Kitchen aspirin 81 MG tablet Take 81 mg by mouth daily.  . Fluticasone Furoate-Vilanterol (BREO ELLIPTA IN) Inhale 1 puff into the lungs as needed (shortness of breath).   . IRON PO Take 1 tablet by mouth daily.  Marland Kitchen omeprazole (PRILOSEC) 20 MG capsule TAKE 1 CAPSULE (20 MG TOTAL) BY MOUTH DAILY.  . [DISCONTINUED] albuterol (PROVENTIL HFA;VENTOLIN HFA) 108 (90 Base) MCG/ACT inhaler Inhale 2 puffs into the lungs every 6 (six) hours as needed for wheezing or shortness of breath.  . [DISCONTINUED] cholecalciferol (VITAMIN D) 1000 UNITS tablet Take 1,000 Units by mouth daily.  . [DISCONTINUED] Potassium Chloride ER 20 MEQ  TBCR 2 PO BID FOR 2 DAYS   No facility-administered encounter medications on file as of 01/12/2017.     Activities of Daily Living In your present state of health, do you have any difficulty performing the following activities: 01/12/2017 07/02/2016  Hearing? N N  Vision? N N  Difficulty concentrating or making decisions? N N  Walking or climbing stairs? N N  Dressing or bathing? N N  Doing errands, shopping? N N  Preparing Food and eating ? N -  Using the Toilet? N -  In the past six months, have you accidently leaked urine? N -  Do you have problems with loss of bowel control? N -  Managing your Medications? N -    Managing your Finances? N -  Housekeeping or managing your Housekeeping? N -  Some recent data might be hidden    Patient Care Team: Fayrene Helper, MD as PCP - [facility], Marga Melnick, MD (Gastroenterology)   Assessment:    Exercise Activities and Dietary recommendations Current Exercise Habits: Home exercise routine, Type of exercise: walking, Time (Minutes): 25, Frequency (Times/Week): 3, Weekly Exercise (Minutes/Week): 75, Intensity: Mild  Goals    . Weight (lb) < 177 lb (80.3 kg)          Patient would like to lose 5 lbs.      Fall Risk Fall Risk  01/12/2017 06/17/2016 09/24/2015 08/03/2014 02/13/2013  Falls in the past year? No No Yes No No  Number falls in past yr: - - 1 - -  Injury with Fall? - - No - -  Follow up - - Falls evaluation completed - -   Depression Screen PHQ 2/9 Scores 01/12/2017 06/17/2016 09/24/2015 08/12/2015  PHQ - 2 Score 0 0 3 3  PHQ- 9 Score - - 8 8    Cognitive Function: Normal   6CIT Screen 01/12/2017  What Year? 0 points  What month? 0 points  What time? 0 points  Count back from 20 0 points  Months in reverse 0 points  Repeat phrase 0 points  Total Score 0    Immunization History  Administered Date(s) Administered  . Influenza,inj,Quad PF,36+ Mos 03/30/2013, 06/21/2014, 07/29/2015, 06/18/2016  . Pneumococcal Conjugate-13 10/11/2014  . Pneumococcal Polysaccharide-23 11/09/2011, 12/16/2016  . Td 01/22/2010   Screening Tests Health Maintenance  Topic Date Due  . COLONOSCOPY  02/17/2017 (Originally 07/06/2014)  . INFLUENZA VACCINE  02/17/2017  . TETANUS/TDAP  01/23/2020  . Hepatitis C Screening  Completed  . PNA vac Low Risk Adult  Completed      Plan:   I have personally reviewed and noted the following in the patient's chart:   . Medical and social history . Use of alcohol, tobacco or illicit drugs  . Current medications and supplements . Functional ability and status . Nutritional status . Physical activity . Advanced  directives . List of other physicians . Hospitalizations, surgeries, and ER visits in previous 12 months . Vitals . Screenings to include cognitive, depression, and falls . Referrals and appointments: [employer] referral sent today for dental care.  In addition, I have reviewed and discussed with patient certain preventive protocols, quality metrics, and best practice recommendations. A written personalized care plan for preventive services as well as general preventive health recommendations were provided to patient.     Stormy Fabian, LPN  08/16/7865

## 2017-01-28 ENCOUNTER — Ambulatory Visit: Payer: Medicare Other

## 2017-03-30 DIAGNOSIS — J45991 Cough variant asthma: Secondary | ICD-10-CM | POA: Diagnosis not present

## 2017-03-30 DIAGNOSIS — I10 Essential (primary) hypertension: Secondary | ICD-10-CM | POA: Diagnosis not present

## 2017-04-22 ENCOUNTER — Telehealth: Payer: Self-pay | Admitting: *Deleted

## 2017-04-22 NOTE — Telephone Encounter (Signed)
Legrand Como @ [facility]  [phone] Called for medical clarification request /daignosis From last year 2017

## 2017-04-23 NOTE — Telephone Encounter (Signed)
Request form faxed re depression type for visit on 07/2015

## 2017-06-22 ENCOUNTER — Encounter: Payer: Self-pay | Admitting: Family Medicine

## 2017-06-22 ENCOUNTER — Ambulatory Visit (INDEPENDENT_AMBULATORY_CARE_PROVIDER_SITE_OTHER): Payer: Medicare Other | Admitting: Family Medicine

## 2017-06-22 VITALS — BP 124/80 | HR 75 | Resp 16 | Ht 72.0 in | Wt 183.0 lb

## 2017-06-22 DIAGNOSIS — Z23 Encounter for immunization: Secondary | ICD-10-CM | POA: Diagnosis not present

## 2017-06-22 DIAGNOSIS — Z1211 Encounter for screening for malignant neoplasm of colon: Secondary | ICD-10-CM | POA: Diagnosis not present

## 2017-06-22 DIAGNOSIS — Z125 Encounter for screening for malignant neoplasm of prostate: Secondary | ICD-10-CM

## 2017-06-22 DIAGNOSIS — I10 Essential (primary) hypertension: Secondary | ICD-10-CM

## 2017-06-22 DIAGNOSIS — Z0000 Encounter for general adult medical examination without abnormal findings: Secondary | ICD-10-CM

## 2017-06-22 DIAGNOSIS — J309 Allergic rhinitis, unspecified: Secondary | ICD-10-CM

## 2017-06-22 DIAGNOSIS — F1490 Cocaine use, unspecified, uncomplicated: Secondary | ICD-10-CM

## 2017-06-22 LAB — POC HEMOCCULT BLD/STL (OFFICE/1-CARD/DIAGNOSTIC): Fecal Occult Blood, POC: NEGATIVE

## 2017-06-22 MED ORDER — MONTELUKAST SODIUM 10 MG PO TABS: 10.0000 mg | ORAL_TABLET | Freq: Every day | ORAL | 3 refills | Status: DC

## 2017-06-22 NOTE — Assessment & Plan Note (Signed)
Counseled re the need to discontinue use due to increased risk of kidney and heart damage

## 2017-06-22 NOTE — Patient Instructions (Addendum)
F/U in 6 month, call if you need me before  Flu vaccine today  Fasting lipid, chem 7 and PSA this week please   Medication sent for allergies  Thank you  for choosing [facility]. We consider it a privelige to serve you.  Delivering excellent health care in a caring and  compassionate way is our goal.  Partnering with you,  so that together we can achieve this goal is our strategy.

## 2017-06-22 NOTE — Assessment & Plan Note (Signed)
Increased and uncontrolled symptoms for past 1 month, no symptoms or signs of infection Start daily singulair

## 2017-06-22 NOTE — Progress Notes (Signed)
   Marc Ward     MRN: [id number]      DOB: 02-20-1951   HPI: Patient is in for annual physical exam. C/o increased nasal congestion with clear drainage , which is common at this time of the year, ned mediation, denies fever or chills  Immunization is reviewed , and  updated     PE; BP 124/80   Pulse 75   Resp 16   Ht 6' (1.829 m)   Wt 183 lb (83 kg)   SpO2 93%   BMI 24.82 kg/m   Pleasant male, alert and oriented x 3, in no cardio-pulmonary distress. Afebrile. HEENT No facial trauma or asymetry. Sinuses non tender. EOMI, pupils equally reactive to light. External ears normal, tympanic membranes clear. Oropharynx moist, no exudate. Neck: supple, no adenopathy,JVD or thyromegaly.No bruits.  Chest: Clear to ascultation bilaterally.No crackles or wheezes. Non tender to palpation  Breast: No asymetry,no masses. No nipple discharge or inversion. No axillary or supraclavicular adenopathy  Cardiovascular system; Heart sounds normal,  S1 and  S2 ,no S3.  No murmur, or thrill. Apical beat not displaced Peripheral pulses normal.  Abdomen: Soft, non tender, no organomegaly or masses. No bruits. Bowel sounds normal. No guarding, tenderness or rebound.  Rectal:  Normal sphincter tone. No hemorrhoids or  masses. guaiac negative stool. Prostate smooth and firm    Musculoskeletal exam: Full ROM of spine, hips , shoulders and knees. No deformity ,swelling or crepitus noted. No muscle wasting or atrophy.   Neurologic: Cranial nerves 2 to 12 intact. Power, tone ,sensation and reflexes normal throughout. No disturbance in gait. No tremor.  Skin: Intact, no ulceration, erythema , scaling or rash noted. Pigmentation normal throughout  Psych; Normal mood and affect. Judgement and concentration normal   Assessment & Plan:  Annual physical exam Annual exam as documented. . Immunization and cancer screening needs are specifically addressed at this  visit.   Allergic rhinitis Increased and uncontrolled symptoms for past 1 month, no symptoms or signs of infection Start daily singulair

## 2017-06-22 NOTE — Assessment & Plan Note (Signed)
Annual exam as documented. . Immunization and cancer screening needs are specifically addressed at this visit.  

## 2017-06-25 DIAGNOSIS — Z125 Encounter for screening for malignant neoplasm of prostate: Secondary | ICD-10-CM | POA: Diagnosis not present

## 2017-06-25 DIAGNOSIS — I10 Essential (primary) hypertension: Secondary | ICD-10-CM | POA: Diagnosis not present

## 2017-06-25 LAB — BASIC METABOLIC PANEL
BUN: 18 mg/dL (ref 7–25)
CALCIUM: 8.5 mg/dL — AB (ref 8.6–10.3)
CO2: 30 mmol/L (ref 20–32)
Chloride: 110 mmol/L (ref 98–110)
Creat: 1.23 mg/dL (ref 0.70–1.25)
Glucose, Bld: 106 mg/dL — ABNORMAL HIGH (ref 65–99)
Potassium: 3.7 mmol/L (ref 3.5–5.3)
Sodium: 147 mmol/L — ABNORMAL HIGH (ref 135–146)

## 2017-06-25 LAB — PSA: PSA: 0.5 ng/mL (ref <=4.0)

## 2017-06-25 LAB — LIPID PANEL
CHOL/HDL RATIO: 2.7 (calc) (ref <5.0)
Cholesterol: 123 mg/dL (ref <200)
HDL: 45 mg/dL (ref >40)
LDL CHOLESTEROL (CALC): 61 mg/dL
Non-HDL Cholesterol (Calc): 78 mg/dL (calc) (ref <130)
Triglycerides: 90 mg/dL (ref <150)

## 2017-07-14 ENCOUNTER — Other Ambulatory Visit: Payer: Self-pay | Admitting: Family Medicine

## 2017-07-14 NOTE — Telephone Encounter (Signed)
Seen 12 4 18 

## 2017-12-21 ENCOUNTER — Ambulatory Visit (INDEPENDENT_AMBULATORY_CARE_PROVIDER_SITE_OTHER): Payer: Medicare Other | Admitting: Family Medicine

## 2017-12-21 ENCOUNTER — Encounter: Payer: Self-pay | Admitting: Family Medicine

## 2017-12-21 VITALS — BP 118/80 | HR 81 | Temp 97.9°F | Resp 16 | Ht 72.0 in | Wt 182.0 lb

## 2017-12-21 DIAGNOSIS — D126 Benign neoplasm of colon, unspecified: Secondary | ICD-10-CM | POA: Diagnosis not present

## 2017-12-21 DIAGNOSIS — F1490 Cocaine use, unspecified, uncomplicated: Secondary | ICD-10-CM

## 2017-12-21 DIAGNOSIS — I10 Essential (primary) hypertension: Secondary | ICD-10-CM | POA: Diagnosis not present

## 2017-12-21 MED ORDER — BENZONATATE 100 MG PO CAPS: 100.0000 mg | ORAL_CAPSULE | Freq: Two times a day (BID) | ORAL | 0 refills | Status: DC | PRN

## 2017-12-21 MED ORDER — PREDNISONE 5 MG PO TABS: ORAL_TABLET | ORAL | 0 refills | Status: DC

## 2017-12-21 NOTE — Patient Instructions (Addendum)
Wellness with nurse early September, call if you need me sooner  Physical exam with MD 12/15 or shortly after , call if you need me sooner  Pls ask l for fasting labs due Dec 7 or after at wellness visit, cBC, pSA, lipid,chem 7 and EGFR , TSH   Check your pharmacy for the Shingles vacines you need these  Please arrange and get your colonoscopy, this is past due.  Prednison e and tessalon prles are sent for chest congestion  It is important that you exercise regularly at least 30 minutes 5 times a week. If you develop chest pain, have severe difficulty breathing, or feel very tired, stop exercising immediately and seek medical attention

## 2017-12-25 NOTE — Progress Notes (Signed)
   Marc Ward     MRN: [id number]      DOB: 1950-10-05   HPI [name] is here for follow up and re-evaluation of chronic medical conditions, medication management and review of any available recent lab and radiology data.  Preventive health is updated, specifically  Cancer screening and Immunization.  He is to get his shingles vaccine and still needs his colonoscopy, is still using cocaine and says he will prepare by getting clean fr the procedure The PT denies any adverse reactions to current medications since the last visit.    ROS Denies recent fever or chills. Denies sinus pressure, nasal congestion, ear pain or sore throat. Denies chest congestion,  C/o  productive coug with sputum x 5 days, denies  wheezing. Denies chest pains, palpitations and leg swelling Denies abdominal pain, nausea, vomiting,diarrhea or constipation.   Denies dysuria, frequency, hesitancy or incontinence. Denies joint pain, swelling and limitation in mobility. Denies headaches, seizures, numbness, or tingling. Denies depression, anxiety or insomnia. Denies skin break down or rash.   PE  BP 118/80   Pulse 81   Temp 97.9 F (36.6 C) (Oral)   Resp 16   Ht 6' (1.829 m)   Wt 182 lb (82.6 kg)   SpO2 97%   BMI 24.68 kg/m   Patient alert and oriented and in no cardiopulmonary distress.  HEENT: No facial asymmetry, EOMI,   oropharynx pink and moist.  Neck supple no JVD, no mass.  Chest: decreased though adequate air entry, bibasilar crackles  CVS: S1, S2 no murmurs, no S3.Regular rate.  ABD: Soft non tender.   Ext: No edema  MS: Adequate ROM spine, shoulders, hips and knees.  Skin: Intact, no ulcerations or rash noted.  Psych: Good eye contact, normal affect. Memory intact not anxious or depressed appearing.  CNS: CN 2-12 intact, power,  normal throughout.no focal deficits noted.   Assessment & Plan  Essential hypertension Controlled, no change in medication DASH diet and commitment to  daily physical activity for a minimum of 30 minutes discussed and encouraged, as a part of hypertension management. The importance of attaining a healthy weight is also discussed.  BP/Weight 12/21/2017 06/22/2017 01/12/2017 12/16/2016 07/02/2016 06/23/2016 68/06/7516  Systolic BP 001 749 449 675 916 384 665  Diastolic BP 80 80 90 76 92 92 76  Wt. (Lbs) 182 183 182 183 179 178.2 181.08  BMI 24.68 24.82 24.68 24.82 24.28 24.17 24.56       Cocaine use ongoing use reported, potential harm to his health is stressed , he is actually unable to have his colonoscopy which he needs ,because of this, states he will get clean for the procedure  TUBULOVILLOUS ADENOMA, COLON Repeat colonoscopy past due will refer to GI , pt very aware of his need to be cocaine free for the procedure, he has had to have the procedure

## 2017-12-25 NOTE — Assessment & Plan Note (Signed)
Controlled, no change in medication DASH diet and commitment to daily physical activity for a minimum of 30 minutes discussed and encouraged, as a part of hypertension management. The importance of attaining a healthy weight is also discussed.  BP/Weight 12/21/2017 06/22/2017 01/12/2017 12/16/2016 07/02/2016 06/23/2016 79/08/4095  Systolic BP 353 299 242 683 419 622 297  Diastolic BP 80 80 90 76 92 92 76  Wt. (Lbs) 182 183 182 183 179 178.2 181.08  BMI 24.68 24.82 24.68 24.82 24.28 24.17 24.56

## 2017-12-25 NOTE — Assessment & Plan Note (Signed)
ongoing use reported, potential harm to his health is stressed , he is actually unable to have his colonoscopy which he needs ,because of this, states he will get clean for the procedure

## 2017-12-25 NOTE — Assessment & Plan Note (Signed)
Repeat colonoscopy past due will refer to GI , pt very aware of his need to be cocaine free for the procedure, he has had to have the procedure

## 2017-12-26 ENCOUNTER — Other Ambulatory Visit: Payer: Self-pay | Admitting: Family Medicine

## 2017-12-27 ENCOUNTER — Encounter: Payer: Self-pay | Admitting: Gastroenterology

## 2018-03-23 ENCOUNTER — Encounter: Payer: Self-pay | Admitting: Gastroenterology

## 2018-03-23 ENCOUNTER — Ambulatory Visit (INDEPENDENT_AMBULATORY_CARE_PROVIDER_SITE_OTHER): Payer: Medicare Other | Admitting: Gastroenterology

## 2018-03-23 ENCOUNTER — Other Ambulatory Visit: Payer: Self-pay

## 2018-03-23 VITALS — BP 114/77 | HR 69 | Temp 97.1°F | Ht 72.0 in | Wt 181.4 lb

## 2018-03-23 DIAGNOSIS — D126 Benign neoplasm of colon, unspecified: Secondary | ICD-10-CM | POA: Diagnosis not present

## 2018-03-23 DIAGNOSIS — F1490 Cocaine use, unspecified, uncomplicated: Secondary | ICD-10-CM | POA: Diagnosis not present

## 2018-03-23 MED ORDER — CLENPIQ 10-3.5-12 MG-GM -GM/160ML PO SOLN: 1.0000 | Freq: Once | ORAL | 0 refills | Status: AC

## 2018-03-23 NOTE — Progress Notes (Signed)
cc'ed to pcp °

## 2018-03-23 NOTE — Assessment & Plan Note (Addendum)
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 F/u colonoscopy in 2012 with no evidence of new or persisting adenomatous polyps.  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  He past due for his surveillance colonoscopy, procedure canceled 2 years ago due to positive drug screen for cocaine.  Discussed at length with patient today.  He has to be drug-free for 30 days prior to being able to receive propofol for sedation given increased risk of cardiac arrest.  We will schedule his procedure out about 6 weeks.  Patient plans to see Dr. Moshe Cipro on Monday for already scheduled appointment and discuss goal of quitting cocaine prior to procedure.  I have discussed the risks, alternatives, benefits with regards to but not limited to the risk of reaction to medication, bleeding, infection, perforation and the patient is agreeable to proceed. Written consent to be obtained.

## 2018-03-23 NOTE — Progress Notes (Addendum)
Primary Care Physician:  Fayrene Helper, MD  Primary Gastroenterologist:  Barney Drain, MD  REVIEWED-NO ADDITIONAL RECOMMENDATIONS.  Chief Complaint  Patient presents with  . benign neoplasm    no concerns    HPI:  Marc Ward is a 67 y.o. male here to schedule colonoscopy.  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  His 2012 colonoscopy showed no recurrent polyps.  We saw him back in 2017, scheduled him for a colonoscopy but he was canceled due to a positive drug screen for cocaine.  He was advised to call us after 30 days of being drug-free.  Unfortunately this never happened.  From a GI standpoint, he denies any constipation, diarrhea, melena, rectal bleeding.  Denies any heartburn, dysphagia, vomiting, unintentional weight loss.  He smokes crack daily.  He is not sure if he can go 30 days without using and plans to talk with Dr. Moshe Cipro at his appointment on Monday.  We discussed rationale behind avoiding cocaine not only for overall good health reasons but cannot utilize propofol for sedation with cocaine as it can increase risk for cardiac arrest.  Current Outpatient Medications  Medication Sig Dispense Refill  . acetaminophen (TYLENOL) 500 MG tablet Take 1,000 mg by mouth every 6 (six) hours as needed for mild pain.     Marland Kitchen amLODipine (NORVASC) 5 MG tablet TAKE 1 TABLET (5 MG TOTAL) BY MOUTH DAILY. 90 tablet 1  . aspirin 81 MG tablet Take 81 mg by mouth daily.    . cholecalciferol (VITAMIN D) 1000 units tablet Take 1,000 Units by mouth daily.    . Fluticasone Furoate-Vilanterol (BREO ELLIPTA IN) Inhale 1 puff into the lungs as needed (shortness of breath).     Marland Kitchen omeprazole (PRILOSEC) 20 MG capsule TAKE 1 CAPSULE (20 MG TOTAL) BY MOUTH DAILY. 90 capsule 1  . CLENPIQ 10-3.5-12 MG-GM -GM/160ML SOLN Take 1 kit by mouth once for 1 dose. 1 Bottle 0   No current facility-administered  medications for this visit.     Allergies as of 03/23/2018  . (No Known Allergies)    Past Medical History:  Diagnosis Date  . COPD (chronic obstructive pulmonary disease) ([place])   . GERD (gastroesophageal reflux disease)   . H. pylori infection FEB 2011   ABO BID x1O DAYS  . History of kidney stones   . Hypertension 2009  . IDA (iron deficiency anemia)   . Sickle cell trait ([place])   . Substance use disorder    cocaine and marijuana  . Tubulovillous adenoma of colon 2013   currently under close surveillance by GI    Past Surgical History:  Procedure Laterality Date  . ADENOIDECTOMY    . BOWEL RESECTION  11/08/2011   On pathology had a tubulovillous adenoma of the small bowel. Procedure: SMALL BOWEL RESECTION;  Surgeon: Jamesetta So, MD;  Location: [facility];  Service: General;  Laterality: N/A;  . CIRCUMCISION    . COLONOSCOPY  AUG 2010   ADVANCED [place] ADENOMA, TICS,   . COLONOSCOPY WITH ESOPHAGOGASTRODUODENOSCOPY (EGD)  06/2011   Dr. Oneida Alar: Hiatal hernia, moderate gastritis, mild duodenitis (benign small bowel mucosa on path, mild chronic gastritis with intestinal metaplasia but no H. pylori). On colonoscopy scattered diverticulosis, internal hemorrhoids, sessile polyps in the hepatic flexure and cecum, no adenomatous changes on path.  Marland Kitchen LAPAROTOMY  11/08/2011   Procedure: EXPLORATORY LAPAROTOMY;  Surgeon: Jamesetta So, MD;  Location: [facility];  Service: General;  Laterality: N/A;  . TONSILLECTOMY    . UPPER GASTROINTESTINAL ENDOSCOPY  FEB 2011   H. PYLORI    Family History  Problem Relation Age of Onset  . Hypertension Mother   . Diabetes Father   . Hypertension Brother   . Sickle cell trait Daughter   . Sickle cell trait Son   . Colon cancer Neg Hx     Social History   Socioeconomic History  . Marital status: Divorced    Spouse name: Not on file  . Number of children: 3  . Years of education: Not on file  . Highest education level: Not on file  Occupational  History  . Occupation: Unemployed     Employer: [employer]  . Financial resource strain: Not on file  . Food insecurity:    Worry: Not on file    Inability: Not on file  . Transportation needs:    Medical: Not on file    Non-medical: Not on file  Tobacco Use  . Smoking status: Never Smoker  . Smokeless tobacco: Never Used  Substance and Sexual Activity  . Alcohol use: Yes    Comment: occasional drinker-2 beers/wk  . Drug use: Yes    Frequency: 1.0 times per week    Types: Marijuana, Cocaine    Comment: cocaine daily (03/23/18)  . Sexual activity: Yes    Partners: Female    Birth control/protection: None    Comment: girlfriend  Lifestyle  . Physical activity:    Days per week: Not on file    Minutes per session: Not on file  . Stress: Not on file  Relationships  . Social connections:    Talks on phone: Not on file    Gets together: Not on file    Attends religious service: Not on file    Active member of club or organization: Not on file    Attends meetings of clubs or organizations: Not on file    Relationship status: Not on file  . Intimate partner violence:    Fear of current or ex partner: Not on file    Emotionally abused: Not on file    Physically abused: Not on file    Forced sexual activity: Not on file  Other Topics Concern  . Not on file  Social History Narrative  . Not on file      ROS:  General: Negative for anorexia, weight loss, fever, chills, fatigue, weakness. Eyes: Negative for vision changes.  ENT: Negative for hoarseness, difficulty swallowing , nasal congestion. CV: Negative for chest pain, angina, palpitations, dyspnea on exertion, peripheral edema.  Respiratory: Negative for dyspnea at rest, dyspnea on exertion, cough, sputum, wheezing.  GI: See history of present illness. GU:  Negative for dysuria, hematuria, urinary incontinence, urinary frequency, nocturnal urination.  MS: Negative for joint pain, low back pain.   Derm: Negative for rash or itching.  Neuro: Negative for weakness, abnormal sensation, seizure, frequent headaches, memory loss, confusion.  Psych: Negative for anxiety, depression, suicidal ideation, hallucinations.  Endo: Negative for unusual weight change.  Heme: Negative for bruising or bleeding. Allergy: Negative for rash or hives.    Physical Examination:  BP 114/77   Pulse 69   Temp (!) 97.1 F (36.2 C) (Oral)   Ht 6' (1.829 m)   Wt 181 lb 6.4 oz (82.3 kg)   BMI 24.60 kg/m    General: Well-nourished, well-developed in no acute distress.  Head:  Normocephalic, atraumatic.   Eyes: Conjunctiva pink, no icterus. Mouth: Oropharyngeal mucosa moist and pink , no lesions erythema or exudate. Neck: Supple without thyromegaly, masses, or lymphadenopathy.  Lungs: Clear to auscultation bilaterally.  Heart: Regular rate and rhythm, no murmurs rubs or gallops.  Abdomen: Bowel sounds are normal, nontender, nondistended, no hepatosplenomegaly or masses, no abdominal bruits or    hernia , no rebound or guarding.   Rectal: Not performed Extremities: No lower extremity edema. No clubbing or deformities.  Neuro: Alert and oriented x 4 , grossly normal neurologically.  Skin: Warm and dry, no rash or jaundice.   Psych: Alert and cooperative, normal mood and affect.

## 2018-03-23 NOTE — Patient Instructions (Signed)
LMOVM for endo scheduler, pt needs UDS with pre-op labs.

## 2018-03-23 NOTE — Patient Instructions (Signed)
1. Colonoscopy in the near future (you will have to be cocaine free for four weeks and have a negative drug screen). I will let Dr. Moshe Cipro know we have had this discussion so she can provide input for withdrawal concerns.

## 2018-03-24 ENCOUNTER — Telehealth: Payer: Self-pay

## 2018-03-24 NOTE — Telephone Encounter (Signed)
Called and informed pt of pre-op appt 05/10/18 at 11:00am. Letter mailed.

## 2018-03-28 ENCOUNTER — Ambulatory Visit (INDEPENDENT_AMBULATORY_CARE_PROVIDER_SITE_OTHER): Payer: Medicare Other

## 2018-03-28 VITALS — BP 140/90 | HR 81 | Resp 16 | Ht 72.0 in | Wt 179.0 lb

## 2018-03-28 DIAGNOSIS — Z23 Encounter for immunization: Secondary | ICD-10-CM | POA: Diagnosis not present

## 2018-03-28 DIAGNOSIS — Z0000 Encounter for general adult medical examination without abnormal findings: Secondary | ICD-10-CM | POA: Diagnosis not present

## 2018-03-28 NOTE — Patient Instructions (Addendum)
Marc Ward , Thank you for taking time to come for your Medicare Wellness Visit. I appreciate your ongoing commitment to your health goals. Please review the following plan we discussed and let me know if I can assist you in the future.    Schedule your next AWV in 1 year  Return to [facility] the end of October to get your 2nd shingrix vaccine Flu vaccine given today      Screening recommendations/referrals: Colonoscopy: scheduled  Recommended yearly ophthalmology/optometry visit for glaucoma screening and checkup Recommended yearly dental visit for hygiene and checkup  Vaccinations: Influenza vaccine: given today  Pneumococcal vaccine: up to date  Tdap vaccine: up to date  Shingles vaccine: ask insurance if covered   Advanced directives: form given   Conditions/risks identified: done   Next appointment: scheduled   Preventive Care 69 Years and Older, Male Preventive care refers to lifestyle choices and visits with your health care provider that can promote health and wellness. What does preventive care include?  A yearly physical exam. This is also called an annual well check.  Dental exams once or twice a year.  Routine eye exams. Ask your health care provider how often you should have your eyes checked.  Personal lifestyle choices, including:  Daily care of your teeth and gums.  Regular physical activity.  Eating a healthy diet.  Avoiding tobacco and drug use.  Limiting alcohol use.  Practicing safe sex.  Taking low doses of aspirin every day.  Taking vitamin and mineral supplements as recommended by your health care provider. What happens during an annual well check? The services and screenings done by your health care provider during your annual well check will depend on your age, overall health, lifestyle risk factors, and family history of disease. Counseling  Your health care provider may ask you questions about your:  Alcohol use.  Tobacco use.  Drug  use.  Emotional well-being.  Home and relationship well-being.  Sexual activity.  Eating habits.  History of falls.  Memory and ability to understand (cognition).  Work and work Statistician. Screening  You may have the following tests or measurements:  Height, weight, and BMI.  Blood pressure.  Lipid and cholesterol levels. These may be checked every 5 years, or more frequently if you are over 62 years old.  Skin check.  Lung cancer screening. You may have this screening every year starting at age 44 if you have a 30-pack-year history of smoking and currently smoke or have quit within the past 15 years.  Fecal occult blood test (FOBT) of the stool. You may have this test every year starting at age 51.  Flexible sigmoidoscopy or colonoscopy. You may have a sigmoidoscopy every 5 years or a colonoscopy every 10 years starting at age 85.  Prostate cancer screening. Recommendations will vary depending on your family history and other risks.  Hepatitis C blood test.  Hepatitis B blood test.  Sexually transmitted disease (STD) testing.  Diabetes screening. This is done by checking your blood sugar (glucose) after you have not eaten for a while (fasting). You may have this done every 1-3 years.  Abdominal aortic aneurysm (AAA) screening. You may need this if you are a current or former smoker.  Osteoporosis. You may be screened starting at age 27 if you are at high risk. Talk with your health care provider about your test results, treatment options, and if necessary, the need for more tests. Vaccines  Your health care provider may recommend certain vaccines,  such as:  Influenza vaccine. This is recommended every year.  Tetanus, diphtheria, and acellular pertussis (Tdap, Td) vaccine. You may need a Td booster every 10 years.  Zoster vaccine. You may need this after age 54.  Pneumococcal 13-valent conjugate (PCV13) vaccine. One dose is recommended after age  58.  Pneumococcal polysaccharide (PPSV23) vaccine. One dose is recommended after age 57. Talk to your health care provider about which screenings and vaccines you need and how often you need them. This information is not intended to replace advice given to you by your health care provider. Make sure you discuss any questions you have with your health care provider. Document Released: 08/02/2015 Document Revised: 03/25/2016 Document Reviewed: 05/07/2015 Elsevier Interactive Patient Education  2017 [employer] Prevention in the Home Falls can cause injuries. They can happen to people of all ages. There are many things you can do to make your home safe and to help prevent falls. What can I do on the outside of my home?  Regularly fix the edges of walkways and driveways and fix any cracks.  Remove anything that might make you trip as you walk through a door, such as a raised step or threshold.  Trim any bushes or trees on the path to your home.  Use bright outdoor lighting.  Clear any walking paths of anything that might make someone trip, such as rocks or tools.  Regularly check to see if handrails are loose or broken. Make sure that both sides of any steps have handrails.  Any raised decks and porches should have guardrails on the edges.  Have any leaves, snow, or ice cleared regularly.  Use sand or salt on walking paths during winter.  Clean up any spills in your garage right away. This includes oil or grease spills. What can I do in the bathroom?  Use night lights.  Install grab bars by the toilet and in the tub and shower. Do not use towel bars as grab bars.  Use non-skid mats or decals in the tub or shower.  If you need to sit down in the shower, use a plastic, non-slip stool.  Keep the floor dry. Clean up any water that spills on the floor as soon as it happens.  Remove soap buildup in the tub or shower regularly.  Attach bath mats securely with double-sided  non-slip rug tape.  Do not have throw rugs and other things on the floor that can make you trip. What can I do in the bedroom?  Use night lights.  Make sure that you have a light by your bed that is easy to reach.  Do not use any sheets or blankets that are too big for your bed. They should not hang down onto the floor.  Have a firm chair that has side arms. You can use this for support while you get dressed.  Do not have throw rugs and other things on the floor that can make you trip. What can I do in the kitchen?  Clean up any spills right away.  Avoid walking on wet floors.  Keep items that you use a lot in easy-to-reach places.  If you need to reach something above you, use a strong step stool that has a grab bar.  Keep electrical cords out of the way.  Do not use floor polish or wax that makes floors slippery. If you must use wax, use non-skid floor wax.  Do not have throw rugs and other things on  the floor that can make you trip. What can I do with my stairs?  Do not leave any items on the stairs.  Make sure that there are handrails on both sides of the stairs and use them. Fix handrails that are broken or loose. Make sure that handrails are as long as the stairways.  Check any carpeting to make sure that it is firmly attached to the stairs. Fix any carpet that is loose or worn.  Avoid having throw rugs at the top or bottom of the stairs. If you do have throw rugs, attach them to the floor with carpet tape.  Make sure that you have a light switch at the top of the stairs and the bottom of the stairs. If you do not have them, ask someone to add them for you. What else can I do to help prevent falls?  Wear shoes that:  Do not have high heels.  Have rubber bottoms.  Are comfortable and fit you well.  Are closed at the toe. Do not wear sandals.  If you use a stepladder:  Make sure that it is fully opened. Do not climb a closed stepladder.  Make sure that both  sides of the stepladder are locked into place.  Ask someone to hold it for you, if possible.  Clearly mark and make sure that you can see:  Any grab bars or handrails.  First and last steps.  Where the edge of each step is.  Use tools that help you move around (mobility aids) if they are needed. These include:  Canes.  Walkers.  Scooters.  Crutches.  Turn on the lights when you go into a dark area. Replace any light bulbs as soon as they burn out.  Set up your furniture so you have a clear path. Avoid moving your furniture around.  If any of your floors are uneven, fix them.  If there are any pets around you, be aware of where they are.  Review your medicines with your doctor. Some medicines can make you feel dizzy. This can increase your chance of falling. Ask your doctor what other things that you can do to help prevent falls. This information is not intended to replace advice given to you by your health care provider. Make sure you discuss any questions you have with your health care provider. Document Released: 05/02/2009 Document Revised: 12/12/2015 Document Reviewed: 08/10/2014 Elsevier Interactive Patient Education  2017 [employer].

## 2018-03-28 NOTE — Progress Notes (Signed)
Subjective:   Marc Ward is a 67 y.o. male who presents for Medicare Annual/Subsequent preventive examination.  Review of Systems:   Cardiac Risk Factors include: advanced age (>96men, >53 women);hypertension;sedentary lifestyle     Objective:    Vitals: BP 140/90   Pulse 81   Resp 16   Ht 6' (1.829 m)   Wt 179 lb (81.2 kg)   SpO2 95%   BMI 24.28 kg/m   Body mass index is 24.28 kg/m.  Advanced Directives 03/28/2018 01/12/2017 07/02/2016 07/31/2015 07/30/2015 11/08/2011 11/08/2011  Does Patient Have a Medical Advance Directive? No No No No No Patient does not have advance directive Patient does not have advance directive  Would patient like information on creating a medical advance directive? Yes (ED - Information included in AVS) Yes (MAU/Ambulatory/Procedural Areas - Information given) No - Patient declined No - patient declined information No - patient declined information - -  Pre-existing out of facility DNR order (yellow form or pink MOST form) - - - - - No No    Tobacco Social History   Tobacco Use  Smoking Status Never Smoker  Smokeless Tobacco Never Used     Counseling given: Not Answered   Clinical Intake:  Pre-visit preparation completed: Yes  Pain : No/denies pain Pain Score: 0-No pain     Nutritional Status: BMI of 19-24  Normal Diabetes: No  How often do you need to have someone help you when you read instructions, pamphlets, or other written materials from your doctor or pharmacy?: 1 - Never What is the last grade level you completed in school?: college   Interpreter Needed?: No  Information entered by :: Marquavious Nazar LPN   Past Medical History:  Diagnosis Date  . COPD (chronic obstructive pulmonary disease) ([place])   . GERD (gastroesophageal reflux disease)   . H. pylori infection FEB 2011   ABO BID x1O DAYS  . History of kidney stones   . Hypertension 2009  . IDA (iron deficiency anemia)   . Sickle cell trait ([place])   . Substance use disorder     cocaine and marijuana  . Tubulovillous adenoma of colon 2013   currently under close surveillance by GI   Past Surgical History:  Procedure Laterality Date  . ADENOIDECTOMY    . BOWEL RESECTION  11/08/2011   On pathology had a tubulovillous adenoma of the small bowel. Procedure: SMALL BOWEL RESECTION;  Surgeon: Jamesetta So, MD;  Location: [facility];  Service: General;  Laterality: N/A;  . CIRCUMCISION    . COLONOSCOPY  AUG 2010   ADVANCED [place] ADENOMA, TICS,   . COLONOSCOPY WITH ESOPHAGOGASTRODUODENOSCOPY (EGD)  06/2011   Dr. Oneida Alar: Hiatal hernia, moderate gastritis, mild duodenitis (benign small bowel mucosa on path, mild chronic gastritis with intestinal metaplasia but no H. pylori). On colonoscopy scattered diverticulosis, internal hemorrhoids, sessile polyps in the hepatic flexure and cecum, no adenomatous changes on path.  Marland Kitchen LAPAROTOMY  11/08/2011   Procedure: EXPLORATORY LAPAROTOMY;  Surgeon: Jamesetta So, MD;  Location: [facility];  Service: General;  Laterality: N/A;  . TONSILLECTOMY    . UPPER GASTROINTESTINAL ENDOSCOPY  FEB 2011   H. PYLORI   Family History  Problem Relation Age of Onset  . Hypertension Mother   . Diabetes Father   . Hypertension Brother   . Sickle cell trait Daughter   . Sickle cell trait Son   . Colon cancer Neg Hx    Social History   Socioeconomic History  .  Marital status: Divorced    Spouse name: Not on file  . Number of children: 3  . Years of education: college  . Highest education level: Some college, no degree  Occupational History  . Occupation: Unemployed     Employer: [employer]  . Financial resource strain: Not hard at all  . Food insecurity:    Worry: Never true    Inability: Never true  . Transportation needs:    Medical: No    Non-medical: No  Tobacco Use  . Smoking status: Never Smoker  . Smokeless tobacco: Never Used  Substance and Sexual Activity  . Alcohol use: Yes    Comment: occasional  drinker-2 beers/wk  . Drug use: Yes    Frequency: 1.0 times per week    Types: Marijuana, Cocaine    Comment: cocaine daily (03/23/18)  . Sexual activity: Yes    Partners: Female    Birth control/protection: None    Comment: girlfriend  Lifestyle  . Physical activity:    Days per week: 0 days    Minutes per session: 0 min  . Stress: Not at all  Relationships  . Social connections:    Talks on phone: More than three times a week    Gets together: More than three times a week    Attends religious service: More than 4 times per year    Active member of club or organization: Yes    Attends meetings of clubs or organizations: Never    Relationship status: Divorced  Other Topics Concern  . Not on file  Social History Narrative  . Not on file    Outpatient Encounter Medications as of 03/28/2018  Medication Sig  . acetaminophen (TYLENOL) 500 MG tablet Take 1,000 mg by mouth every 6 (six) hours as needed for mild pain.   Marland Kitchen amLODipine (NORVASC) 5 MG tablet TAKE 1 TABLET (5 MG TOTAL) BY MOUTH DAILY.  Marland Kitchen aspirin 81 MG tablet Take 81 mg by mouth daily.  . cholecalciferol (VITAMIN D) 1000 units tablet Take 1,000 Units by mouth daily.  . Fluticasone Furoate-Vilanterol (BREO ELLIPTA IN) Inhale 1 puff into the lungs as needed (shortness of breath).   Marland Kitchen omeprazole (PRILOSEC) 20 MG capsule TAKE 1 CAPSULE (20 MG TOTAL) BY MOUTH DAILY.   No facility-administered encounter medications on file as of 03/28/2018.     Activities of Daily Living In your present state of health, do you have any difficulty performing the following activities: 03/28/2018  Hearing? Y  Comment hearing is declining bilaterally   Vision? N  Difficulty concentrating or making decisions? N  Walking or climbing stairs? N  Dressing or bathing? N  Doing errands, shopping? N  Preparing Food and eating ? N  Using the Toilet? N  In the past six months, have you accidently leaked urine? N  Do you have problems with loss of bowel  control? N  Managing your Medications? N  Managing your Finances? N  Housekeeping or managing your Housekeeping? N  Some recent data might be hidden    Patient Care Team: Fayrene Helper, MD as PCP - General Danie Binder, MD (Gastroenterology)   Assessment:   This is a routine wellness examination for Daire.  Exercise Activities and Dietary recommendations Current Exercise Habits: The patient does not participate in regular exercise at present, Exercise limited by: respiratory conditions(s)  Goals    . Weight (lb) < 177 lb (80.3 kg)     Patient would like to  lose 5 lbs.       Fall Risk Fall Risk  03/28/2018 12/21/2017 01/12/2017 06/17/2016 09/24/2015  Falls in the past year? No No No No Yes  Number falls in past yr: - - - - 1  Injury with Fall? - - - - No  Follow up - - - - Falls evaluation completed   Is the patient's home free of loose throw rugs in walkways, pet beds, electrical cords, etc?   yes      Grab bars in the bathroom? yes      Handrails on the stairs?   yes      Adequate lighting?   yes  Timed Get Up and Go Performed:   Depression Screen PHQ 2/9 Scores 03/28/2018 12/21/2017 01/12/2017 06/17/2016  PHQ - 2 Score 4 3 0 0  PHQ- 9 Score 13 9 - -    Cognitive Function     6CIT Screen 03/28/2018 01/12/2017  What Year? 0 points 0 points  What month? 0 points 0 points  What time? 0 points 0 points  Count back from 20 0 points 0 points  Months in reverse 0 points 0 points  Repeat phrase 0 points 0 points  Total Score 0 0    Immunization History  Administered Date(s) Administered  . Influenza,inj,Quad PF,6+ Mos 03/30/2013, 06/21/2014, 07/29/2015, 06/18/2016, 06/22/2017  . Pneumococcal Conjugate-13 10/11/2014  . Pneumococcal Polysaccharide-23 11/09/2011, 12/16/2016  . Td 01/22/2010    Qualifies for Shingles Vaccine? Got 1st shot at [facility] in July. Will return to them end of next month.  Screening Tests Health Maintenance  Topic Date Due  . COLONOSCOPY   07/06/2014  . INFLUENZA VACCINE  02/17/2018  . TETANUS/TDAP  01/23/2020  . Hepatitis C Screening  Completed  . PNA vac Low Risk Adult  Completed   Cancer Screenings: Lung: Low Dose CT Chest recommended if Age 97-80 years, 30 pack-year currently smoking OR have quit w/in 15years. Patient does not qualify. Colorectal: due- states its scheduled with dr Oneida Alar Oct 22  Additional Screenings:  Hepatitis C Screening:      Plan:     I have personally reviewed and noted the following in the patient's chart:   . Medical and social history . Use of alcohol, tobacco or illicit drugs  . Current medications and supplements . Functional ability and status . Nutritional status . Physical activity . Advanced directives . List of other physicians . Hospitalizations, surgeries, and ER visits in previous 12 months . Vitals . Screenings to include cognitive, depression, and falls . Referrals and appointments  In addition, I have reviewed and discussed with patient certain preventive protocols, quality metrics, and best practice recommendations. A written personalized care plan for preventive services as well as general preventive health recommendations were provided to patient.     Kate Sable, LPN, LPN  7/0/6237

## 2018-03-31 DIAGNOSIS — J45991 Cough variant asthma: Secondary | ICD-10-CM | POA: Diagnosis not present

## 2018-03-31 DIAGNOSIS — I10 Essential (primary) hypertension: Secondary | ICD-10-CM | POA: Diagnosis not present

## 2018-04-27 ENCOUNTER — Telehealth: Payer: Self-pay | Admitting: Gastroenterology

## 2018-04-27 NOTE — Telephone Encounter (Signed)
[phone]  Patient called to reschedule his procedure

## 2018-04-27 NOTE — Telephone Encounter (Signed)
Called pt, TCS w/Propofol w/SLF rescheduled to 07/05/18 at 10:00am. LMOVM to inform endo scheduler. Also reminded endo scheduler he will need UDS with pre-op labs. Will mail new instructions after pre-op is scheduled.

## 2018-04-28 NOTE — Telephone Encounter (Signed)
Tried to call pt to inform of pre-op appt 06/29/18 at 11:00am, no answer, LMOVM. Appt letter mailed with new procedure instructions.

## 2018-05-10 ENCOUNTER — Other Ambulatory Visit (HOSPITAL_COMMUNITY): Payer: Medicare Other

## 2018-06-21 ENCOUNTER — Telehealth: Payer: Self-pay | Admitting: Gastroenterology

## 2018-06-21 NOTE — Telephone Encounter (Signed)
Spoke with patient. He reports he needs to r/s his TCS W/ MAC. He has already r/s'd once before this. He reports "I slipped up is all I can tell you". I advised patient he will need OV to r/s. He is scheduled to come back in 09/09/18 at 9:30am. Sherron Monday in Endo and LMOVM advising to cancel procedure. FYI to LSL

## 2018-06-21 NOTE — Telephone Encounter (Signed)
Fithian, HE WANTS TO RESCHEDULE HIS PROCEDURE

## 2018-06-22 NOTE — Telephone Encounter (Signed)
Patient previously advised he had to be drug free (cocaine) 30 days prior to TCS due to risk of receiving anesthesia in this setting.   Ov as planned.

## 2018-06-27 ENCOUNTER — Other Ambulatory Visit: Payer: Self-pay | Admitting: Family Medicine

## 2018-06-29 ENCOUNTER — Inpatient Hospital Stay (HOSPITAL_COMMUNITY): Admission: RE | Admit: 2018-06-29 | Payer: Medicare Other | Source: Ambulatory Visit

## 2018-07-05 ENCOUNTER — Encounter (HOSPITAL_COMMUNITY): Payer: Self-pay

## 2018-07-05 ENCOUNTER — Ambulatory Visit (HOSPITAL_COMMUNITY): Admit: 2018-07-05 | Payer: Medicare Other | Admitting: Gastroenterology

## 2018-07-05 SURGERY — COLONOSCOPY WITH PROPOFOL
Anesthesia: Monitor Anesthesia Care

## 2018-07-06 ENCOUNTER — Encounter: Payer: Self-pay | Admitting: Family Medicine

## 2018-07-06 ENCOUNTER — Ambulatory Visit (INDEPENDENT_AMBULATORY_CARE_PROVIDER_SITE_OTHER): Payer: Medicare Other | Admitting: Family Medicine

## 2018-07-06 VITALS — BP 140/82 | HR 75 | Resp 12 | Ht 72.0 in | Wt 189.1 lb

## 2018-07-06 DIAGNOSIS — F322 Major depressive disorder, single episode, severe without psychotic features: Secondary | ICD-10-CM | POA: Diagnosis not present

## 2018-07-06 DIAGNOSIS — H6121 Impacted cerumen, right ear: Secondary | ICD-10-CM | POA: Diagnosis not present

## 2018-07-06 DIAGNOSIS — H9190 Unspecified hearing loss, unspecified ear: Secondary | ICD-10-CM | POA: Insufficient documentation

## 2018-07-06 DIAGNOSIS — Z0000 Encounter for general adult medical examination without abnormal findings: Secondary | ICD-10-CM | POA: Diagnosis not present

## 2018-07-06 DIAGNOSIS — Z13220 Encounter for screening for lipoid disorders: Secondary | ICD-10-CM

## 2018-07-06 DIAGNOSIS — Z125 Encounter for screening for malignant neoplasm of prostate: Secondary | ICD-10-CM

## 2018-07-06 DIAGNOSIS — I10 Essential (primary) hypertension: Secondary | ICD-10-CM

## 2018-07-06 DIAGNOSIS — H9191 Unspecified hearing loss, right ear: Secondary | ICD-10-CM

## 2018-07-06 DIAGNOSIS — H6120 Impacted cerumen, unspecified ear: Secondary | ICD-10-CM | POA: Insufficient documentation

## 2018-07-06 MED ORDER — FLUOXETINE HCL 10 MG PO TABS: 10.0000 mg | ORAL_TABLET | Freq: Every day | ORAL | 3 refills | Status: DC

## 2018-07-06 NOTE — Progress Notes (Signed)
   Marc Ward     MRN: [id number]      DOB: 05-30-1951   HPI: Patient is in for annual physical exam. C/o left ear fullness and reduced hearing Recent labs, if available are reviewed. Immunization is reviewed , and  updated if needed.    PE; BP 140/82 (BP Location: Right Arm, Patient Position: Sitting, Cuff Size: Large)   Pulse 75   Resp 12   Ht 6' (1.829 m)   Wt 189 lb 1.9 oz (85.8 kg)   SpO2 96% Comment: room air  BMI 25.65 kg/m   Pleasant male, alert and oriented x 3, in no cardio-pulmonary distress. Afebrile. HEENT No facial trauma or asymetry. Sinuses non tender. EOMI, pupils equally reactive to light. External ears normal, right , tympanic membranes clear.Left TM occluded by cerumen Oropharynx moist, no exudate. Neck: supple, no adenopathy,JVD or thyromegaly.No bruits.  Chest: Clear to ascultation bilaterally.No crackles or wheezes. Non tender to palpation  Cardiovascular system; Heart sounds normal,  S1 and  S2 ,no S3.  No murmur, or thrill. Apical beat not displaced Peripheral pulses normal.  Abdomen: Soft, non tender, no organomegaly or masses. No bruits. Bowel sounds normal. No guarding, tenderness or rebound.      Musculoskeletal exam: Full ROM of spine, hips , shoulders and knees. No deformity ,swelling or crepitus noted. No muscle wasting or atrophy.   Neurologic: Cranial nerves 2 to 12 intact. Power, tone ,sensation and reflexes normal throughout. No disturbance in gait. No tremor.  Skin: Intact, no ulceration, erythema , scaling or rash noted. Pigmentation normal throughout  Psych; Depressed, flat  mood and affect. Judgement and concentration abnormal   Assessment & Plan:  Annual physical exam Annual exam as documented. Counseling done  re healthy lifestyle involving commitment to 150 minutes exercise per week, heart healthy diet, and attaining healthy weight.The importance of adequate sleep also discussed. Regular seat belt use  and home safety, is also discussed. Changes in health habits are decided on by the patient with goals and time frames  set for achieving them. Immunization and cancer screening needs are specifically addressed at this visit.   Depression, major, single episode, severe (HCC) Pt to start anti depressant, refuses therapy, not suicidal or homicidal, has self medicated with cocaine in the past, recent trigger reported is sudden death of close friends some younger than he is F/u in 6 weeks  Cerumen impaction Impacted cerumen of right ear, symptomatic with discomfort and reduced hearing, flush attempot unsuccessful, refer to ENT asap

## 2018-07-06 NOTE — Patient Instructions (Addendum)
F/U in 3 months, call if you need me before   Fasting lipid, cmp and EGFr ,  cBc and PSA  In 2 weeks  Attempt at flush has not been successful, and since you report hearing loss, you are referred urgently to ENT  New for depression is fluoxetine 10 mg  daily

## 2018-07-07 DIAGNOSIS — H6061 Unspecified chronic otitis externa, right ear: Secondary | ICD-10-CM | POA: Diagnosis not present

## 2018-07-07 DIAGNOSIS — H6121 Impacted cerumen, right ear: Secondary | ICD-10-CM | POA: Diagnosis not present

## 2018-07-08 DIAGNOSIS — F322 Major depressive disorder, single episode, severe without psychotic features: Secondary | ICD-10-CM | POA: Insufficient documentation

## 2018-07-08 NOTE — Assessment & Plan Note (Signed)

## 2018-07-08 NOTE — Assessment & Plan Note (Signed)
Pt to start anti depressant, refuses therapy, not suicidal or homicidal, has self medicated with cocaine in the past, recent trigger reported is sudden death of close friends some younger than he is F/u in 6 weeks

## 2018-07-08 NOTE — Assessment & Plan Note (Addendum)
Impacted cerumen of right ear, symptomatic with discomfort and reduced hearing, flush attempot unsuccessful, refer to ENT asap

## 2018-07-19 ENCOUNTER — Encounter: Payer: Self-pay | Admitting: *Deleted

## 2018-07-25 DIAGNOSIS — Z13220 Encounter for screening for lipoid disorders: Secondary | ICD-10-CM | POA: Diagnosis not present

## 2018-07-25 DIAGNOSIS — I10 Essential (primary) hypertension: Secondary | ICD-10-CM | POA: Diagnosis not present

## 2018-07-26 LAB — CBC
HCT: 36.3 % — ABNORMAL LOW (ref 38.5–50.0)
Hemoglobin: 12.2 g/dL — ABNORMAL LOW (ref 13.2–17.1)
MCH: 33 pg (ref 27.0–33.0)
MCHC: 33.6 g/dL (ref 32.0–36.0)
MCV: 98.1 fL (ref 80.0–100.0)
MPV: 10.9 fL (ref 7.5–12.5)
PLATELETS: 152 10*3/uL (ref 140–400)
RBC: 3.7 10*6/uL — AB (ref 4.20–5.80)
RDW: 12.4 % (ref 11.0–15.0)
WBC: 5.1 10*3/uL (ref 3.8–10.8)

## 2018-07-26 LAB — COMPLETE METABOLIC PANEL WITH GFR
AG Ratio: 1.6 (calc) (ref 1.0–2.5)
ALBUMIN MSPROF: 3.9 g/dL (ref 3.6–5.1)
ALT: 9 U/L (ref 9–46)
AST: 13 U/L (ref 10–35)
Alkaline phosphatase (APISO): 50 U/L (ref 40–115)
BUN: 15 mg/dL (ref 7–25)
CALCIUM: 8.6 mg/dL (ref 8.6–10.3)
CO2: 30 mmol/L (ref 20–32)
CREATININE: 1.1 mg/dL (ref 0.70–1.25)
Chloride: 110 mmol/L (ref 98–110)
GFR, EST NON AFRICAN AMERICAN: 68 mL/min/{1.73_m2} (ref >=60)
GFR, Est African American: 79 mL/min/{1.73_m2} (ref >=60)
GLOBULIN: 2.4 g/dL (ref 1.9–3.7)
Glucose, Bld: 95 mg/dL (ref 65–99)
Potassium: 4 mmol/L (ref 3.5–5.3)
SODIUM: 145 mmol/L (ref 135–146)
TOTAL PROTEIN: 6.3 g/dL (ref 6.1–8.1)
Total Bilirubin: 0.6 mg/dL (ref 0.2–1.2)

## 2018-07-26 LAB — LIPID PANEL
CHOL/HDL RATIO: 3.1 (calc) (ref <5.0)
Cholesterol: 116 mg/dL (ref <200)
HDL: 37 mg/dL — ABNORMAL LOW (ref >40)
LDL Cholesterol (Calc): 64 mg/dL (calc)
Non-HDL Cholesterol (Calc): 79 mg/dL (calc) (ref <130)
Triglycerides: 72 mg/dL (ref <150)

## 2018-07-26 LAB — PSA: PSA: 0.6 ng/mL (ref <=4.0)

## 2018-08-24 ENCOUNTER — Encounter: Payer: Self-pay | Admitting: Family Medicine

## 2018-09-09 ENCOUNTER — Ambulatory Visit: Payer: Medicare Other | Admitting: Gastroenterology

## 2018-10-05 ENCOUNTER — Encounter: Payer: Self-pay | Admitting: Family Medicine

## 2018-10-05 ENCOUNTER — Ambulatory Visit (INDEPENDENT_AMBULATORY_CARE_PROVIDER_SITE_OTHER): Payer: Medicare Other | Admitting: Family Medicine

## 2018-10-05 ENCOUNTER — Other Ambulatory Visit: Payer: Self-pay

## 2018-10-05 VITALS — BP 116/76 | HR 59 | Resp 14 | Ht 72.0 in | Wt 191.1 lb

## 2018-10-05 DIAGNOSIS — D126 Benign neoplasm of colon, unspecified: Secondary | ICD-10-CM | POA: Diagnosis not present

## 2018-10-05 DIAGNOSIS — J449 Chronic obstructive pulmonary disease, unspecified: Secondary | ICD-10-CM

## 2018-10-05 DIAGNOSIS — D573 Sickle-cell trait: Secondary | ICD-10-CM | POA: Diagnosis not present

## 2018-10-05 DIAGNOSIS — F1490 Cocaine use, unspecified, uncomplicated: Secondary | ICD-10-CM

## 2018-10-05 DIAGNOSIS — I10 Essential (primary) hypertension: Secondary | ICD-10-CM | POA: Diagnosis not present

## 2018-10-05 NOTE — Patient Instructions (Addendum)
Physical exam with MD 07/08/2019, call if you need me before   [facility] as before  Please have your colonoscopy, I will ask staff to contact the office, and explain your situation. Pt states he was called in for appt and the day he went to Dr Nona Dell office no one was there due to snow, and he has not heard since, p[lease contact that office so they can call him  and reschedule( [phone] mobile preferred_)

## 2018-10-05 NOTE — Progress Notes (Signed)
   Marc Ward     MRN: [id number]      DOB: [birth year]   HPI Marc Ward is here for follow up and re-evaluation of chronic medical conditions, medication management and review of any available recent lab and radiology data.  Preventive health is updated, specifically  Cancer screening and Immunization.   Still needs colonoscopy scheduled, delayed due to weather earlier this year The PT denies any adverse reactions to current medications since the last visit.  There are no new concerns.  There are no specific complaints   ROS Denies recent fever or chills. Denies sinus pressure, nasal congestion, ear pain or sore throat. Denies chest congestion, productive cough or wheezing. Denies chest pains, palpitations and leg swelling Denies abdominal pain, nausea, vomiting,diarrhea or constipation.   Denies dysuria, frequency, hesitancy or incontinence. Denies joint pain, swelling and limitation in mobility. Denies headaches, seizures, numbness, or tingling. Denies uncontrolled depression,neever filled the f;luoxetine , stating too expensive. Still grieving over recent losses, and lifestyle remains somewhat introverted. He denies  anxiety or insomnia. Denies skin break down or rash.   PE  BP 116/76   Pulse (!) 59   Resp 14   Ht 6' (1.829 m)   Wt 191 lb 1.3 oz (86.7 kg)   SpO2 98% Comment: room air  BMI 25.92 kg/m   Patient alert and oriented and in no cardiopulmonary distress.  HEENT: No facial asymmetry, EOMI,   oropharynx pink and moist.  Neck supple no JVD, no mass.  Chest: Clear to auscultation bilaterally.  CVS: S1, S2 no murmurs, no S3.Regular rate.  ABD: Soft non tender.   Ext: No edema  MS: Adequate ROM spine, shoulders, hips and knees.  Skin: Intact, no ulcerations or rash noted.  Psych: Good eye contact, normal affect. Memory intact not anxious or depressed appearing.  CNS: CN 2-12 intact, power,  normal throughout.no focal deficits noted.   Assessment & Plan   Essential hypertension Controlled, no change in medication DASH diet and commitment to daily physical activity for a minimum of 30 minutes discussed and encouraged, as a part of hypertension management. The importance of attaining a healthy weight is also discussed.  BP/Weight 10/05/2018 07/06/2018 03/28/2018 03/23/2018 12/21/2017 06/22/2017 6/59/9357  Systolic BP 017 793 903 009 233 007 622  Diastolic BP 76 82 90 77 80 80 90  Wt. (Lbs) 191.08 189.12 179 181.4 182 183 182  BMI 25.92 25.65 24.28 24.6 24.68 24.82 24.68       TUBULOVILLOUS ADENOMA, COLON Repeat colonoscopy needs to reschedule OV to have this as overdue  Cocaine use Cessation counseling done, reports that he  Continues to use cocaine. He is aware of potential  damage to heart and kidneys  COPD mixed type ([place]) Stable and controlled, no flares

## 2018-10-05 NOTE — Assessment & Plan Note (Signed)
Stable and controlled, no flares

## 2018-10-05 NOTE — Assessment & Plan Note (Signed)
Repeat colonoscopy needs to reschedule OV to have this as overdue

## 2018-10-05 NOTE — Assessment & Plan Note (Signed)
Cessation counseling done, reports that he  Continues to use cocaine. He is aware of potential  damage to heart and kidneys

## 2018-10-05 NOTE — Assessment & Plan Note (Signed)
Controlled, no change in medication DASH diet and commitment to daily physical activity for a minimum of 30 minutes discussed and encouraged, as a part of hypertension management. The importance of attaining a healthy weight is also discussed.  BP/Weight 10/05/2018 07/06/2018 03/28/2018 03/23/2018 12/21/2017 06/22/2017 1/41/0301  Systolic BP 314 388 875 797 282 060 156  Diastolic BP 76 82 90 77 80 80 90  Wt. (Lbs) 191.08 189.12 179 181.4 182 183 182  BMI 25.92 25.65 24.28 24.6 24.68 24.82 24.68

## 2018-11-30 ENCOUNTER — Encounter: Payer: Self-pay | Admitting: *Deleted

## 2018-12-22 ENCOUNTER — Other Ambulatory Visit: Payer: Self-pay | Admitting: Family Medicine

## 2018-12-23 ENCOUNTER — Telehealth: Payer: Self-pay | Admitting: Family Medicine

## 2018-12-23 NOTE — Telephone Encounter (Signed)
Pt request refill on  amLODipine (NORVASC) 5 MG tablet [[id number]]

## 2018-12-26 NOTE — Telephone Encounter (Signed)
Amlodipine was refilled on 12/23/2018

## 2019-01-03 ENCOUNTER — Other Ambulatory Visit: Payer: Self-pay

## 2019-01-03 ENCOUNTER — Ambulatory Visit (INDEPENDENT_AMBULATORY_CARE_PROVIDER_SITE_OTHER): Payer: Medicare Other | Admitting: Gastroenterology

## 2019-01-03 ENCOUNTER — Encounter: Payer: Self-pay | Admitting: Gastroenterology

## 2019-01-03 VITALS — BP 149/94 | HR 69 | Temp 97.1°F | Ht 72.0 in | Wt 192.8 lb

## 2019-01-03 DIAGNOSIS — Z86010 Personal history of colonic polyps: Secondary | ICD-10-CM

## 2019-01-03 DIAGNOSIS — F1490 Cocaine use, unspecified, uncomplicated: Secondary | ICD-10-CM | POA: Diagnosis not present

## 2019-01-03 NOTE — Progress Notes (Addendum)
Primary Care Physician:  Fayrene Helper, MD  Primary Gastroenterologist:  Barney Drain, MD  REVIEWED. PT SHOULD SERIOUSLY CONSIDER COUNSELING IF HE CANNOT STOP USING COCAINE FOR 30 DAYS. WE CANNOT PERFORM HIS TCS UNLESS HIS UDS IS NEGATIVE FOR COCAINE.  Chief Complaint  Patient presents with  . Colonoscopy    HPI:  Marc Ward is a 68 y.o. male here to reschedule his colonoscopy.  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  Colonoscopy in 2012 showed no recurrent adenomatous colon polyps.  In 2013 he presented with a bowel obstruction requiring small bowel resection and on pathology was found to have a tubulovillous adenoma of the small bowel.    He was scheduled for surveillance colonoscopy in 2017 but this was canceled due to positive drug screen for cocaine.  He failed to return until September 2019 which is when he was last seen.  He canceled his December 2019 colonoscopy because he slipped up and used cocaine again.  He presents today to try to schedule his colonoscopy again.  Doing well.  No bowel concerns.  No melena or rectal bleeding.  No abdominal pain.  No upper GI symptoms on omeprazole.  Last cocaine use 1 week ago.     Current Outpatient Medications  Medication Sig Dispense Refill  . acetaminophen (TYLENOL) 500 MG tablet Take 1,000 mg by mouth every 6 (six) hours as needed for mild pain.     Marland Kitchen amLODipine (NORVASC) 5 MG tablet TAKE 1 TABLET (5 MG TOTAL) BY MOUTH DAILY. 90 tablet 0  . aspirin 81 MG tablet Take 81 mg by mouth daily.    . cholecalciferol (VITAMIN D) 1000 units tablet Take 1,000 Units by mouth daily.    . Fluticasone Furoate-Vilanterol (BREO ELLIPTA IN) Inhale 1 puff into the lungs as needed (shortness of breath).     Marland Kitchen omeprazole (PRILOSEC) 20 MG capsule TAKE 1 CAPSULE (20 MG TOTAL) BY MOUTH DAILY. 90 capsule 0   No current facility-administered  medications for this visit.     Allergies as of 01/03/2019  . (No Known Allergies)    Past Medical History:  Diagnosis Date  . COPD (chronic obstructive pulmonary disease) ([place])   . GERD (gastroesophageal reflux disease)   . H. pylori infection FEB 2011   ABO BID x1O DAYS  . History of kidney stones   . Hypertension 2009  . IDA (iron deficiency anemia)   . Sickle cell trait ([place])   . Substance use disorder    cocaine and marijuana  . Tubulovillous adenoma of colon 2013   currently under close surveillance by GI    Past Surgical History:  Procedure Laterality Date  . ADENOIDECTOMY    . BOWEL RESECTION  11/08/2011   On pathology had a tubulovillous adenoma of the small bowel. Procedure: SMALL BOWEL RESECTION;  Surgeon: Jamesetta So, MD;  Location: [facility];  Service: General;  Laterality: N/A;  . CIRCUMCISION    . COLONOSCOPY  AUG 2010   ADVANCED [place] ADENOMA, TICS,   . COLONOSCOPY WITH ESOPHAGOGASTRODUODENOSCOPY (EGD)  06/2011   Dr. Oneida Alar: Hiatal hernia, moderate gastritis, mild duodenitis (benign small bowel mucosa on path, mild chronic gastritis with intestinal metaplasia but no H. pylori). On colonoscopy scattered diverticulosis, internal hemorrhoids, sessile polyps in the hepatic flexure and cecum, no adenomatous changes on path.  Marland Kitchen LAPAROTOMY  11/08/2011   Procedure: EXPLORATORY LAPAROTOMY;  Surgeon: Jeannette How  Arnoldo Morale, MD;  Location: [facility];  Service: General;  Laterality: N/A;  . TONSILLECTOMY    . UPPER GASTROINTESTINAL ENDOSCOPY  FEB 2011   H. PYLORI    Family History  Problem Relation Age of Onset  . Hypertension Mother   . Diabetes Father   . Hypertension Brother   . Sickle cell trait Daughter   . Sickle cell trait Son   . Colon cancer Neg Hx     Social History   Socioeconomic History  . Marital status: Divorced    Spouse name: Not on file  . Number of children: 3  . Years of education: college  . Highest education level: Some college, no degree   Occupational History  . Occupation: Unemployed     Employer: [employer]  . Financial resource strain: Not hard at all  . Food insecurity    Worry: Never true    Inability: Never true  . Transportation needs    Medical: No    Non-medical: No  Tobacco Use  . Smoking status: Never Smoker  . Smokeless tobacco: Never Used  Substance and Sexual Activity  . Alcohol use: Yes    Comment: occasional drinker-2 beers/wk  . Drug use: Yes    Frequency: 1.0 times per week    Types: Marijuana, Cocaine    Comment: 01/03/19--last week  . Sexual activity: Yes    Partners: Female    Birth control/protection: None    Comment: girlfriend  Lifestyle  . Physical activity    Days per week: 0 days    Minutes per session: 0 min  . Stress: Not at all  Relationships  . Social connections    Talks on phone: More than three times a week    Gets together: More than three times a week    Attends religious service: More than 4 times per year    Active member of club or organization: Yes    Attends meetings of clubs or organizations: Never    Relationship status: Divorced  . Intimate partner violence    Fear of current or ex partner: No    Emotionally abused: No    Physically abused: No    Forced sexual activity: No  Other Topics Concern  . Not on file  Social History Narrative  . Not on file      ROS:  General: Negative for anorexia, weight loss, fever, chills, fatigue, weakness. Eyes: Negative for vision changes.  ENT: Negative for hoarseness, difficulty swallowing , nasal congestion. CV: Negative for chest pain, angina, palpitations, dyspnea on exertion, peripheral edema.  Respiratory: Negative for dyspnea at rest, dyspnea on exertion, cough, sputum, wheezing.  GI: See history of present illness. GU:  Negative for dysuria, hematuria, urinary incontinence, urinary frequency, nocturnal urination.  MS: Negative for joint pain, low back pain.  Derm: Negative for rash  or itching.  Neuro: Negative for weakness, abnormal sensation, seizure, frequent headaches, memory loss, confusion.  Psych: Negative for anxiety, depression, suicidal ideation, hallucinations.  Endo: Negative for unusual weight change.  Heme: Negative for bruising or bleeding. Allergy: Negative for rash or hives.    Physical Examination:  BP (!) 149/94   Pulse 69   Temp (!) 97.1 F (36.2 C) (Oral)   Ht 6' (1.829 m)   Wt 192 lb 12.8 oz (87.5 kg)   BMI 26.15 kg/m    General: Well-nourished, well-developed in no acute distress.  Head: Normocephalic, atraumatic.   Eyes: Conjunctiva pink, no icterus. Mouth:  Oropharyngeal mucosa moist and pink , no lesions erythema or exudate. Neck: Supple without thyromegaly, masses, or lymphadenopathy.  Lungs: Clear to auscultation bilaterally.  Heart: Regular rate and rhythm, no murmurs rubs or gallops.  Abdomen: Bowel sounds are normal, nontender, nondistended, no hepatosplenomegaly or masses, no abdominal bruits or    hernia , no rebound or guarding.   Rectal: Not performed Extremities: No lower extremity edema. No clubbing or deformities.  Neuro: Alert and oriented x 4 , grossly normal neurologically.  Skin: Warm and dry, no rash or jaundice.   Psych: Alert and cooperative, normal mood and affect.  Labs: Lab Results  Component Value Date   CREATININE 1.10 07/25/2018   BUN 15 07/25/2018   NA 145 07/25/2018   K 4.0 07/25/2018   CL 110 07/25/2018   CO2 30 07/25/2018   Lab Results  Component Value Date   ALT 9 07/25/2018   AST 13 07/25/2018   ALKPHOS 45 07/31/2015   BILITOT 0.6 07/25/2018   Lab Results  Component Value Date   WBC 5.1 07/25/2018   HGB 12.2 (L) 07/25/2018   HCT 36.3 (L) 07/25/2018   MCV 98.1 07/25/2018   PLT 152 07/25/2018     Imaging Studies: No results found.

## 2019-01-03 NOTE — Patient Instructions (Signed)
1. We will be in touch to schedule colonoscopy once I discuss your sedation options with Dr. Oneida Alar.

## 2019-01-03 NOTE — Assessment & Plan Note (Signed)
68 year old gentleman overdue for surveillance colonoscopy, due back in 2017 for history of tubulovillous adenoma removed from the colon in 2010.  Last colonoscopy 2012 with no recurrent adenomatous colon polyps.  Patient was scheduled for deep sedation with propofol given his history of cocaine use but unfortunately he has not been able to refrain from use for 30 days prior to procedure as requested.  He has canceled procedure twice as outlined.  He would very much like to have his colonoscopy but he states that honestly is not sure he would be able to refrain from cocaine use.  His last colonoscopy was done with conscious sedation and he did well with this.  To discuss further with Dr. Oneida Alar.  I have discussed the risks, alternatives, benefits with regards to but not limited to the risk of reaction to medication, bleeding, infection, perforation and the patient is agreeable to proceed. Written consent to be obtained.

## 2019-01-03 NOTE — Progress Notes (Signed)
cc'ed to pcp °

## 2019-01-05 NOTE — Progress Notes (Signed)
Please let patient know that [facility] says we can only do his TCS if he is willing to stop cocaine for 30 days.   If he cannot stop cocaine for 30 days, he should consider counseling.   He can only have a TCS if his UDS is negative for cocaine.   Please find out what he wants to do as far as colonoscopy.

## 2019-01-06 ENCOUNTER — Telehealth: Payer: Self-pay

## 2019-01-06 NOTE — Telephone Encounter (Signed)
Pt returned call and was informed Dr. Oneida Alar said he should consider counsel to stop cocaine to be able to do colonoscopy.  Pt said he will think about it and let us know.

## 2019-01-06 NOTE — Progress Notes (Signed)
LMOM for a return call from pt.  

## 2019-01-06 NOTE — Progress Notes (Signed)
Letter has been mailed to pt to consider counseling to stop cocaine.

## 2019-01-06 NOTE — Telephone Encounter (Signed)
REVIEWED-NO ADDITIONAL RECOMMENDATIONS. 

## 2019-02-22 DIAGNOSIS — H40031 Anatomical narrow angle, right eye: Secondary | ICD-10-CM | POA: Diagnosis not present

## 2019-03-01 DIAGNOSIS — H40032 Anatomical narrow angle, left eye: Secondary | ICD-10-CM | POA: Diagnosis not present

## 2019-03-16 DIAGNOSIS — H40033 Anatomical narrow angle, bilateral: Secondary | ICD-10-CM | POA: Diagnosis not present

## 2019-03-16 DIAGNOSIS — H31091 Other chorioretinal scars, right eye: Secondary | ICD-10-CM | POA: Diagnosis not present

## 2019-03-16 DIAGNOSIS — H2513 Age-related nuclear cataract, bilateral: Secondary | ICD-10-CM | POA: Diagnosis not present

## 2019-03-16 DIAGNOSIS — H33321 Round hole, right eye: Secondary | ICD-10-CM | POA: Diagnosis not present

## 2019-03-21 ENCOUNTER — Other Ambulatory Visit: Payer: Self-pay | Admitting: Family Medicine

## 2019-03-29 ENCOUNTER — Encounter (INDEPENDENT_AMBULATORY_CARE_PROVIDER_SITE_OTHER): Payer: Self-pay | Admitting: Ophthalmology

## 2019-03-29 ENCOUNTER — Other Ambulatory Visit: Payer: Self-pay

## 2019-03-29 ENCOUNTER — Ambulatory Visit (INDEPENDENT_AMBULATORY_CARE_PROVIDER_SITE_OTHER): Payer: Medicare Other | Admitting: Ophthalmology

## 2019-03-29 DIAGNOSIS — H25813 Combined forms of age-related cataract, bilateral: Secondary | ICD-10-CM | POA: Diagnosis not present

## 2019-03-29 DIAGNOSIS — H3581 Retinal edema: Secondary | ICD-10-CM

## 2019-03-29 DIAGNOSIS — H33301 Unspecified retinal break, right eye: Secondary | ICD-10-CM

## 2019-03-29 DIAGNOSIS — I10 Essential (primary) hypertension: Secondary | ICD-10-CM

## 2019-03-29 DIAGNOSIS — H35033 Hypertensive retinopathy, bilateral: Secondary | ICD-10-CM

## 2019-03-29 MED ORDER — PREDNISOLONE ACETATE 1 % OP SUSP: 1.0000 [drp] | Freq: Four times a day (QID) | OPHTHALMIC | 0 refills | Status: AC

## 2019-03-29 NOTE — Progress Notes (Signed)
[facility] Note  03/29/2019     CHIEF COMPLAINT Patient presents for Retina Evaluation   HISTORY OF PRESENT ILLNESS: Marc Ward is a 68 y.o. male who presents to the clinic today for:   HPI    Retina Evaluation    In right eye.  Onset: unknown.  Duration: unknown.  Associated Symptoms Floaters.  Negative for Flashes, Blind Spot, Photophobia, Scalp Tenderness, Fever, Pain, Glare, Jaw Claudication, Weight Loss, Distortion, Redness, Trauma, Shoulder/Hip pain and Fatigue.  Context:  mid-range vision, distance vision and near vision.  Treatments tried include no treatments.  I, the attending physician,  performed the HPI with the patient and updated documentation appropriately.          Comments    Patient referred by Dr. Wyatt Portela for possible retinal tear OD. Patient reports seeing floaters mostly in OD. Floaters are not of recent onset. Have not increased recently. Denies flashes. Denies curtain/veil over vision. Denies history of eye trauma.        Last edited by Bernarda Caffey, MD on 03/29/2019  4:13 PM. (History)    pt states he is a regular pt of Dr. Wyatt Portela, he states he saw him a few weeks ago for a routine eye exam and Dr. Katy Fitch saw something in the back of his eye that he wanted to have checked out, pt states he has always had floaters,   Referring physician: Debbra Riding, MD [address] [place],  [place] [address]  HISTORICAL INFORMATION:   Selected notes from the MEDICAL RECORD NUMBER Referred by Dr. Wyatt Portela for concern of retinal tear OD LEE:  Ocular Hx- PMH-COPD, HTN, sickle cell trait, substance abuse (cocaine / marijuana)   CURRENT MEDICATIONS: Current Outpatient Medications (Ophthalmic Drugs)  Medication Sig  . prednisoLONE acetate (PRED FORTE) 1 % ophthalmic suspension Place 1 drop into the right eye 4 (four) times daily for 7 days.   No current facility-administered medications for this visit.  (Ophthalmic  Drugs)   Current Outpatient Medications (Other)  Medication Sig  . acetaminophen (TYLENOL) 500 MG tablet Take 1,000 mg by mouth every 6 (six) hours as needed for mild pain.   Marland Kitchen amLODipine (NORVASC) 5 MG tablet TAKE 1 TABLET BY MOUTH EVERY DAY  . aspirin 81 MG tablet Take 81 mg by mouth daily.  . cholecalciferol (VITAMIN D) 1000 units tablet Take 1,000 Units by mouth daily.  . Fluticasone Furoate-Vilanterol (BREO ELLIPTA IN) Inhale 1 puff into the lungs as needed (shortness of breath).   Marland Kitchen omeprazole (PRILOSEC) 20 MG capsule TAKE 1 CAPSULE BY MOUTH EVERY DAY   No current facility-administered medications for this visit.  (Other)      REVIEW OF SYSTEMS: ROS    Positive for: Eyes, Respiratory   Negative for: Constitutional, Gastrointestinal, Neurological, Skin, Genitourinary, Musculoskeletal, HENT, Endocrine, Cardiovascular, Psychiatric, Allergic/Imm, Heme/Lymph   Last edited by Roselee Nova D, COT on 03/29/2019  1:43 PM. (History)       ALLERGIES No Known Allergies  PAST MEDICAL HISTORY Past Medical History:  Diagnosis Date  . COPD (chronic obstructive pulmonary disease) ([place])   . GERD (gastroesophageal reflux disease)   . H. pylori infection FEB 2011   ABO BID x1O DAYS  . History of kidney stones   . Hypertension 2009  . IDA (iron deficiency anemia)   . Sickle cell trait ([place])   . Substance use disorder    cocaine and marijuana  . Tubulovillous adenoma  of colon 2013   currently under close surveillance by GI   Past Surgical History:  Procedure Laterality Date  . ADENOIDECTOMY    . BOWEL RESECTION  11/08/2011   On pathology had a tubulovillous adenoma of the small bowel. Procedure: SMALL BOWEL RESECTION;  Surgeon: Jamesetta So, MD;  Location: [facility];  Service: General;  Laterality: N/A;  . CIRCUMCISION    . COLONOSCOPY  AUG 2010   ADVANCED [place] ADENOMA, TICS,   . COLONOSCOPY WITH ESOPHAGOGASTRODUODENOSCOPY (EGD)  06/2011   Dr. Oneida Alar: Hiatal hernia, moderate  gastritis, mild duodenitis (benign small bowel mucosa on path, mild chronic gastritis with intestinal metaplasia but no H. pylori). On colonoscopy scattered diverticulosis, internal hemorrhoids, sessile polyps in the hepatic flexure and cecum, no adenomatous changes on path.  Marland Kitchen LAPAROTOMY  11/08/2011   Procedure: EXPLORATORY LAPAROTOMY;  Surgeon: Jamesetta So, MD;  Location: [facility];  Service: General;  Laterality: N/A;  . TONSILLECTOMY    . UPPER GASTROINTESTINAL ENDOSCOPY  FEB 2011   H. PYLORI    FAMILY HISTORY Family History  Problem Relation Age of Onset  . Hypertension Mother   . Diabetes Father   . Hypertension Brother   . Sickle cell trait Daughter   . Sickle cell trait Son   . Colon cancer Neg Hx     SOCIAL HISTORY Social History   Tobacco Use  . Smoking status: Never Smoker  . Smokeless tobacco: Never Used  Substance Use Topics  . Alcohol use: Yes    Comment: occasional drinker-2 beers/wk  . Drug use: Yes    Frequency: 1.0 times per week    Types: Marijuana, Cocaine    Comment: 01/03/19--last week         OPHTHALMIC EXAM:  Base Eye Exam    Visual Acuity (Snellen - Linear)      Right Left   Dist cc 20/40 +2 20/20   Dist ph cc 20/20 -1        Tonometry (Tonopen, 1:57 PM)      Right Left   Pressure 22 18       Pupils      Dark Light Shape React APD   Right 3 2 Round Brisk None   Left 3 2 Round Brisk None       Visual Fields (Counting fingers)      Left Right    Full Full       Extraocular Movement      Right Left    Full, Ortho Full, Ortho       Neuro/Psych    Oriented x3: Yes   Mood/Affect: Normal       Dilation    Both eyes: 1.0% Mydriacyl, 2.5% Phenylephrine @ 1:57 PM        Slit Lamp and Fundus Exam    Slit Lamp Exam      Right Left   Lids/Lashes Dermatochalasis - upper lid, Meibomian gland dysfunction Dermatochalasis - upper lid, Meibomian gland dysfunction   Conjunctiva/Sclera mild Melanosis mild Melanosis   Cornea Arcus  Arcus, 1+ Punctate epithelial erosions   Anterior Chamber Deep and quiet Deep and quiet   Iris Round and dilated Round and dilated, focal TID and atrophy from 0300-0400   Lens 2-3+ Nuclear sclerosis, 2+ Cortical cataract 2-3+ Nuclear sclerosis, 2+ Cortical cataract   Vitreous Vitreous syneresis Vitreous syneresis       Fundus Exam      Right Left   Disc compact, Pink and Sharp Pink and  Sharp   C/D Ratio 0.3 0.3   Macula Flat, Blunted foveal reflex, Drusen, Retinal pigment epithelial mottling, No heme or edema Flat, Blunted foveal reflex, Drusen, Retinal pigment epithelial mottling, No heme or edema   Vessels Mild Vascular attenuation, Tortuousity Mild Vascular attenuation, Tortuousity   Periphery Attached, pigmented VR tuft/retinal break at 1030--no SRF, mild White without pressure temporally Attached           Refraction    Wearing Rx      Sphere Cylinder Axis   Right +1.50 +1.25 004   Left +2.25 +0.75 165       Manifest Refraction      Sphere Cylinder Axis Dist VA   Right +1.25 +1.75 160 20/25+2   Left +2.25 +0.75 165 20/20          IMAGING AND PROCEDURES  Imaging and Procedures for @TODAY @  OCT, Retina - OU - Both Eyes       Right Eye Quality was good. Central Foveal Thickness: 228. Progression has no prior data. Findings include normal foveal contour, no IRF, no SRF.   Left Eye Quality was good. Central Foveal Thickness: 228. Progression has no prior data. Findings include normal foveal contour, no IRF, no SRF, retinal drusen .   Notes *Images captured and stored on drive  Diagnosis / Impression:  NFP, no IRF/SRF OU Drusen OS  Clinical management:  See below  Abbreviations: NFP - Normal foveal profile. CME - cystoid macular edema. PED - pigment epithelial detachment. IRF - intraretinal fluid. SRF - subretinal fluid. EZ - ellipsoid zone. ERM - epiretinal membrane. ORA - outer retinal atrophy. ORT - outer retinal tubulation. SRHM - subretinal hyper-reflective  material        Repair Retinal Breaks, Laser - OD - Right Eye       LASER PROCEDURE NOTE  Procedure:  Barrier laser retinopexy using slit lamp laser, RIGHT eye   Diagnosis:   Pigmented retinal break, RIGHT eye                     1030 o'clock anterior to equator   Surgeon: Bernarda Caffey, MD, PhD  Anesthesia: Topical  Informed consent obtained, operative eye marked, and time out performed prior to initiation of laser.   Laser settings:  Lumenis Smart532 laser, slit lamp Lens: [place] PRP 165 Power: 240 mW Spot size: 200 microns Duration: 30 msec  # spots: 228  Placement of laser: Using a [place] PRP 165 contact lens at the slit lamp, laser was placed in three confluent rows around pigmented retinal break at 1030 oclock anterior to equator with additional rows anteriorly.  Complications: None.  Patient tolerated the procedure well and received written and verbal post-procedure care information/education.                  ASSESSMENT/PLAN:    ICD-10-CM   1. Retinal break of right eye  H33.301 Repair Retinal Breaks, Laser - OD - Right Eye  2. Retinal edema  H35.81 OCT, Retina - OU - Both Eyes  3. Essential hypertension  I10   4. Hypertensive retinopathy of both eyes  H35.033   5. Combined forms of age-related cataract of both eyes  H25.813     1. Retinal break OD  - pigmented retinal defect located at 1030  - recommend laser retinopexy OD today, 09.09.20  - pt wishes to proceed  - RBA of procedure discussed, questions answered  - informed consent obtained and signed  - see  procedure note  - start PF QID OD x7 days  - f/u 3 weeks  2. No retinal edema on exam or OCT  3,4. Hypertensive retinopathy OU  - discussed importance of tight BP control  - monitor  5. Mixed form age related cataract OU  - The symptoms of cataract, surgical options, and treatments and risks were discussed with patient.  - discussed diagnosis and progression  - under the expert  management of Dr. Zenia Resides   Ophthalmic Meds Ordered this visit:  Meds ordered this encounter  Medications  . prednisoLONE acetate (PRED FORTE) 1 % ophthalmic suspension    Sig: Place 1 drop into the right eye 4 (four) times daily for 7 days.    Dispense:  10 mL    Refill:  0       Return in about 3 weeks (around 04/19/2019) for f/u retinal break OD, DFE, OCT.  There are no Patient Instructions on file for this visit.   Explained the diagnoses, plan, and follow up with the patient and they expressed understanding.  Patient expressed understanding of the importance of proper follow up care.   This document serves as a record of services personally performed by Gardiner Sleeper, MD, PhD. It was created on their behalf by Ernest Mallick, OA, an ophthalmic assistant. The creation of this record is the provider's dictation and/or activities during the visit.    Electronically signed by: Ernest Mallick, OA  09.09.2020 5:12 PM    Gardiner Sleeper, M.D., Ph.D. Diseases & Surgery of the Retina and Vitreous [facility]  I have reviewed the above documentation for accuracy and completeness, and I agree with the above. Gardiner Sleeper, M.D., Ph.D. 03/29/19 5:12 PM    Abbreviations: M myopia (nearsighted); A astigmatism; H hyperopia (farsighted); P presbyopia; Mrx spectacle prescription;  CTL contact lenses; OD right eye; OS left eye; OU both eyes  XT exotropia; ET esotropia; PEK punctate epithelial keratitis; PEE punctate epithelial erosions; DES dry eye syndrome; MGD meibomian gland dysfunction; ATs artificial tears; PFAT's preservative free artificial tears; [place] nuclear sclerotic cataract; PSC posterior subcapsular cataract; ERM epi-retinal membrane; PVD posterior vitreous detachment; RD retinal detachment; DM diabetes mellitus; DR diabetic retinopathy; NPDR non-proliferative diabetic retinopathy; PDR proliferative diabetic retinopathy; CSME clinically significant macular  edema; DME diabetic macular edema; dbh dot blot hemorrhages; CWS cotton wool spot; POAG primary open angle glaucoma; C/D cup-to-disc ratio; HVF humphrey visual field; GVF goldmann visual field; OCT optical coherence tomography; IOP intraocular pressure; BRVO Branch retinal vein occlusion; CRVO central retinal vein occlusion; CRAO central retinal artery occlusion; BRAO branch retinal artery occlusion; RT retinal tear; SB scleral buckle; PPV pars plana vitrectomy; VH Vitreous hemorrhage; PRP panretinal laser photocoagulation; IVK intravitreal kenalog; VMT vitreomacular traction; MH Macular hole;  NVD neovascularization of the disc; NVE neovascularization elsewhere; AREDS age related eye disease study; ARMD age related macular degeneration; POAG primary open angle glaucoma; EBMD epithelial/anterior basement membrane dystrophy; ACIOL anterior chamber intraocular lens; IOL intraocular lens; PCIOL posterior chamber intraocular lens; Phaco/IOL phacoemulsification with intraocular lens placement; [place] photorefractive keratectomy; LASIK laser assisted in situ keratomileusis; HTN hypertension; DM diabetes mellitus; COPD chronic obstructive pulmonary disease

## 2019-03-30 ENCOUNTER — Encounter: Payer: Self-pay | Admitting: Family Medicine

## 2019-03-30 ENCOUNTER — Other Ambulatory Visit: Payer: Self-pay

## 2019-03-30 ENCOUNTER — Ambulatory Visit (INDEPENDENT_AMBULATORY_CARE_PROVIDER_SITE_OTHER): Payer: Medicare Other | Admitting: Family Medicine

## 2019-03-30 ENCOUNTER — Ambulatory Visit: Payer: Medicare Other

## 2019-03-30 VITALS — BP 149/94 | HR 69 | Resp 12 | Ht 72.0 in | Wt 192.0 lb

## 2019-03-30 DIAGNOSIS — Z0000 Encounter for general adult medical examination without abnormal findings: Secondary | ICD-10-CM | POA: Diagnosis not present

## 2019-03-30 NOTE — Progress Notes (Signed)
Subjective:   Marc Ward is a 68 y.o. male who presents for Medicare Annual/Subsequent preventive examination.  Location of Patient: Home Location of Provider: Telehealth Consent was obtain for visit to be over via telehealth. I verified that I am speaking with the correct person using two identifiers.   Review of Systems:    Cardiac Risk Factors include: advanced age (>44men, >48 women);hypertension;male gender     Objective:    Vitals: BP (!) 149/94   Pulse 69   Resp 12   Ht 6' (1.829 m)   Wt 192 lb (87.1 kg)   BMI 26.04 kg/m   Body mass index is 26.04 kg/m.  Advanced Directives 03/28/2018 01/12/2017 07/02/2016 07/31/2015 07/30/2015 11/08/2011 11/08/2011  Does Patient Have a Medical Advance Directive? No No No No No Patient does not have advance directive Patient does not have advance directive  Would patient like information on creating a medical advance directive? Yes (ED - Information included in AVS) Yes (MAU/Ambulatory/Procedural Areas - Information given) No - Patient declined No - patient declined information No - patient declined information - -  Pre-existing out of facility DNR order (yellow form or pink MOST form) - - - - - No No    Tobacco Social History   Tobacco Use  Smoking Status Never Smoker  Smokeless Tobacco Never Used     Counseling given: Yes   Clinical Intake:  Pre-visit preparation completed: Yes  Pain : No/denies pain Pain Score: 0-No pain     BMI - recorded: 26.04 Nutritional Status: BMI 25 -29 Overweight Nutritional Risks: None Diabetes: No  How often do you need to have someone help you when you read instructions, pamphlets, or other written materials from your doctor or pharmacy?: 1 - Never What is the last grade level you completed in school?: college  Interpreter Needed?: No     Past Medical History:  Diagnosis Date  . COPD (chronic obstructive pulmonary disease) ([place])   . GERD (gastroesophageal reflux disease)   . H.  pylori infection FEB 2011   ABO BID x1O DAYS  . History of kidney stones   . Hypertension 2009  . IDA (iron deficiency anemia)   . Sickle cell trait ([place])   . Substance use disorder    cocaine and marijuana  . Tubulovillous adenoma of colon 2013   currently under close surveillance by GI   Past Surgical History:  Procedure Laterality Date  . ADENOIDECTOMY    . BOWEL RESECTION  11/08/2011   On pathology had a tubulovillous adenoma of the small bowel. Procedure: SMALL BOWEL RESECTION;  Surgeon: Jamesetta So, MD;  Location: [facility];  Service: General;  Laterality: N/A;  . CIRCUMCISION    . COLONOSCOPY  AUG 2010   ADVANCED [place] ADENOMA, TICS,   . COLONOSCOPY WITH ESOPHAGOGASTRODUODENOSCOPY (EGD)  06/2011   Dr. Oneida Alar: Hiatal hernia, moderate gastritis, mild duodenitis (benign small bowel mucosa on path, mild chronic gastritis with intestinal metaplasia but no H. pylori). On colonoscopy scattered diverticulosis, internal hemorrhoids, sessile polyps in the hepatic flexure and cecum, no adenomatous changes on path.  Marland Kitchen LAPAROTOMY  11/08/2011   Procedure: EXPLORATORY LAPAROTOMY;  Surgeon: Jamesetta So, MD;  Location: [facility];  Service: General;  Laterality: N/A;  . TONSILLECTOMY    . UPPER GASTROINTESTINAL ENDOSCOPY  FEB 2011   H. PYLORI   Family History  Problem Relation Age of Onset  . Hypertension Mother   . Diabetes Father   . Hypertension Brother   .  Sickle cell trait Daughter   . Sickle cell trait Son   . Colon cancer Neg Hx    Social History   Socioeconomic History  . Marital status: Divorced    Spouse name: Not on file  . Number of children: 3  . Years of education: college  . Highest education level: Some college, no degree  Occupational History  . Occupation: Unemployed     Employer: [employer]  . Financial resource strain: Not hard at all  . Food insecurity    Worry: Never true    Inability: Never true  . Transportation needs    Medical: No     Non-medical: No  Tobacco Use  . Smoking status: Never Smoker  . Smokeless tobacco: Never Used  Substance and Sexual Activity  . Alcohol use: Yes    Comment: occasional drinker-2 beers/wk  . Drug use: Yes    Frequency: 1.0 times per week    Types: Marijuana, Cocaine    Comment: 01/03/19--last week  . Sexual activity: Yes    Partners: Female    Birth control/protection: None    Comment: girlfriend  Lifestyle  . Physical activity    Days per week: 0 days    Minutes per session: 0 min  . Stress: Not at all  Relationships  . Social connections    Talks on phone: More than three times a week    Gets together: More than three times a week    Attends religious service: More than 4 times per year    Active member of club or organization: Yes    Attends meetings of clubs or organizations: Never    Relationship status: Divorced  Other Topics Concern  . Not on file  Social History Narrative  . Not on file    Outpatient Encounter Medications as of 03/30/2019  Medication Sig  . acetaminophen (TYLENOL) 500 MG tablet Take 1,000 mg by mouth every 6 (six) hours as needed for mild pain.   Marland Kitchen amLODipine (NORVASC) 5 MG tablet TAKE 1 TABLET BY MOUTH EVERY DAY  . aspirin 81 MG tablet Take 81 mg by mouth daily.  . cholecalciferol (VITAMIN D) 1000 units tablet Take 1,000 Units by mouth daily.  . Fluticasone Furoate-Vilanterol (BREO ELLIPTA IN) Inhale 1 puff into the lungs as needed (shortness of breath).   Marland Kitchen omeprazole (PRILOSEC) 20 MG capsule TAKE 1 CAPSULE BY MOUTH EVERY DAY  . prednisoLONE acetate (PRED FORTE) 1 % ophthalmic suspension Place 1 drop into the right eye 4 (four) times daily for 7 days.   No facility-administered encounter medications on file as of 03/30/2019.     Activities of Daily Living In your present state of health, do you have any difficulty performing the following activities: 03/30/2019  Hearing? N  Vision? N  Difficulty concentrating or making decisions? Y   Comment somtimes  Walking or climbing stairs? N  Dressing or bathing? N  Doing errands, shopping? N  Preparing Food and eating ? N  Using the Toilet? N  In the past six months, have you accidently leaked urine? N  Do you have problems with loss of bowel control? N  Managing your Medications? N  Managing your Finances? N  Housekeeping or managing your Housekeeping? N  Some recent data might be hidden    Patient Care Team: Fayrene Helper, MD as PCP - General Danie Binder, MD (Gastroenterology)   Assessment:   This is a routine wellness examination for [place].  Exercise Activities and Dietary recommendations Current Exercise Habits: Home exercise routine, Type of exercise: walking, Time (Minutes): 15, Frequency (Times/Week): 2, Weekly Exercise (Minutes/Week): 30, Intensity: Mild, Exercise limited by: None identified  Goals    . Weight (lb) < 177 lb (80.3 kg)     Patient would like to lose 5 lbs.       Fall Risk Fall Risk  03/30/2019 10/05/2018 07/06/2018 03/28/2018 12/21/2017  Falls in the past year? 0 0 0 No No  Number falls in past yr: 0 - 0 - -  Injury with Fall? 0 0 0 - -  Follow up - - - - -   Is the patient's home free of loose throw rugs in walkways, pet beds, electrical cords, etc?   yes      Grab bars in the bathroom? yes      Handrails on the stairs?   yes      Adequate lighting?   yes     Depression Screen PHQ 2/9 Scores 03/30/2019 10/05/2018 10/05/2018 07/06/2018  PHQ - 2 Score 1 0 0 6  PHQ- 9 Score - - - 19    Cognitive Function     6CIT Screen 03/30/2019 03/28/2018 01/12/2017  What Year? 0 points 0 points 0 points  What month? 0 points 0 points 0 points  What time? 0 points 0 points 0 points  Count back from 20 0 points 0 points 0 points  Months in reverse 0 points 0 points 0 points  Repeat phrase 0 points 0 points 0 points  Total Score 0 0 0    Immunization History  Administered Date(s) Administered  . Influenza,inj,Quad PF,6+ Mos 03/30/2013,  06/21/2014, 07/29/2015, 06/18/2016, 06/22/2017, 03/28/2018  . Pneumococcal Conjugate-13 10/11/2014  . Pneumococcal Polysaccharide-23 11/09/2011, 12/16/2016  . Td 01/22/2010  . Zoster Recombinat (Shingrix) 12/21/2017, 01/24/2018, 07/18/2018    Qualifies for Shingles Vaccine?  Completed   Screening Tests Health Maintenance  Topic Date Due  . COLONOSCOPY  07/06/2014  . INFLUENZA VACCINE  02/18/2019  . TETANUS/TDAP  01/23/2020  . Hepatitis C Screening  Completed  . PNA vac Low Risk Adult  Completed   Cancer Screenings: Lung: Low Dose CT Chest recommended if Age 61-80 years, 30 pack-year currently smoking OR have quit w/in 15years. Patient does not qualify. Colorectal: Needs to get schedule, just has not taken the time to do it yet  Additional Screenings:   Hepatitis C Screening: Completed       Plan:      1. Encounter for Medicare annual wellness exam   I have personally reviewed and noted the following in the patient's chart:   . Medical and social history . Use of alcohol, tobacco or illicit drugs  . Current medications and supplements . Functional ability and status . Nutritional status . Physical activity . Advanced directives . List of other physicians . Hospitalizations, surgeries, and ER visits in previous 12 months . Vitals . Screenings to include cognitive, depression, and falls . Referrals and appointments  In addition, I have reviewed and discussed with patient certain preventive protocols, quality metrics, and best practice recommendations. A written personalized care plan for preventive services as well as general preventive health recommendations were provided to patient.   I provided 20 minutes of non-face-to-face time during this encounter.   Perlie Mayo, NP  03/30/2019

## 2019-03-30 NOTE — Patient Instructions (Addendum)
[name] , Thank you for taking time to come for your Medicare Wellness Visit. I appreciate your ongoing commitment to your health goals. Please review the following plan we discussed and let me know if I can assist you in the future.   Please continue to practice social distancing to keep you, your family, and our community safe.  If you must go out, please wear a Mask and practice good handwashing.    Screening recommendations/referrals: Colonoscopy: Please get scheduled when you can Recommended yearly ophthalmology/optometry visit for glaucoma screening and checkup Recommended yearly dental visit for hygiene and checkup  Vaccinations: Influenza vaccine: Due *needs flu clinic appt  Pneumococcal vaccine: Completed Tdap vaccine: Due 2021 Shingles vaccine: Completed   Advanced directives: You reported that you have paperwork.  Conditions/risks identified: Fall   Next appointment: 07/10/2019   Preventive Care 70 Years and Older, Male Preventive care refers to lifestyle choices and visits with your health care provider that can promote health and wellness. What does preventive care include?  A yearly physical exam. This is also called an annual well check.  Dental exams once or twice a year.  Routine eye exams. Ask your health care provider how often you should have your eyes checked.  Personal lifestyle choices, including:  Daily care of your teeth and gums.  Regular physical activity.  Eating a healthy diet.  Avoiding tobacco and drug use.  Limiting alcohol use.  Practicing safe sex.  Taking low doses of aspirin every day.  Taking vitamin and mineral supplements as recommended by your health care provider. What happens during an annual well check? The services and screenings done by your health care provider during your annual well check will depend on your age, overall health, lifestyle risk factors, and family history of disease. Counseling  Your health care  provider may ask you questions about your:  Alcohol use.  Tobacco use.  Drug use.  Emotional well-being.  Home and relationship well-being.  Sexual activity.  Eating habits.  History of falls.  Memory and ability to understand (cognition).  Work and work Statistician. Screening  You may have the following tests or measurements:  Height, weight, and BMI.  Blood pressure.  Lipid and cholesterol levels. These may be checked every 5 years, or more frequently if you are over 81 years old.  Skin check.  Lung cancer screening. You may have this screening every year starting at age 54 if you have a 30-pack-year history of smoking and currently smoke or have quit within the past 15 years.  Fecal occult blood test (FOBT) of the stool. You may have this test every year starting at age 12.  Flexible sigmoidoscopy or colonoscopy. You may have a sigmoidoscopy every 5 years or a colonoscopy every 10 years starting at age 23.  Prostate cancer screening. Recommendations will vary depending on your family history and other risks.  Hepatitis C blood test.  Hepatitis B blood test.  Sexually transmitted disease (STD) testing.  Diabetes screening. This is done by checking your blood sugar (glucose) after you have not eaten for a while (fasting). You may have this done every 1-3 years.  Abdominal aortic aneurysm (AAA) screening. You may need this if you are a current or former smoker.  Osteoporosis. You may be screened starting at age 67 if you are at high risk. Talk with your health care provider about your test results, treatment options, and if necessary, the need for more tests. Vaccines  Your health care provider may  recommend certain vaccines, such as:  Influenza vaccine. This is recommended every year.  Tetanus, diphtheria, and acellular pertussis (Tdap, Td) vaccine. You may need a Td booster every 10 years.  Zoster vaccine. You may need this after age 33.  Pneumococcal  13-valent conjugate (PCV13) vaccine. One dose is recommended after age 33.  Pneumococcal polysaccharide (PPSV23) vaccine. One dose is recommended after age 72. Talk to your health care provider about which screenings and vaccines you need and how often you need them. This information is not intended to replace advice given to you by your health care provider. Make sure you discuss any questions you have with your health care provider. Document Released: 08/02/2015 Document Revised: 03/25/2016 Document Reviewed: 05/07/2015 Elsevier Interactive Patient Education  2017 [place] Prevention in the Home Falls can cause injuries. They can happen to people of all ages. There are many things you can do to make your home safe and to help prevent falls. What can I do on the outside of my home?  Regularly fix the edges of walkways and driveways and fix any cracks.  Remove anything that might make you trip as you walk through a door, such as a raised step or threshold.  Trim any bushes or trees on the path to your home.  Use bright outdoor lighting.  Clear any walking paths of anything that might make someone trip, such as rocks or tools.  Regularly check to see if handrails are loose or broken. Make sure that both sides of any steps have handrails.  Any raised decks and porches should have guardrails on the edges.  Have any leaves, snow, or ice cleared regularly.  Use sand or salt on walking paths during winter.  Clean up any spills in your garage right away. This includes oil or grease spills. What can I do in the bathroom?  Use night lights.  Install grab bars by the toilet and in the tub and shower. Do not use towel bars as grab bars.  Use non-skid mats or decals in the tub or shower.  If you need to sit down in the shower, use a plastic, non-slip stool.  Keep the floor dry. Clean up any water that spills on the floor as soon as it happens.  Remove soap buildup in the  tub or shower regularly.  Attach bath mats securely with double-sided non-slip rug tape.  Do not have throw rugs and other things on the floor that can make you trip. What can I do in the bedroom?  Use night lights.  Make sure that you have a light by your bed that is easy to reach.  Do not use any sheets or blankets that are too big for your bed. They should not hang down onto the floor.  Have a firm chair that has side arms. You can use this for support while you get dressed.  Do not have throw rugs and other things on the floor that can make you trip. What can I do in the kitchen?  Clean up any spills right away.  Avoid walking on wet floors.  Keep items that you use a lot in easy-to-reach places.  If you need to reach something above you, use a strong step stool that has a grab bar.  Keep electrical cords out of the way.  Do not use floor polish or wax that makes floors slippery. If you must use wax, use non-skid floor wax.  Do not have throw rugs and  other things on the floor that can make you trip. What can I do with my stairs?  Do not leave any items on the stairs.  Make sure that there are handrails on both sides of the stairs and use them. Fix handrails that are broken or loose. Make sure that handrails are as long as the stairways.  Check any carpeting to make sure that it is firmly attached to the stairs. Fix any carpet that is loose or worn.  Avoid having throw rugs at the top or bottom of the stairs. If you do have throw rugs, attach them to the floor with carpet tape.  Make sure that you have a light switch at the top of the stairs and the bottom of the stairs. If you do not have them, ask someone to add them for you. What else can I do to help prevent falls?  Wear shoes that:  Do not have high heels.  Have rubber bottoms.  Are comfortable and fit you well.  Are closed at the toe. Do not wear sandals.  If you use a stepladder:  Make sure that it  is fully opened. Do not climb a closed stepladder.  Make sure that both sides of the stepladder are locked into place.  Ask someone to hold it for you, if possible.  Clearly mark and make sure that you can see:  Any grab bars or handrails.  First and last steps.  Where the edge of each step is.  Use tools that help you move around (mobility aids) if they are needed. These include:  Canes.  Walkers.  Scooters.  Crutches.  Turn on the lights when you go into a dark area. Replace any light bulbs as soon as they burn out.  Set up your furniture so you have a clear path. Avoid moving your furniture around.  If any of your floors are uneven, fix them.  If there are any pets around you, be aware of where they are.  Review your medicines with your doctor. Some medicines can make you feel dizzy. This can increase your chance of falling. Ask your doctor what other things that you can do to help prevent falls. This information is not intended to replace advice given to you by your health care provider. Make sure you discuss any questions you have with your health care provider. Document Released: 05/02/2009 Document Revised: 12/12/2015 Document Reviewed: 08/10/2014 Elsevier Interactive Patient Education  2017 [employer].

## 2019-03-31 ENCOUNTER — Ambulatory Visit: Payer: Medicare Other

## 2019-04-03 ENCOUNTER — Ambulatory Visit: Payer: Medicare Other

## 2019-04-03 DIAGNOSIS — J45991 Cough variant asthma: Secondary | ICD-10-CM | POA: Diagnosis not present

## 2019-04-03 DIAGNOSIS — I10 Essential (primary) hypertension: Secondary | ICD-10-CM | POA: Diagnosis not present

## 2019-04-18 NOTE — Progress Notes (Signed)
[facility] Note  04/19/2019     CHIEF COMPLAINT Patient presents for Retina Follow Up   HISTORY OF PRESENT ILLNESS: Marc Ward is a 68 y.o. male who presents to the clinic today for:   HPI    Retina Follow Up    Patient presents with  Retinal Break/Detachment.  In right eye.  This started 3 weeks ago.  Severity is moderate.  Duration of 3 weeks.  Since onset it is gradually improving.  I, the attending physician,  performed the HPI with the patient and updated documentation appropriately.          Comments    68 y/o male pt here for 3 wk f/u s/p laser retinopexy OD 9.9.20.  No noticed change in [place] OU.  Denies pain, flashes, floaters.  No gtts.       Last edited by Bernarda Caffey, MD on 04/19/2019 11:34 AM. (History)    pt states he is a regular pt of Dr. Wyatt Portela, he states he saw him a few weeks ago for a routine eye exam and Dr. Katy Fitch saw something in the back of his eye that he wanted to have checked out, pt states he has always had floaters,   Referring physician: Fayrene Helper, MD [address] [place],  [place] [address]  HISTORICAL INFORMATION:   Selected notes from the MEDICAL RECORD NUMBER Referred by Dr. Wyatt Portela for concern of retinal tear OD LEE:  Ocular Hx- PMH-COPD, HTN, sickle cell trait, substance abuse (cocaine / marijuana)   CURRENT MEDICATIONS: No current outpatient medications on file. (Ophthalmic Drugs)   No current facility-administered medications for this visit.  (Ophthalmic Drugs)   Current Outpatient Medications (Other)  Medication Sig  . acetaminophen (TYLENOL) 500 MG tablet Take 1,000 mg by mouth every 6 (six) hours as needed for mild pain.   Marland Kitchen amLODipine (NORVASC) 5 MG tablet TAKE 1 TABLET BY MOUTH EVERY DAY  . aspirin 81 MG tablet Take 81 mg by mouth daily.  . cholecalciferol (VITAMIN D) 1000 units tablet Take 1,000 Units by mouth daily.  . Fluticasone Furoate-Vilanterol (BREO ELLIPTA IN)  Inhale 1 puff into the lungs as needed (shortness of breath).   Marland Kitchen omeprazole (PRILOSEC) 20 MG capsule TAKE 1 CAPSULE BY MOUTH EVERY DAY   No current facility-administered medications for this visit.  (Other)      REVIEW OF SYSTEMS: ROS    Positive for: Gastrointestinal, HENT, Eyes, Respiratory   Negative for: Constitutional, Neurological, Skin, Genitourinary, Musculoskeletal, Endocrine, Cardiovascular, Psychiatric, Allergic/Imm, Heme/Lymph   Last edited by Matthew Folks, COA on 04/19/2019  9:43 AM. (History)       ALLERGIES No Known Allergies  PAST MEDICAL HISTORY Past Medical History:  Diagnosis Date  . Cataract    OU  . COPD (chronic obstructive pulmonary disease) ([place])   . GERD (gastroesophageal reflux disease)   . H. pylori infection FEB 2011   ABO BID x1O DAYS  . History of kidney stones   . Hypertension 2009  . Hypertensive retinopathy    OU  . IDA (iron deficiency anemia)   . Sickle cell trait ([place])   . Substance use disorder    cocaine and marijuana  . Tubulovillous adenoma of colon 2013   currently under close surveillance by GI   Past Surgical History:  Procedure Laterality Date  . ADENOIDECTOMY    . BOWEL RESECTION  11/08/2011   On pathology had a tubulovillous adenoma  of the small bowel. Procedure: SMALL BOWEL RESECTION;  Surgeon: Jamesetta So, MD;  Location: [facility];  Service: General;  Laterality: N/A;  . CIRCUMCISION    . COLONOSCOPY  AUG 2010   ADVANCED [place] ADENOMA, TICS,   . COLONOSCOPY WITH ESOPHAGOGASTRODUODENOSCOPY (EGD)  06/2011   Dr. Oneida Alar: Hiatal hernia, moderate gastritis, mild duodenitis (benign small bowel mucosa on path, mild chronic gastritis with intestinal metaplasia but no H. pylori). On colonoscopy scattered diverticulosis, internal hemorrhoids, sessile polyps in the hepatic flexure and cecum, no adenomatous changes on path.  Marland Kitchen LAPAROTOMY  11/08/2011   Procedure: EXPLORATORY LAPAROTOMY;  Surgeon: Jamesetta So, MD;  Location: [facility]  [facility];  Service: General;  Laterality: N/A;  . TONSILLECTOMY    . UPPER GASTROINTESTINAL ENDOSCOPY  FEB 2011   H. PYLORI    FAMILY HISTORY Family History  Problem Relation Age of Onset  . Hypertension Mother   . Diabetes Father   . Hypertension Brother   . Sickle cell trait Daughter   . Sickle cell trait Son   . Colon cancer Neg Hx     SOCIAL HISTORY Social History   Tobacco Use  . Smoking status: Never Smoker  . Smokeless tobacco: Never Used  Substance Use Topics  . Alcohol use: Yes    Comment: occasional drinker-2 beers/wk  . Drug use: Yes    Frequency: 1.0 times per week    Types: Marijuana, Cocaine    Comment: 01/03/19--last week         OPHTHALMIC EXAM:  Base Eye Exam    Visual Acuity (Snellen - Linear)      Right Left   Dist cc 20/25 -2 20/20 -2   Dist ph cc 20/20 -2    Correction: Glasses       Tonometry (Tonopen, 9:45 AM)      Right Left   Pressure 19 18       Pupils      Dark Light Shape React APD   Right 3 2 Round Brisk None   Left 3 2 Round Brisk None       Visual Fields (Counting fingers)      Left Right    Full Full       Extraocular Movement      Right Left    Full, Ortho Full, Ortho       Neuro/Psych    Oriented x3: Yes   Mood/Affect: Normal       Dilation    Both eyes: 1.0% Mydriacyl, 2.5% Phenylephrine @ 9:45 AM        Slit Lamp and Fundus Exam    Slit Lamp Exam      Right Left   Lids/Lashes Dermatochalasis - upper lid, Meibomian gland dysfunction Dermatochalasis - upper lid, Meibomian gland dysfunction   Conjunctiva/Sclera mild Melanosis mild Melanosis   Cornea Arcus Arcus, 1+ Punctate epithelial erosions   Anterior Chamber Deep and quiet Deep and quiet   Iris Round and dilated Round and dilated, focal TID and atrophy from 0300-0400   Lens 2-3+ Nuclear sclerosis, 2+ Cortical cataract 2-3+ Nuclear sclerosis, 2+ Cortical cataract   Vitreous Vitreous syneresis Vitreous syneresis       Fundus Exam      Right Left    Disc compact, Pink and Sharp Pink and Sharp   C/D Ratio 0.3 0.3   Macula Flat, Blunted foveal reflex, Drusen, Retinal pigment epithelial mottling, No heme or edema Flat, Blunted foveal reflex, Drusen, Retinal pigment epithelial mottling, No heme  or edema   Vessels Mild Vascular attenuation, Tortuousity Mild Vascular attenuation, Tortuousity   Periphery Attached, pigmented VR tuft/retinal break at 1030 -- good laser surrounding ?light scarring inf border--no SRF, mild White without pressure temporally Attached             IMAGING AND PROCEDURES  Imaging and Procedures for @TODAY @  OCT, Retina - OU - Both Eyes       Right Eye Quality was good. Central Foveal Thickness: 231. Progression has been stable. Findings include normal foveal contour, no IRF, no SRF.   Left Eye Quality was good. Central Foveal Thickness: 224. Progression has been stable. Findings include normal foveal contour, no IRF, no SRF, retinal drusen .   Notes *Images captured and stored on drive  Diagnosis / Impression:  NFP, no IRF/SRF OU--stable from prior Drusen OS  Clinical management:  See below  Abbreviations: NFP - Normal foveal profile. CME - cystoid macular edema. PED - pigment epithelial detachment. IRF - intraretinal fluid. SRF - subretinal fluid. EZ - ellipsoid zone. ERM - epiretinal membrane. ORA - outer retinal atrophy. ORT - outer retinal tubulation. SRHM - subretinal hyper-reflective material                 ASSESSMENT/PLAN:    ICD-10-CM   1. Retinal break of right eye  H33.301   2. Retinal edema  H35.81 OCT, Retina - OU - Both Eyes  3. Essential hypertension  I10   4. Hypertensive retinopathy of both eyes  H35.033   5. Combined forms of age-related cataract of both eyes  H25.813     1. Retinal break OD  - pigmented retinal defect located at 1030  - s/p laser retinopexy OD (09.09.20) -- good laser in place ?light scarring at inferior border  - completed PF QID OD x7 days  - f/u 3  months -- may need some mild touch up laser retinopexy  2. No retinal edema on exam or OCT  3,4. Hypertensive retinopathy OU  - discussed importance of tight BP control  - monitor  5. Mixed form age related cataract OU  - The symptoms of cataract, surgical options, and treatments and risks were discussed with patient.  - discussed diagnosis and progression  - under the expert management of Dr. Zenia Resides   Ophthalmic Meds Ordered this visit:  No orders of the defined types were placed in this encounter.      Return in about 3 months (around 07/19/2019) for DFE, OCT.  There are no Patient Instructions on file for this visit.   Explained the diagnoses, plan, and follow up with the patient and they expressed understanding.  Patient expressed understanding of the importance of proper follow up care.   This document serves as a record of services personally performed by Gardiner Sleeper, MD, PhD. It was created on their behalf by Ernest Mallick, OA, an ophthalmic assistant. The creation of this record is the provider's dictation and/or activities during the visit.    Electronically signed by: Ernest Mallick, OA 09.29.2020 11:36 AM    Gardiner Sleeper, M.D., Ph.D. Diseases & Surgery of the Retina and Vitreous [facility]  I have reviewed the above documentation for accuracy and completeness, and I agree with the above. Gardiner Sleeper, M.D., Ph.D. 04/19/19 11:36 AM    Abbreviations: M myopia (nearsighted); A astigmatism; H hyperopia (farsighted); P presbyopia; Mrx spectacle prescription;  CTL contact lenses; OD right eye; OS left eye; OU both eyes  XT exotropia; ET esotropia; PEK punctate epithelial keratitis; PEE punctate epithelial erosions; DES dry eye syndrome; MGD meibomian gland dysfunction; ATs artificial tears; PFAT's preservative free artificial tears; [place] nuclear sclerotic cataract; PSC posterior subcapsular cataract; ERM epi-retinal membrane; PVD posterior  vitreous detachment; RD retinal detachment; DM diabetes mellitus; DR diabetic retinopathy; NPDR non-proliferative diabetic retinopathy; PDR proliferative diabetic retinopathy; CSME clinically significant macular edema; DME diabetic macular edema; dbh dot blot hemorrhages; CWS cotton wool spot; POAG primary open angle glaucoma; C/D cup-to-disc ratio; HVF humphrey visual field; GVF goldmann visual field; OCT optical coherence tomography; IOP intraocular pressure; BRVO Branch retinal vein occlusion; CRVO central retinal vein occlusion; CRAO central retinal artery occlusion; BRAO branch retinal artery occlusion; RT retinal tear; SB scleral buckle; PPV pars plana vitrectomy; VH Vitreous hemorrhage; PRP panretinal laser photocoagulation; IVK intravitreal kenalog; VMT vitreomacular traction; MH Macular hole;  NVD neovascularization of the disc; NVE neovascularization elsewhere; AREDS age related eye disease study; ARMD age related macular degeneration; POAG primary open angle glaucoma; EBMD epithelial/anterior basement membrane dystrophy; ACIOL anterior chamber intraocular lens; IOL intraocular lens; PCIOL posterior chamber intraocular lens; Phaco/IOL phacoemulsification with intraocular lens placement; [place] photorefractive keratectomy; LASIK laser assisted in situ keratomileusis; HTN hypertension; DM diabetes mellitus; COPD chronic obstructive pulmonary disease

## 2019-04-19 ENCOUNTER — Encounter (INDEPENDENT_AMBULATORY_CARE_PROVIDER_SITE_OTHER): Payer: Self-pay | Admitting: Ophthalmology

## 2019-04-19 ENCOUNTER — Ambulatory Visit (INDEPENDENT_AMBULATORY_CARE_PROVIDER_SITE_OTHER): Payer: Medicare Other | Admitting: Ophthalmology

## 2019-04-19 ENCOUNTER — Other Ambulatory Visit: Payer: Self-pay

## 2019-04-19 DIAGNOSIS — H35033 Hypertensive retinopathy, bilateral: Secondary | ICD-10-CM

## 2019-04-19 DIAGNOSIS — H3581 Retinal edema: Secondary | ICD-10-CM

## 2019-04-19 DIAGNOSIS — H33301 Unspecified retinal break, right eye: Secondary | ICD-10-CM

## 2019-04-19 DIAGNOSIS — I10 Essential (primary) hypertension: Secondary | ICD-10-CM

## 2019-04-19 DIAGNOSIS — H25813 Combined forms of age-related cataract, bilateral: Secondary | ICD-10-CM

## 2019-04-24 ENCOUNTER — Ambulatory Visit (INDEPENDENT_AMBULATORY_CARE_PROVIDER_SITE_OTHER): Payer: Medicare Other

## 2019-04-24 ENCOUNTER — Other Ambulatory Visit: Payer: Self-pay

## 2019-04-24 DIAGNOSIS — Z23 Encounter for immunization: Secondary | ICD-10-CM | POA: Diagnosis not present

## 2019-05-11 ENCOUNTER — Emergency Department (HOSPITAL_COMMUNITY): Payer: Medicare Other

## 2019-05-11 ENCOUNTER — Encounter (HOSPITAL_COMMUNITY): Payer: Self-pay

## 2019-05-11 ENCOUNTER — Other Ambulatory Visit: Payer: Self-pay

## 2019-05-11 ENCOUNTER — Emergency Department (HOSPITAL_COMMUNITY)
Admission: EM | Admit: 2019-05-11 | Discharge: 2019-05-11 | Disposition: A | Discharge status: Home / Self Care | Payer: Medicare Other | Attending: Emergency Medicine | Admitting: Emergency Medicine

## 2019-05-11 DIAGNOSIS — Y999 Unspecified external cause status: Secondary | ICD-10-CM | POA: Insufficient documentation

## 2019-05-11 DIAGNOSIS — Z7982 Long term (current) use of aspirin: Secondary | ICD-10-CM | POA: Insufficient documentation

## 2019-05-11 DIAGNOSIS — I10 Essential (primary) hypertension: Secondary | ICD-10-CM | POA: Insufficient documentation

## 2019-05-11 DIAGNOSIS — S46811A Strain of other muscles, fascia and tendons at shoulder and upper arm level, right arm, initial encounter: Secondary | ICD-10-CM | POA: Insufficient documentation

## 2019-05-11 DIAGNOSIS — S46819A Strain of other muscles, fascia and tendons at shoulder and upper arm level, unspecified arm, initial encounter: Secondary | ICD-10-CM | POA: Diagnosis not present

## 2019-05-11 DIAGNOSIS — Y92410 Unspecified street and highway as the place of occurrence of the external cause: Secondary | ICD-10-CM | POA: Insufficient documentation

## 2019-05-11 DIAGNOSIS — Y939 Activity, unspecified: Secondary | ICD-10-CM | POA: Insufficient documentation

## 2019-05-11 DIAGNOSIS — S199XXA Unspecified injury of neck, initial encounter: Secondary | ICD-10-CM | POA: Diagnosis not present

## 2019-05-11 DIAGNOSIS — J449 Chronic obstructive pulmonary disease, unspecified: Secondary | ICD-10-CM | POA: Diagnosis not present

## 2019-05-11 DIAGNOSIS — S4991XA Unspecified injury of right shoulder and upper arm, initial encounter: Secondary | ICD-10-CM | POA: Diagnosis present

## 2019-05-11 DIAGNOSIS — Z79899 Other long term (current) drug therapy: Secondary | ICD-10-CM | POA: Insufficient documentation

## 2019-05-11 MED ORDER — TIZANIDINE HCL 4 MG PO TABS: 4.0000 mg | ORAL_TABLET | Freq: Three times a day (TID) | ORAL | 0 refills | Status: DC
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
## 2019-05-11 NOTE — Discharge Instructions (Addendum)
Your blood pressure slightly elevated.  Please have this rechecked soon.  The remainder of your vital signs are within normal limits.  Your oxygen level is within normal limits.  Your examination is negative for any acute neurovascular deficits.  Your CT scan suggest degenerative changes involving your cervical spine, but no fracture, or dislocation.  Heating pad to your neck and shoulder area will be helpful.  Please use extra strength Tylenol with breakfast, lunch, dinner, and at bedtime.  Please use Zanaflex 3 times daily for spasm pain. This medication may cause drowsiness. Please do not drink, drive, or participate in activity that requires concentration while taking this medication.  Please see Dr. Moshe Cipro, or return to the emergency department if any changes in your condition, problems, or concerns.
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
## 2019-05-22 ENCOUNTER — Ambulatory Visit (INDEPENDENT_AMBULATORY_CARE_PROVIDER_SITE_OTHER): Payer: Medicare Other | Admitting: Family Medicine

## 2019-05-22 ENCOUNTER — Other Ambulatory Visit: Payer: Self-pay

## 2019-05-22 ENCOUNTER — Encounter: Payer: Self-pay | Admitting: Family Medicine

## 2019-05-22 DIAGNOSIS — I10 Essential (primary) hypertension: Secondary | ICD-10-CM

## 2019-05-22 MED ORDER — METHOCARBAMOL 500 MG PO TABS: ORAL_TABLET | ORAL | 0 refills | Status: DC

## 2019-05-22 MED ORDER — SPIRONOLACTONE 25 MG PO TABS: 25.0000 mg | ORAL_TABLET | Freq: Every day | ORAL | 3 refills | Status: DC

## 2019-05-22 MED ORDER — IBUPROFEN 800 MG PO TABS: 800.0000 mg | ORAL_TABLET | Freq: Three times a day (TID) | ORAL | 0 refills | Status: DC | PRN
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
## 2019-05-22 NOTE — Assessment & Plan Note (Signed)
Uncontrolled, spironolactone added DASH diet and commitment to daily physical activity for a minimum of 30 minutes discussed and encouraged, as a part of hypertension management. The importance of attaining a healthy weight is also discussed.  BP/Weight 05/22/2019 05/11/2019 03/30/2019 01/03/2019 10/05/2018 [id number]  Systolic BP 0000000 123456 123456 123456 [id number]  Diastolic BP 90 90 94 94 76 82 90  Wt. (Lbs) 195 189 192 192.8 191.08 189.12 179  BMI 26.45 25.63 26.04 26.15 25.92 25.65 24.28
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
## 2019-05-26 DIAGNOSIS — M62838 Other muscle spasm: Secondary | ICD-10-CM | POA: Diagnosis not present

## 2019-05-30 ENCOUNTER — Telehealth: Payer: Self-pay | Admitting: *Deleted

## 2019-05-30 NOTE — Telephone Encounter (Signed)
Dr Moshe Cipro gave patient a new bp medication however pt stated he cannot take the medication as it had him so weak he couldn't stand up. He got very dizzy taking it, So he I no longer taking the medication.

## 2019-05-30 NOTE — Telephone Encounter (Signed)
Please advise 

## 2019-05-30 NOTE — Telephone Encounter (Signed)
pls ask if th e spironolactone, which is new is scored and can be broken in half, if it is I advise him to do that. If not , BP will be re evaluated at next visit

## 2019-05-31 NOTE — Telephone Encounter (Signed)
Called patient to ask about medication. No answer, left generic message requesting call back.

## 2019-05-31 NOTE — Telephone Encounter (Signed)
Spoke with patient and spironolactone was not scored. I advised him his bp would be re-evaluated at next visit with verbal understanding.

## 2019-06-07 ENCOUNTER — Ambulatory Visit (HOSPITAL_COMMUNITY): Payer: Medicare Other | Attending: Orthopedic Surgery | Admitting: Physical Therapy

## 2019-06-07 ENCOUNTER — Other Ambulatory Visit: Payer: Self-pay

## 2019-06-07 DIAGNOSIS — M542 Cervicalgia: Secondary | ICD-10-CM | POA: Diagnosis not present
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
## 2019-06-09 ENCOUNTER — Ambulatory Visit (HOSPITAL_COMMUNITY): Payer: Medicare Other

## 2019-06-09 ENCOUNTER — Other Ambulatory Visit: Payer: Self-pay

## 2019-06-09 ENCOUNTER — Encounter (HOSPITAL_COMMUNITY): Payer: Self-pay

## 2019-06-09 DIAGNOSIS — M542 Cervicalgia: Secondary | ICD-10-CM | POA: Diagnosis not present
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
## 2019-06-13 ENCOUNTER — Other Ambulatory Visit: Payer: Self-pay

## 2019-06-13 ENCOUNTER — Encounter (HOSPITAL_COMMUNITY): Payer: Self-pay | Admitting: Physical Therapy

## 2019-06-13 ENCOUNTER — Ambulatory Visit (HOSPITAL_COMMUNITY): Payer: Medicare Other | Admitting: Physical Therapy

## 2019-06-13 DIAGNOSIS — M542 Cervicalgia: Secondary | ICD-10-CM
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
## 2019-06-20 ENCOUNTER — Encounter (HOSPITAL_COMMUNITY): Payer: Self-pay | Admitting: Physical Therapy

## 2019-06-20 ENCOUNTER — Other Ambulatory Visit: Payer: Self-pay

## 2019-06-20 ENCOUNTER — Ambulatory Visit (HOSPITAL_COMMUNITY): Payer: Medicare Other | Attending: Orthopedic Surgery | Admitting: Physical Therapy

## 2019-06-20 DIAGNOSIS — M542 Cervicalgia: Secondary | ICD-10-CM | POA: Insufficient documentation
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
## 2019-06-22 ENCOUNTER — Ambulatory Visit (HOSPITAL_COMMUNITY): Payer: Medicare Other | Admitting: Physical Therapy

## 2019-06-22 ENCOUNTER — Other Ambulatory Visit: Payer: Self-pay

## 2019-06-22 ENCOUNTER — Encounter (HOSPITAL_COMMUNITY): Payer: Self-pay | Admitting: Physical Therapy

## 2019-06-22 DIAGNOSIS — M542 Cervicalgia: Secondary | ICD-10-CM | POA: Diagnosis not present
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
## 2019-06-23 DIAGNOSIS — M62838 Other muscle spasm: Secondary | ICD-10-CM | POA: Diagnosis not present

## 2019-06-27 ENCOUNTER — Other Ambulatory Visit: Payer: Self-pay | Admitting: Family Medicine

## 2019-06-27 ENCOUNTER — Other Ambulatory Visit: Payer: Self-pay

## 2019-06-27 ENCOUNTER — Encounter (HOSPITAL_COMMUNITY): Payer: Self-pay | Admitting: Physical Therapy

## 2019-06-27 ENCOUNTER — Ambulatory Visit (HOSPITAL_COMMUNITY): Payer: Medicare Other | Admitting: Physical Therapy

## 2019-06-27 DIAGNOSIS — M542 Cervicalgia: Secondary | ICD-10-CM
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
## 2019-06-28 ENCOUNTER — Other Ambulatory Visit: Payer: Self-pay | Admitting: Family Medicine

## 2019-06-29 ENCOUNTER — Ambulatory Visit (HOSPITAL_COMMUNITY): Payer: Medicare Other | Admitting: Physical Therapy

## 2019-06-29 ENCOUNTER — Other Ambulatory Visit: Payer: Self-pay

## 2019-06-29 ENCOUNTER — Encounter (HOSPITAL_COMMUNITY): Payer: Self-pay | Admitting: Physical Therapy

## 2019-06-29 DIAGNOSIS — M542 Cervicalgia: Secondary | ICD-10-CM
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
## 2019-07-03 ENCOUNTER — Other Ambulatory Visit: Payer: Self-pay

## 2019-07-03 ENCOUNTER — Telehealth: Payer: Self-pay | Admitting: *Deleted

## 2019-07-03 MED ORDER — SPIRONOLACTONE 25 MG PO TABS: 25.0000 mg | ORAL_TABLET | Freq: Every day | ORAL | 5 refills | Status: DC

## 2019-07-03 MED ORDER — AMLODIPINE BESYLATE 5 MG PO TABS: 5.0000 mg | ORAL_TABLET | Freq: Every day | ORAL | 1 refills | Status: DC

## 2019-07-03 NOTE — Telephone Encounter (Signed)
Pt LVM his medications did not get sent to the pharmacy and he is about out of his blood pressure medication would like this sent in as soon as possible so he doesn't not run out

## 2019-07-03 NOTE — Telephone Encounter (Signed)
Meds sent in to pharmacy (both meds since he didn't say what med it was)

## 2019-07-05 ENCOUNTER — Encounter (HOSPITAL_COMMUNITY): Payer: Self-pay | Admitting: Physical Therapy

## 2019-07-05 ENCOUNTER — Ambulatory Visit (HOSPITAL_COMMUNITY): Payer: Medicare Other | Admitting: Physical Therapy

## 2019-07-05 ENCOUNTER — Other Ambulatory Visit: Payer: Self-pay

## 2019-07-05 DIAGNOSIS — M542 Cervicalgia: Secondary | ICD-10-CM
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
## 2019-07-07 ENCOUNTER — Other Ambulatory Visit: Payer: Self-pay

## 2019-07-07 ENCOUNTER — Encounter (HOSPITAL_COMMUNITY): Payer: Self-pay | Admitting: Physical Therapy

## 2019-07-07 ENCOUNTER — Ambulatory Visit (HOSPITAL_COMMUNITY): Payer: Medicare Other | Admitting: Physical Therapy

## 2019-07-07 DIAGNOSIS — M542 Cervicalgia: Secondary | ICD-10-CM | POA: Diagnosis not present
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
## 2019-07-10 ENCOUNTER — Encounter: Payer: Medicare Other | Admitting: Family Medicine

## 2019-07-11 ENCOUNTER — Other Ambulatory Visit: Payer: Self-pay

## 2019-07-11 ENCOUNTER — Ambulatory Visit (HOSPITAL_COMMUNITY): Payer: Medicare Other | Admitting: Physical Therapy

## 2019-07-11 DIAGNOSIS — M542 Cervicalgia: Secondary | ICD-10-CM | POA: Diagnosis not present
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
## 2019-07-12 DIAGNOSIS — I10 Essential (primary) hypertension: Secondary | ICD-10-CM | POA: Diagnosis not present

## 2019-07-13 LAB — BASIC METABOLIC PANEL WITH GFR
BUN/Creatinine Ratio: 13 (calc) (ref 6–22)
BUN: 16 mg/dL (ref 7–25)
CO2: 28 mmol/L (ref 20–32)
Calcium: 8.4 mg/dL — ABNORMAL LOW (ref 8.6–10.3)
Chloride: 109 mmol/L (ref 98–110)
Creat: 1.26 mg/dL — ABNORMAL HIGH (ref 0.70–1.18)
GFR, Est African American: 67 mL/min/{1.73_m2} (ref >=60)
GFR, Est Non African American: 57 mL/min/{1.73_m2} — ABNORMAL LOW (ref >=60)
Glucose, Bld: 105 mg/dL — ABNORMAL HIGH (ref 65–99)
Potassium: 3.5 mmol/L (ref 3.5–5.3)
Sodium: 145 mmol/L (ref 135–146)

## 2019-07-20 ENCOUNTER — Other Ambulatory Visit: Payer: Self-pay

## 2019-07-20 ENCOUNTER — Ambulatory Visit (INDEPENDENT_AMBULATORY_CARE_PROVIDER_SITE_OTHER): Payer: Medicare Other | Admitting: Family Medicine

## 2019-07-20 VITALS — BP 142/100 | HR 87 | Resp 15 | Ht 72.0 in | Wt 190.0 lb

## 2019-07-20 DIAGNOSIS — I10 Essential (primary) hypertension: Secondary | ICD-10-CM

## 2019-07-20 DIAGNOSIS — Z86010 Personal history of colonic polyps: Secondary | ICD-10-CM | POA: Diagnosis not present

## 2019-07-20 DIAGNOSIS — Z0000 Encounter for general adult medical examination without abnormal findings: Secondary | ICD-10-CM | POA: Diagnosis not present

## 2019-07-20 DIAGNOSIS — Z125 Encounter for screening for malignant neoplasm of prostate: Secondary | ICD-10-CM

## 2019-07-20 DIAGNOSIS — R7301 Impaired fasting glucose: Secondary | ICD-10-CM

## 2019-07-20 DIAGNOSIS — E559 Vitamin D deficiency, unspecified: Secondary | ICD-10-CM

## 2019-07-20 DIAGNOSIS — F1490 Cocaine use, unspecified, uncomplicated: Secondary | ICD-10-CM | POA: Diagnosis not present

## 2019-07-20 DIAGNOSIS — D126 Benign neoplasm of colon, unspecified: Secondary | ICD-10-CM

## 2019-07-20 DIAGNOSIS — Z79899 Other long term (current) drug therapy: Secondary | ICD-10-CM

## 2019-07-20 NOTE — Assessment & Plan Note (Signed)
Counseled re need to get involved in rehab

## 2019-07-20 NOTE — Patient Instructions (Addendum)
F/U in 5 months  with MD re blood pressure, colon screening  Follow up and addiction problems  Please get fasting lipid, hepatic, HBA1C, TSH, PSA, CBC and Vit D January 8 or later  You will get information for rehab, which you need  You need  Your colonoscopy , so please get yourself set up and have this done by March  It is important that you exercise regularly at least 30 minutes 5 times a week. If you develop chest pain, have severe difficulty breathing, or feel very tired, stop exercising immediately and seek medical attention      Think about what you will eat, plan ahead. Choose " clean, green, fresh or frozen" over canned, processed or packaged foods which are more sugary, salty and fatty. 70 to 75% of food eaten should be vegetables and fruit. Three meals at set times with snacks allowed between meals, but they must be fruit or vegetables. Aim to eat over a 12 hour period , example 7 am to 7 pm, and STOP after  your last meal of the day. Drink water,generally about 64 ounces per day, no other drink is as healthy. Fruit juice is best enjoyed in a healthy way, by EATING the fruit.  Thanks for choosing [facility], we consider it a privelige to serve you.

## 2019-07-20 NOTE — Assessment & Plan Note (Signed)

## 2019-07-21 ENCOUNTER — Encounter: Payer: Self-pay | Admitting: Family Medicine

## 2019-07-21 NOTE — Assessment & Plan Note (Signed)
Needs to commit to discontinue cocaine and marijuana so that he can have his colonoscopy which is overdue, I hav again explained this to him, he verbalizes understanding

## 2019-07-21 NOTE — Assessment & Plan Note (Signed)
Uncontrolled , however pt on cocaine and may be elevated due to drug withdrawal DASH diet and commitment to daily physical activity for a minimum of 30 minutes discussed and encouraged, as a part of hypertension management. The importance of attaining a healthy weight is also discussed.  BP/Weight 07/20/2019 05/22/2019 05/11/2019 03/30/2019 01/03/2019 10/05/2018 [id number]  Systolic BP A999333 0000000 123456 123456 123456 [id number]  Diastolic BP 123XX123 90 90 94 94 76 82  Wt. (Lbs) 190.04 195 189 192 192.8 191.08 189.12  BMI 25.77 26.45 25.63 26.04 26.15 25.92 25.65

## 2019-07-21 NOTE — Progress Notes (Signed)
   Marc Ward     MRN: [id number]      DOB: 02/03/1951   HPI: Patient is in for annual physical exam. Uncontrolled hypertension is addressed, likley trigger is withdrawal from drug ( cocaine) Immunization is reviewed , and  updated if needed.    PE; BP (!) 142/100   Pulse 87   Resp 15   Ht 6' (1.829 m)   Wt 190 lb 0.6 oz (86.2 kg)   SpO2 98%   BMI 25.77 kg/m   Pleasant male, alert and oriented x 3, in no cardio-pulmonary distress. Afebrile. HEENT No facial trauma or asymetry. Sinuses non tender. EOMI External ears normal,  Neck: supple, no adenopathy,JVD or thyromegaly.No bruits.  Chest: Clear to ascultation bilaterally.No crackles or wheezes. Non tender to palpation  Cardiovascular system; Heart sounds normal,  S1 and  S2 ,no S3.  No murmur, or thrill. Apical beat not displaced Peripheral pulses normal.  Abdomen: Soft, non tender, no organomegaly or masses. No bruits. Bowel sounds normal. No guarding, tenderness or rebound.    Musculoskeletal exam: Full ROM of spine, hips , shoulders and knees. No deformity ,swelling or crepitus noted. No muscle wasting or atrophy.   Neurologic: Cranial nerves 2 to 12 intact. Power, tone ,sensation and reflexes normal throughout. No disturbance in gait. No tremor.  Skin: Intact, no ulceration, erythema , scaling or rash noted. Pigmentation normal throughout  Psych; Normal mood and affect. Judgement and concentration normal   Assessment & Plan:  Annual physical exam Annual exam as documented. Counseling done  re healthy lifestyle involving commitment to 150 minutes exercise per week, heart healthy diet, and attaining healthy weight.The importance of adequate sleep also discussed. Regular seat belt use and home safety, is also discussed. Changes in health habits are decided on by the patient with goals and time frames  set for achieving them. Immunization and cancer screening needs are specifically addressed at this  visit.   Cocaine use Counseled re need to get involved in rehab  Essential hypertension Uncontrolled , however pt on cocaine and may be elevated due to drug withdrawal DASH diet and commitment to daily physical activity for a minimum of 30 minutes discussed and encouraged, as a part of hypertension management. The importance of attaining a healthy weight is also discussed.  BP/Weight 07/20/2019 05/22/2019 05/11/2019 03/30/2019 01/03/2019 10/05/2018 123456  Systolic BP A999333 0000000 123456 123456 123456 [id number]  Diastolic BP 123XX123 90 90 94 94 76 82  Wt. (Lbs) 190.04 195 189 192 192.8 191.08 189.12  BMI 25.77 26.45 25.63 26.04 26.15 25.92 25.65       TUBULOVILLOUS ADENOMA, COLON Needs to commit to discontinue cocaine and marijuana so that he can have his colonoscopy which is overdue, I hav again explained this to him, he verbalizes understanding

## 2019-07-25 ENCOUNTER — Encounter (INDEPENDENT_AMBULATORY_CARE_PROVIDER_SITE_OTHER): Payer: Medicare Other | Admitting: Ophthalmology

## 2019-07-26 NOTE — Progress Notes (Addendum)
[facility] Note  08/07/2019     CHIEF COMPLAINT Patient presents for Retina Follow Up   HISTORY OF PRESENT ILLNESS: Marc Ward is a 69 y.o. male who presents to the clinic today for:   HPI    Retina Follow Up    Patient presents with  Retinal Break/Detachment.  In right eye.  Severity is moderate.  Duration of 3 months.  Since onset it is stable.  I, the attending physician,  performed the HPI with the patient and updated documentation appropriately.          Comments    Patient states vision the same OU. No new floaters or flashes.        Last edited by Bernarda Caffey, MD on 08/07/2019 10:43 AM. (History)    pt states his vision is doing well, he denies any new flashes or floaters   Referring physician: Debbra Riding, MD [address] [place],  [place] [address]  HISTORICAL INFORMATION:   Selected notes from the MEDICAL RECORD NUMBER Referred by Dr. Wyatt Portela for concern of retinal tear OD LEE:  Ocular Hx- PMH-COPD, HTN, sickle cell trait, substance abuse (cocaine / marijuana)   CURRENT MEDICATIONS: No current outpatient medications on file. (Ophthalmic Drugs)   No current facility-administered medications for this visit. (Ophthalmic Drugs)   Current Outpatient Medications (Other)  Medication Sig  . acetaminophen (TYLENOL) 500 MG tablet Take 1,000 mg by mouth every 6 (six) hours as needed for mild pain.   Marland Kitchen amLODipine (NORVASC) 5 MG tablet Take 1 tablet (5 mg total) by mouth daily.  Marland Kitchen aspirin 81 MG tablet Take 81 mg by mouth daily.  . cholecalciferol (VITAMIN D) 1000 units tablet Take 1,000 Units by mouth daily.  . Fluticasone Furoate-Vilanterol (BREO ELLIPTA IN) Inhale 1 puff into the lungs as needed (shortness of breath).   Marland Kitchen ibuprofen (ADVIL) 800 MG tablet Take 1 tablet (800 mg total) by mouth every 8 (eight) hours as needed.  Marland Kitchen omeprazole (PRILOSEC) 20 MG capsule TAKE 1 CAPSULE BY MOUTH EVERY DAY  . tiZANidine  (ZANAFLEX) 4 MG tablet Take 1 tablet (4 mg total) by mouth 3 (three) times daily.   No current facility-administered medications for this visit. (Other)      REVIEW OF SYSTEMS: ROS    Positive for: Gastrointestinal, HENT, Eyes, Respiratory   Negative for: Constitutional, Neurological, Skin, Genitourinary, Musculoskeletal, Endocrine, Cardiovascular, Psychiatric, Allergic/Imm, Heme/Lymph   Last edited by Roselee Nova D, COT on 08/07/2019  9:58 AM. (History)       ALLERGIES Allergies  Allergen Reactions  . Spironolactone     Nausea     PAST MEDICAL HISTORY Past Medical History:  Diagnosis Date  . Cataract    OU  . COPD (chronic obstructive pulmonary disease) ([place])   . GERD (gastroesophageal reflux disease)   . H. pylori infection FEB 2011   ABO BID x1O DAYS  . History of kidney stones   . Hypertension 2009  . Hypertensive retinopathy    OU  . IDA (iron deficiency anemia)   . Sickle cell trait ([place])   . Substance use disorder    cocaine and marijuana  . Tubulovillous adenoma of colon 2013   currently under close surveillance by GI   Past Surgical History:  Procedure Laterality Date  . ADENOIDECTOMY    . BOWEL RESECTION  11/08/2011   On pathology had a tubulovillous adenoma of the small bowel. Procedure: SMALL BOWEL RESECTION;  Surgeon: Jamesetta So, MD;  Location: [facility];  Service: General;  Laterality: N/A;  . CIRCUMCISION    . COLONOSCOPY  AUG 2010   ADVANCED [place] ADENOMA, TICS,   . COLONOSCOPY WITH ESOPHAGOGASTRODUODENOSCOPY (EGD)  06/2011   Dr. Oneida Alar: Hiatal hernia, moderate gastritis, mild duodenitis (benign small bowel mucosa on path, mild chronic gastritis with intestinal metaplasia but no H. pylori). On colonoscopy scattered diverticulosis, internal hemorrhoids, sessile polyps in the hepatic flexure and cecum, no adenomatous changes on path.  Marland Kitchen LAPAROTOMY  11/08/2011   Procedure: EXPLORATORY LAPAROTOMY;  Surgeon: Jamesetta So, MD;  Location: [facility];   Service: General;  Laterality: N/A;  . TONSILLECTOMY    . UPPER GASTROINTESTINAL ENDOSCOPY  FEB 2011   H. PYLORI    FAMILY HISTORY Family History  Problem Relation Age of Onset  . Hypertension Mother   . Diabetes Father   . Hypertension Brother   . Sickle cell trait Daughter   . Sickle cell trait Son   . Colon cancer Neg Hx     SOCIAL HISTORY Social History   Tobacco Use  . Smoking status: Never Smoker  . Smokeless tobacco: Never Used  Substance Use Topics  . Alcohol use: Yes    Comment: occasional drinker-2 beers/wk  . Drug use: Yes    Frequency: 1.0 times per week    Types: Marijuana, Cocaine    Comment: 01/03/19--last week         OPHTHALMIC EXAM:  Base Eye Exam    Visual Acuity (Snellen - Linear)      Right Left   Dist cc 20/25 -1 20/25 +2   Dist ph cc 20/25 +2 20/20 -1   Correction: Glasses       Tonometry (Tonopen, 10:04 AM)      Right Left   Pressure 23 21       Pupils      Dark Light Shape React APD   Right 3 2 Round Brisk None   Left 3 2 Round Brisk None       Visual Fields (Counting fingers)      Left Right    Full Full       Extraocular Movement      Right Left    Full, Ortho Full, Ortho       Neuro/Psych    Oriented x3: Yes   Mood/Affect: Normal       Dilation    Both eyes: 1.0% Mydriacyl, 2.5% Phenylephrine @ 10:04 AM        Slit Lamp and Fundus Exam    Slit Lamp Exam      Right Left   Lids/Lashes Dermatochalasis - upper lid, Meibomian gland dysfunction Dermatochalasis - upper lid, Meibomian gland dysfunction   Conjunctiva/Sclera mild Melanosis mild Melanosis   Cornea Arcus, 1+ Punctate epithelial erosions Arcus, 1+ Punctate epithelial erosions   Anterior Chamber Deep and quiet Deep and quiet   Iris Round and dilated Round and dilated, focal TID and atrophy from 0300-0400   Lens 2+ Nuclear sclerosis, 2+ Cortical cataract 2+ Nuclear sclerosis, 2+ Cortical cataract   Vitreous Vitreous syneresis Vitreous syneresis        Fundus Exam      Right Left   Disc compact, Pink and Sharp Pink and Sharp   C/D Ratio 0.2 0.3   Macula Flat, Blunted foveal reflex, Drusen, Retinal pigment epithelial mottling, No heme or edema Flat, Blunted foveal reflex, Drusen, Retinal pigment epithelial mottling, No heme or edema  Vessels Mild Vascular attenuation, Tortuousity Mild Vascular attenuation, Tortuousity   Periphery Attached, pigmented VR tuft/retinal break at 1030 -- good laser surrounding, no SRF, mild White without pressure temporally, No new RT/RD Attached             IMAGING AND PROCEDURES  Imaging and Procedures for @TODAY @  OCT, Retina - OU - Both Eyes       Right Eye Quality was good. Central Foveal Thickness: 228. Progression has been stable. Findings include normal foveal contour, no IRF, no SRF.   Left Eye Quality was good. Central Foveal Thickness: 220. Progression has been stable. Findings include normal foveal contour, no IRF, no SRF, retinal drusen .   Notes *Images captured and stored on drive  Diagnosis / Impression:  NFP, no IRF/SRF OU -- stable from prior Drusen OS  Clinical management:  See below  Abbreviations: NFP - Normal foveal profile. CME - cystoid macular edema. PED - pigment epithelial detachment. IRF - intraretinal fluid. SRF - subretinal fluid. EZ - ellipsoid zone. ERM - epiretinal membrane. ORA - outer retinal atrophy. ORT - outer retinal tubulation. SRHM - subretinal hyper-reflective material                 ASSESSMENT/PLAN:    ICD-10-CM   1. Retinal break of right eye  H33.301   2. Retinal edema  H35.81 OCT, Retina - OU - Both Eyes  3. Essential hypertension  I10   4. Hypertensive retinopathy of both eyes  H35.033   5. Combined forms of age-related cataract of both eyes  H25.813     1. Retinal break OD  - pigmented retinal defect located at 67  - s/p laser retinopexy OD (09.09.20) -- good laser in place   - pt is cleared from a retina standpoint for release  to Dr. Wyatt Portela and resumption of primary eye care   2. No retinal edema on exam or OCT  3,4. Hypertensive retinopathy OU  - discussed importance of tight BP control  - monitor  5. Mixed form age related cataract OU  - The symptoms of cataract, surgical options, and treatments and risks were discussed with patient.  - discussed diagnosis and progression  - under the expert management of Dr. Zenia Resides   Ophthalmic Meds Ordered this visit:  No orders of the defined types were placed in this encounter.      Return if symptoms worsen or fail to improve.  There are no Patient Instructions on file for this visit.   Explained the diagnoses, plan, and follow up with the patient and they expressed understanding.  Patient expressed understanding of the importance of proper follow up care.   This document serves as a record of services personally performed by Gardiner Sleeper, MD, PhD. It was created on their behalf by Leeann Must, [place], a certified ophthalmic assistant. The creation of this record is the provider's dictation and/or activities during the visit.    Electronically signed by: Leeann Must, COA @TODAY @ 10:54 AM   This document serves as a record of services personally performed by Gardiner Sleeper, MD, PhD. It was created on their behalf by Ernest Mallick, OA, an ophthalmic assistant. The creation of this record is the provider's dictation and/or activities during the visit.    Electronically signed by: Ernest Mallick, OA 01.18.2021 10:54 AM   Gardiner Sleeper, M.D., Ph.D. Diseases & Surgery of the Retina and [place]     Abbreviations: M  myopia (nearsighted); A astigmatism; H hyperopia (farsighted); P presbyopia; Mrx spectacle prescription;  CTL contact lenses; OD right eye; OS left eye; OU both eyes  XT exotropia; ET esotropia; PEK punctate epithelial keratitis; PEE punctate epithelial erosions; DES dry eye syndrome; MGD meibomian  gland dysfunction; ATs artificial tears; PFAT's preservative free artificial tears; [place] nuclear sclerotic cataract; PSC posterior subcapsular cataract; ERM epi-retinal membrane; PVD posterior vitreous detachment; RD retinal detachment; DM diabetes mellitus; DR diabetic retinopathy; NPDR non-proliferative diabetic retinopathy; PDR proliferative diabetic retinopathy; CSME clinically significant macular edema; DME diabetic macular edema; dbh dot blot hemorrhages; CWS cotton wool spot; POAG primary open angle glaucoma; C/D cup-to-disc ratio; HVF humphrey visual field; GVF goldmann visual field; OCT optical coherence tomography; IOP intraocular pressure; BRVO Branch retinal vein occlusion; CRVO central retinal vein occlusion; CRAO central retinal artery occlusion; BRAO branch retinal artery occlusion; RT retinal tear; SB scleral buckle; PPV pars plana vitrectomy; VH Vitreous hemorrhage; PRP panretinal laser photocoagulation; IVK intravitreal kenalog; VMT vitreomacular traction; MH Macular hole;  NVD neovascularization of the disc; NVE neovascularization elsewhere; AREDS age related eye disease study; ARMD age related macular degeneration; POAG primary open angle glaucoma; EBMD epithelial/anterior basement membrane dystrophy; ACIOL anterior chamber intraocular lens; IOL intraocular lens; PCIOL posterior chamber intraocular lens; Phaco/IOL phacoemulsification with intraocular lens placement; [place] photorefractive keratectomy; LASIK laser assisted in situ keratomileusis; HTN hypertension; DM diabetes mellitus; COPD chronic obstructive pulmonary disease

## 2019-07-28 DIAGNOSIS — S139XXA Sprain of joints and ligaments of unspecified parts of neck, initial encounter: Secondary | ICD-10-CM | POA: Diagnosis not present

## 2019-07-31 DIAGNOSIS — Z79899 Other long term (current) drug therapy: Secondary | ICD-10-CM | POA: Diagnosis not present

## 2019-07-31 DIAGNOSIS — I10 Essential (primary) hypertension: Secondary | ICD-10-CM | POA: Diagnosis not present

## 2019-07-31 DIAGNOSIS — E559 Vitamin D deficiency, unspecified: Secondary | ICD-10-CM | POA: Diagnosis not present

## 2019-07-31 DIAGNOSIS — R7301 Impaired fasting glucose: Secondary | ICD-10-CM | POA: Diagnosis not present

## 2019-08-01 LAB — HEPATIC FUNCTION PANEL
AG Ratio: 1.5 (calc) (ref 1.0–2.5)
ALT: 11 U/L (ref 9–46)
AST: 14 U/L (ref 10–35)
Albumin: 4 g/dL (ref 3.6–5.1)
Alkaline phosphatase (APISO): 57 U/L (ref 35–144)
Bilirubin, Direct: 0.2 mg/dL (ref 0.0–0.2)
Globulin: 2.7 g/dL (calc) (ref 1.9–3.7)
Indirect Bilirubin: 0.6 mg/dL (calc) (ref 0.2–1.2)
Total Bilirubin: 0.8 mg/dL (ref 0.2–1.2)
Total Protein: 6.7 g/dL (ref 6.1–8.1)

## 2019-08-01 LAB — PSA: PSA: 0.5 ng/mL (ref <=4.0)

## 2019-08-01 LAB — LIPID PANEL
Cholesterol: 131 mg/dL (ref <200)
HDL: 37 mg/dL — ABNORMAL LOW (ref >=40)
LDL Cholesterol (Calc): 75 mg/dL (calc)
Non-HDL Cholesterol (Calc): 94 mg/dL (calc) (ref <130)
Total CHOL/HDL Ratio: 3.5 (calc) (ref <5.0)
Triglycerides: 105 mg/dL (ref <150)

## 2019-08-01 LAB — CBC
HCT: 37.6 % — ABNORMAL LOW (ref 38.5–50.0)
Hemoglobin: 12.5 g/dL — ABNORMAL LOW (ref 13.2–17.1)
MCH: 32.3 pg (ref 27.0–33.0)
MCHC: 33.2 g/dL (ref 32.0–36.0)
MCV: 97.2 fL (ref 80.0–100.0)
MPV: 10.1 fL (ref 7.5–12.5)
Platelets: 151 10*3/uL (ref 140–400)
RBC: 3.87 10*6/uL — ABNORMAL LOW (ref 4.20–5.80)
RDW: 12.1 % (ref 11.0–15.0)
WBC: 6.2 10*3/uL (ref 3.8–10.8)

## 2019-08-01 LAB — HEMOGLOBIN A1C
Hgb A1c MFr Bld: 4.8 % of total Hgb (ref <5.7)
Mean Plasma Glucose: 91 (calc)
eAG (mmol/L): 5 (calc)

## 2019-08-01 LAB — TSH: TSH: 3.94 mIU/L (ref 0.40–4.50)

## 2019-08-01 LAB — VITAMIN D 25 HYDROXY (VIT D DEFICIENCY, FRACTURES): Vit D, 25-Hydroxy: 35 ng/mL (ref 30–100)

## 2019-08-07 ENCOUNTER — Encounter (INDEPENDENT_AMBULATORY_CARE_PROVIDER_SITE_OTHER): Payer: Self-pay | Admitting: Ophthalmology

## 2019-08-07 ENCOUNTER — Ambulatory Visit (INDEPENDENT_AMBULATORY_CARE_PROVIDER_SITE_OTHER): Payer: Medicare Other | Admitting: Ophthalmology

## 2019-08-07 DIAGNOSIS — H35033 Hypertensive retinopathy, bilateral: Secondary | ICD-10-CM | POA: Diagnosis not present

## 2019-08-07 DIAGNOSIS — I10 Essential (primary) hypertension: Secondary | ICD-10-CM | POA: Diagnosis not present

## 2019-08-07 DIAGNOSIS — H25813 Combined forms of age-related cataract, bilateral: Secondary | ICD-10-CM | POA: Diagnosis not present

## 2019-08-07 DIAGNOSIS — H3581 Retinal edema: Secondary | ICD-10-CM | POA: Diagnosis not present

## 2019-08-07 DIAGNOSIS — H33301 Unspecified retinal break, right eye: Secondary | ICD-10-CM | POA: Diagnosis not present

## 2019-08-12 NOTE — Progress Notes (Signed)
[facility] Note  08/07/2019     CHIEF COMPLAINT Patient presents for Retina Follow Up   HISTORY OF PRESENT ILLNESS: Marc Ward is a 69 y.o. male who presents to the clinic today for:   HPI    Retina Follow Up    Patient presents with  Retinal Break/Detachment.  In right eye.  Severity is moderate.  Duration of 3 months.  Since onset it is stable.  I, the attending physician,  performed the HPI with the patient and updated documentation appropriately.          Comments    Patient states vision the same OU. No new floaters or flashes.        Last edited by Bernarda Caffey, MD on 08/07/2019 10:43 AM. (History)    pt states his vision is doing well, he denies any new flashes or floaters   Referring physician: Debbra Riding, MD [address] [place],  [place] [address]  HISTORICAL INFORMATION:   Selected notes from the MEDICAL RECORD NUMBER Referred by Dr. Wyatt Portela for concern of retinal tear OD LEE:  Ocular Hx- PMH-COPD, HTN, sickle cell trait, substance abuse (cocaine / marijuana)   CURRENT MEDICATIONS: No current outpatient medications on file. (Ophthalmic Drugs)   No current facility-administered medications for this visit. (Ophthalmic Drugs)   Current Outpatient Medications (Other)  Medication Sig  . acetaminophen (TYLENOL) 500 MG tablet Take 1,000 mg by mouth every 6 (six) hours as needed for mild pain.   Marland Kitchen amLODipine (NORVASC) 5 MG tablet Take 1 tablet (5 mg total) by mouth daily.  Marland Kitchen aspirin 81 MG tablet Take 81 mg by mouth daily.  . cholecalciferol (VITAMIN D) 1000 units tablet Take 1,000 Units by mouth daily.  . Fluticasone Furoate-Vilanterol (BREO ELLIPTA IN) Inhale 1 puff into the lungs as needed (shortness of breath).   Marland Kitchen ibuprofen (ADVIL) 800 MG tablet Take 1 tablet (800 mg total) by mouth every 8 (eight) hours as needed.  Marland Kitchen omeprazole (PRILOSEC) 20 MG capsule TAKE 1 CAPSULE BY MOUTH EVERY DAY  . tiZANidine  (ZANAFLEX) 4 MG tablet Take 1 tablet (4 mg total) by mouth 3 (three) times daily.   No current facility-administered medications for this visit. (Other)      REVIEW OF SYSTEMS: ROS    Positive for: Gastrointestinal, HENT, Eyes, Respiratory   Negative for: Constitutional, Neurological, Skin, Genitourinary, Musculoskeletal, Endocrine, Cardiovascular, Psychiatric, Allergic/Imm, Heme/Lymph   Last edited by Roselee Nova D, COT on 08/07/2019  9:58 AM. (History)       ALLERGIES Allergies  Allergen Reactions  . Spironolactone     Nausea     PAST MEDICAL HISTORY Past Medical History:  Diagnosis Date  . Cataract    OU  . COPD (chronic obstructive pulmonary disease) ([place])   . GERD (gastroesophageal reflux disease)   . H. pylori infection FEB 2011   ABO BID x1O DAYS  . History of kidney stones   . Hypertension 2009  . Hypertensive retinopathy    OU  . IDA (iron deficiency anemia)   . Sickle cell trait ([place])   . Substance use disorder    cocaine and marijuana  . Tubulovillous adenoma of colon 2013   currently under close surveillance by GI   Past Surgical History:  Procedure Laterality Date  . ADENOIDECTOMY    . BOWEL RESECTION  11/08/2011   On pathology had a tubulovillous adenoma of the small bowel. Procedure: SMALL BOWEL RESECTION;  Surgeon: Jamesetta So, MD;  Location: [facility];  Service: General;  Laterality: N/A;  . CIRCUMCISION    . COLONOSCOPY  AUG 2010   ADVANCED [place] ADENOMA, TICS,   . COLONOSCOPY WITH ESOPHAGOGASTRODUODENOSCOPY (EGD)  06/2011   Dr. Oneida Alar: Hiatal hernia, moderate gastritis, mild duodenitis (benign small bowel mucosa on path, mild chronic gastritis with intestinal metaplasia but no H. pylori). On colonoscopy scattered diverticulosis, internal hemorrhoids, sessile polyps in the hepatic flexure and cecum, no adenomatous changes on path.  Marland Kitchen LAPAROTOMY  11/08/2011   Procedure: EXPLORATORY LAPAROTOMY;  Surgeon: Jamesetta So, MD;  Location: [facility];   Service: General;  Laterality: N/A;  . TONSILLECTOMY    . UPPER GASTROINTESTINAL ENDOSCOPY  FEB 2011   H. PYLORI    FAMILY HISTORY Family History  Problem Relation Age of Onset  . Hypertension Mother   . Diabetes Father   . Hypertension Brother   . Sickle cell trait Daughter   . Sickle cell trait Son   . Colon cancer Neg Hx     SOCIAL HISTORY Social History   Tobacco Use  . Smoking status: Never Smoker  . Smokeless tobacco: Never Used  Substance Use Topics  . Alcohol use: Yes    Comment: occasional drinker-2 beers/wk  . Drug use: Yes    Frequency: 1.0 times per week    Types: Marijuana, Cocaine    Comment: 01/03/19--last week         OPHTHALMIC EXAM:  Base Eye Exam    Visual Acuity (Snellen - Linear)      Right Left   Dist cc 20/25 -1 20/25 +2   Dist ph cc 20/25 +2 20/20 -1   Correction: Glasses       Tonometry (Tonopen, 10:04 AM)      Right Left   Pressure 23 21       Pupils      Dark Light Shape React APD   Right 3 2 Round Brisk None   Left 3 2 Round Brisk None       Visual Fields (Counting fingers)      Left Right    Full Full       Extraocular Movement      Right Left    Full, Ortho Full, Ortho       Neuro/Psych    Oriented x3: Yes   Mood/Affect: Normal       Dilation    Both eyes: 1.0% Mydriacyl, 2.5% Phenylephrine @ 10:04 AM        Slit Lamp and Fundus Exam    Slit Lamp Exam      Right Left   Lids/Lashes Dermatochalasis - upper lid, Meibomian gland dysfunction Dermatochalasis - upper lid, Meibomian gland dysfunction   Conjunctiva/Sclera mild Melanosis mild Melanosis   Cornea Arcus, 1+ Punctate epithelial erosions Arcus, 1+ Punctate epithelial erosions   Anterior Chamber Deep and quiet Deep and quiet   Iris Round and dilated Round and dilated, focal TID and atrophy from 0300-0400   Lens 2+ Nuclear sclerosis, 2+ Cortical cataract 2+ Nuclear sclerosis, 2+ Cortical cataract   Vitreous Vitreous syneresis Vitreous syneresis        Fundus Exam      Right Left   Disc compact, Pink and Sharp Pink and Sharp   C/D Ratio 0.2 0.3   Macula Flat, Blunted foveal reflex, Drusen, Retinal pigment epithelial mottling, No heme or edema Flat, Blunted foveal reflex, Drusen, Retinal pigment epithelial mottling, No heme or edema  Vessels Mild Vascular attenuation, Tortuousity Mild Vascular attenuation, Tortuousity   Periphery Attached, pigmented VR tuft/retinal break at 1030 -- good laser surrounding, no SRF, mild White without pressure temporally, No new RT/RD Attached             IMAGING AND PROCEDURES  Imaging and Procedures for @TODAY @  OCT, Retina - OU - Both Eyes       Right Eye Quality was good. Central Foveal Thickness: 228. Progression has been stable. Findings include normal foveal contour, no IRF, no SRF.   Left Eye Quality was good. Central Foveal Thickness: 220. Progression has been stable. Findings include normal foveal contour, no IRF, no SRF, retinal drusen .   Notes *Images captured and stored on drive  Diagnosis / Impression:  NFP, no IRF/SRF OU -- stable from prior Drusen OS  Clinical management:  See below  Abbreviations: NFP - Normal foveal profile. CME - cystoid macular edema. PED - pigment epithelial detachment. IRF - intraretinal fluid. SRF - subretinal fluid. EZ - ellipsoid zone. ERM - epiretinal membrane. ORA - outer retinal atrophy. ORT - outer retinal tubulation. SRHM - subretinal hyper-reflective material                 ASSESSMENT/PLAN:    ICD-10-CM   1. Retinal break of right eye  H33.301   2. Retinal edema  H35.81 OCT, Retina - OU - Both Eyes  3. Essential hypertension  I10   4. Hypertensive retinopathy of both eyes  H35.033   5. Combined forms of age-related cataract of both eyes  H25.813     1. Retinal break OD  - pigmented retinal defect located at 46  - s/p laser retinopexy OD (09.09.20) -- excellent laser in place   - pt is cleared from a retina standpoint for  release to Dr. Wyatt Portela and resumption of primary eye care  2. No retinal edema on exam or OCT  3,4. Hypertensive retinopathy OU  - discussed importance of tight BP control  - monitor  5. Mixed form age related cataract OU  - The symptoms of cataract, surgical options, and treatments and risks were discussed with patient.  - discussed diagnosis and progression  - under the expert management of Dr. Zenia Resides   Ophthalmic Meds Ordered this visit:  No orders of the defined types were placed in this encounter.      Return if symptoms worsen or fail to improve.  There are no Patient Instructions on file for this visit.   Explained the diagnoses, plan, and follow up with the patient and they expressed understanding.  Patient expressed understanding of the importance of proper follow up care.   This document serves as a record of services personally performed by Gardiner Sleeper, MD, PhD. It was created on their behalf by Leeann Must, [place], a certified ophthalmic assistant. The creation of this record is the provider's dictation and/or activities during the visit.    Electronically signed by: Leeann Must, COA @TODAY @ 5:25 PM   This document serves as a record of services personally performed by Gardiner Sleeper, MD, PhD. It was created on their behalf by Ernest Mallick, OA, an ophthalmic assistant. The creation of this record is the provider's dictation and/or activities during the visit.    Electronically signed by: Ernest Mallick, OA 01.18.2021 5:25 PM   Gardiner Sleeper, M.D., Ph.D. Diseases & Surgery of the Retina and Vitreous [facility]   I have reviewed the above  documentation for accuracy and completeness, and I agree with the above. Gardiner Sleeper, M.D., Ph.D. 08/12/19 5:26 PM    Abbreviations: M myopia (nearsighted); A astigmatism; H hyperopia (farsighted); P presbyopia; Mrx spectacle prescription;  CTL contact lenses; OD right eye; OS left  eye; OU both eyes  XT exotropia; ET esotropia; PEK punctate epithelial keratitis; PEE punctate epithelial erosions; DES dry eye syndrome; MGD meibomian gland dysfunction; ATs artificial tears; PFAT's preservative free artificial tears; [place] nuclear sclerotic cataract; PSC posterior subcapsular cataract; ERM epi-retinal membrane; PVD posterior vitreous detachment; RD retinal detachment; DM diabetes mellitus; DR diabetic retinopathy; NPDR non-proliferative diabetic retinopathy; PDR proliferative diabetic retinopathy; CSME clinically significant macular edema; DME diabetic macular edema; dbh dot blot hemorrhages; CWS cotton wool spot; POAG primary open angle glaucoma; C/D cup-to-disc ratio; HVF humphrey visual field; GVF goldmann visual field; OCT optical coherence tomography; IOP intraocular pressure; BRVO Branch retinal vein occlusion; CRVO central retinal vein occlusion; CRAO central retinal artery occlusion; BRAO branch retinal artery occlusion; RT retinal tear; SB scleral buckle; PPV pars plana vitrectomy; VH Vitreous hemorrhage; PRP panretinal laser photocoagulation; IVK intravitreal kenalog; VMT vitreomacular traction; MH Macular hole;  NVD neovascularization of the disc; NVE neovascularization elsewhere; AREDS age related eye disease study; ARMD age related macular degeneration; POAG primary open angle glaucoma; EBMD epithelial/anterior basement membrane dystrophy; ACIOL anterior chamber intraocular lens; IOL intraocular lens; PCIOL posterior chamber intraocular lens; Phaco/IOL phacoemulsification with intraocular lens placement; [place] photorefractive keratectomy; LASIK laser assisted in situ keratomileusis; HTN hypertension; DM diabetes mellitus; COPD chronic obstructive pulmonary disease

## 2019-08-27 ENCOUNTER — Other Ambulatory Visit: Payer: Self-pay

## 2019-08-27 ENCOUNTER — Ambulatory Visit: Payer: Medicare Other | Attending: Internal Medicine

## 2019-08-27 DIAGNOSIS — Z23 Encounter for immunization: Secondary | ICD-10-CM

## 2019-08-27 NOTE — Progress Notes (Signed)
   Covid-19 Vaccination Clinic  Name:  Marc Ward    MRN: [id number] DOB: [birth year]  08/27/2019  Mr. Gerstenberger was observed post Covid-19 immunization for 15 minutes without incidence. He was provided with Vaccine Information Sheet and instruction to access the V-Safe system.   Mr. Josh was instructed to call 911 with any severe reactions post vaccine: Marland Kitchen Difficulty breathing  . Swelling of your face and throat  . A fast heartbeat  . A bad rash all over your body  . Dizziness and weakness    Immunizations Administered    Name Date Dose VIS Date Route   Moderna COVID-19 Vaccine 08/27/2019  4:00 PM 0.5 mL 06/20/2019 Intramuscular   Manufacturer: Moderna   Lot: [id number]   [id number]

## 2019-09-26 ENCOUNTER — Other Ambulatory Visit: Payer: Self-pay | Admitting: Family Medicine

## 2019-09-27 ENCOUNTER — Ambulatory Visit: Payer: Medicare Other | Attending: Internal Medicine

## 2019-09-27 DIAGNOSIS — Z23 Encounter for immunization: Secondary | ICD-10-CM

## 2019-09-27 NOTE — Progress Notes (Signed)
   Covid-19 Vaccination Clinic  Name:  Marc Ward    MRN: [id number] DOB: 29-Mar-1951  09/27/2019  [name] was observed post Covid-19 immunization for 15 minutes without incident. He was provided with Vaccine Information Sheet and instruction to access the V-Safe system.   [name] was instructed to call 911 with any severe reactions post vaccine: Marland Kitchen Difficulty breathing  . Swelling of face and throat  . A fast heartbeat  . A bad rash all over body  . Dizziness and weakness   Immunizations Administered    Name Date Dose VIS Date Route   Moderna COVID-19 Vaccine 09/27/2019  1:56 PM 0.5 mL 06/20/2019 Intramuscular   Manufacturer: Moderna   Lot: [id number]   [id number]

## 2019-12-20 ENCOUNTER — Other Ambulatory Visit: Payer: Self-pay

## 2019-12-20 ENCOUNTER — Ambulatory Visit (INDEPENDENT_AMBULATORY_CARE_PROVIDER_SITE_OTHER): Payer: Medicare Other | Admitting: Family Medicine

## 2019-12-20 ENCOUNTER — Encounter: Payer: Self-pay | Admitting: Family Medicine

## 2019-12-20 VITALS — BP 120/84 | HR 70 | Temp 97.4°F | Resp 15 | Ht 72.0 in | Wt 191.0 lb

## 2019-12-20 DIAGNOSIS — F14288 Cocaine dependence with other cocaine-induced disorder: Secondary | ICD-10-CM

## 2019-12-20 DIAGNOSIS — I10 Essential (primary) hypertension: Secondary | ICD-10-CM

## 2019-12-20 DIAGNOSIS — K219 Gastro-esophageal reflux disease without esophagitis: Secondary | ICD-10-CM | POA: Diagnosis not present

## 2019-12-20 DIAGNOSIS — J449 Chronic obstructive pulmonary disease, unspecified: Secondary | ICD-10-CM

## 2019-12-20 DIAGNOSIS — D126 Benign neoplasm of colon, unspecified: Secondary | ICD-10-CM | POA: Diagnosis not present

## 2019-12-20 DIAGNOSIS — F1420 Cocaine dependence, uncomplicated: Secondary | ICD-10-CM | POA: Insufficient documentation

## 2019-12-20 NOTE — Patient Instructions (Signed)
F/u in office in 4 months, re evaluate blood pressure and addiction, call if you need me before  You NEED to go to rehab for help, and I am thankful that you realize this and intend to follow through  It is important that you exercise regularly at least 30 minutes 5 times a week. If you develop chest pain, have severe difficulty breathing, or feel very tired, stop exercising immediately and seek medical attention   You are referred for colonoscopy  Thanks for choosing [facility], we consider it a privelige to serve you.     Darreld Mclean

## 2019-12-20 NOTE — Assessment & Plan Note (Signed)
counseled re need to quit, encouraged attending rehab, jhe is interested and literature is provided Has been unable to have colonoscopy in the past which he needs due to positive uDS

## 2019-12-22 ENCOUNTER — Encounter: Payer: Self-pay | Admitting: Family Medicine

## 2019-12-22 NOTE — Progress Notes (Signed)
   Marc Ward     MRN: [id number]      DOB: 10/10/1950   HPI Marc Ward is here for follow up and re-evaluation of chronic medical conditions, medication management and review of any available recent lab and radiology data.  Preventive health is updated, specifically  Cancer screening and Immunization.   Questions or concerns regarding consultations or procedures which the PT has had in the interim are  addressed. The PT denies any adverse reactions to current medications since the last visit.  Still dependent on cocaine , uses regularly when he feels stressed or depressed. Not leaving  Home much, no regular exercise Unable to get needed colonoscopy for over 5 years with tubulovillous colon adenoma, due to addiction. States he is interested In rehab as he is aware that unable to control use without help   ROS Denies recent fever or chills. Denies sinus pressure, nasal congestion, ear pain or sore throat. Denies chest congestion, productive cough or wheezing. Denies chest pains, palpitations and leg swelling Denies abdominal pain, nausea, vomiting,diarrhea or constipation.  Denies change in bM or stool caliber Denies dysuria, frequency, hesitancy or incontinence. Denies joint pain, swelling and limitation in mobility. Denies headaches, seizures, numbness, or tingling. Denies skin break down or rash.   PE  BP 120/84   Pulse 70   Temp (!) 97.4 F (36.3 C) (Temporal)   Resp 15   Ht 6' (1.829 m)   Wt 191 lb (86.6 kg)   SpO2 97%   BMI 25.90 kg/m   Patient alert and oriented and in no cardiopulmonary distress.  HEENT: No facial asymmetry, EOMI,     Neck supple .  Chest: Clear to auscultation bilaterally.  CVS: S1, S2 no murmurs, no S3.Regular rate.  ABD: Soft non tender.   Ext: No edema  MS: Adequate ROM spine, shoulders, hips and knees.  Skin: Intact, no ulcerations or rash noted.  Psych: Good eye contact, normal affect. Memory intact not anxious or depressed  appearing.  CNS: CN 2-12 intact, power,  normal throughout.no focal deficits noted.   Assessment & Plan  Cocaine addiction ([place]) counseled re need to quit, encouraged attending rehab, jhe is interested and literature is provided Has been unable to have colonoscopy in the past which he needs due to positive uDS   Essential hypertension Controlled, no change in medication DASH diet and commitment to daily physical activity for a minimum of 30 minutes discussed and encouraged, as a part of hypertension management. The importance of attaining a healthy weight is also discussed.  BP/Weight 12/20/2019 07/20/2019 05/22/2019 05/11/2019 03/30/2019 01/03/2019 6/78/9381  Systolic BP 017 510 258 527 782 423 536  Diastolic BP 84 144 90 90 94 94 76  Wt. (Lbs) 191 190.04 195 189 192 192.8 191.08  BMI 25.9 25.77 26.45 25.63 26.04 26.15 25.92       Cocaine use continues to use regularly, now states he will go for help for the addiction and material provided to contact the facility, I do hope that he follows through  TUBULOVILLOUS ADENOMA, COLON repeat colonoscopy past due and pending medical stability with no cocaine in system at time of study per record review last attempt unsuccessful because of this, pt is and has ben aware of this  COPD mixed type (HCC) Controlled, no change in medication Uses inhaler infrequently  GERD (gastroesophageal reflux disease) Controlled, no change in medication

## 2019-12-22 NOTE — Assessment & Plan Note (Signed)
repeat colonoscopy past due and pending medical stability with no cocaine in system at time of study per record review last attempt unsuccessful because of this, pt is and has ben aware of this

## 2019-12-22 NOTE — Assessment & Plan Note (Signed)
continues to use regularly, now states he will go for help for the addiction and material provided to contact the facility, I do hope that he follows through

## 2019-12-22 NOTE — Assessment & Plan Note (Signed)
Controlled, no change in medication Uses inhaler infrequently

## 2019-12-22 NOTE — Assessment & Plan Note (Signed)
Controlled, no change in medication  

## 2019-12-22 NOTE — Assessment & Plan Note (Signed)
Controlled, no change in medication DASH diet and commitment to daily physical activity for a minimum of 30 minutes discussed and encouraged, as a part of hypertension management. The importance of attaining a healthy weight is also discussed.  BP/Weight 12/20/2019 07/20/2019 05/22/2019 05/11/2019 03/30/2019 01/03/2019 1/95/0932  Systolic BP 671 245 809 983 382 505 397  Diastolic BP 84 673 90 90 94 94 76  Wt. (Lbs) 191 190.04 195 189 192 192.8 191.08  BMI 25.9 25.77 26.45 25.63 26.04 26.15 25.92

## 2019-12-24 ENCOUNTER — Other Ambulatory Visit: Payer: Self-pay | Admitting: Family Medicine

## 2019-12-25 ENCOUNTER — Encounter: Payer: Self-pay | Admitting: Internal Medicine

## 2020-02-28 ENCOUNTER — Telehealth: Payer: Self-pay | Admitting: *Deleted

## 2020-02-28 ENCOUNTER — Ambulatory Visit (INDEPENDENT_AMBULATORY_CARE_PROVIDER_SITE_OTHER): Payer: Medicare Other | Admitting: Gastroenterology

## 2020-02-28 ENCOUNTER — Other Ambulatory Visit: Payer: Self-pay

## 2020-02-28 ENCOUNTER — Encounter: Payer: Self-pay | Admitting: Gastroenterology

## 2020-02-28 ENCOUNTER — Encounter: Payer: Self-pay | Admitting: *Deleted

## 2020-02-28 VITALS — Temp 96.8°F | Ht 72.0 in | Wt 192.2 lb

## 2020-02-28 DIAGNOSIS — D649 Anemia, unspecified: Secondary | ICD-10-CM

## 2020-02-28 DIAGNOSIS — Z86010 Personal history of colonic polyps: Secondary | ICD-10-CM | POA: Diagnosis not present

## 2020-02-28 NOTE — Telephone Encounter (Signed)
LMOVM to schedule TCS with propofol with Dr. Abbey Chatters, ASA 3, UDS pre-op
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
## 2020-02-28 NOTE — Progress Notes (Signed)
Cc'ed to pcp °

## 2020-02-28 NOTE — Telephone Encounter (Signed)
Patient returned call. He is scheduled for procedure 9/21 at 1:00pm. Pt aware will need pre-op/covid test prior. Advised will mail with his prep instructions. Advised per AVS from [facility] he needs to make sure he has stopped cocaine use at least 4 weeks prior to procedure. He voiced understanding. Confirmed mailing address.

## 2020-02-28 NOTE — Patient Instructions (Signed)
1. Colonoscopy to be scheduled.  Please see separate instructions. 2. You are high risk to develop colon cancer, colonoscopy is very important to prevent colon cancer. 3. You need to avoid all cocaine use for 4 weeks prior to procedure.   4. You will need to pass a drug screen during your preop appointment.  If you are positive for cocaine, your procedure will likely be canceled. 5. As discussed today, we recommend stopping cocaine altogether as it is not good for your health.  Consider seeking counseling.

## 2020-03-18 DIAGNOSIS — H31091 Other chorioretinal scars, right eye: Secondary | ICD-10-CM | POA: Diagnosis not present

## 2020-03-18 DIAGNOSIS — H2513 Age-related nuclear cataract, bilateral: Secondary | ICD-10-CM | POA: Diagnosis not present

## 2020-03-18 DIAGNOSIS — H40033 Anatomical narrow angle, bilateral: Secondary | ICD-10-CM | POA: Diagnosis not present

## 2020-03-18 DIAGNOSIS — H33321 Round hole, right eye: Secondary | ICD-10-CM | POA: Diagnosis not present

## 2020-03-20 ENCOUNTER — Other Ambulatory Visit: Payer: Self-pay | Admitting: Family Medicine

## 2020-04-02 ENCOUNTER — Encounter: Payer: Medicare Other | Admitting: Family Medicine

## 2020-04-04 NOTE — Patient Instructions (Signed)
Marc Ward  04/04/2020     @PREFPERIOPPHARMACY @   Your procedure is scheduled on 04/09/2020.  Report to [facility] at 11:30 A.M.  Call this number if you have problems the morning of surgery:  [phone]   Remember:  Do not eat or drink after midnight.    Please follow the prep instructions given to you by Dr. Ave Filter office.    Take these medicines the morning of surgery with A SIP OF WATER : Amlodipine,  Omeprazole and your inhaler    Do not wear jewelry, make-up or nail polish.  Do not wear lotions, powders, or perfumes, or deodorant.  Do not shave 48 hours prior to surgery.  Men may shave face and neck.  Do not bring valuables to the hospital.  [facility] is not responsible for any belongings or valuables.  Contacts, dentures or bridgework may not be worn into surgery.  Leave your suitcase in the car.  After surgery it may be brought to your room.  For patients admitted to the hospital, discharge time will be determined by your treatment team.  Patients discharged the day of surgery will not be allowed to drive home.   Name and phone number of your driver:   family Special instructions:  N/A  Please read over the following fact sheets that you were given. Care and Recovery After Surgery   PATIENT INSTRUCTIONS POST-ANESTHESIA  IMMEDIATELY FOLLOWING SURGERY:  Do not drive or operate machinery for the first twenty four hours after surgery.  Do not make any important decisions for twenty four hours after surgery or while taking narcotic pain medications or sedatives.  If you develop intractable nausea and vomiting or a severe headache please notify your doctor immediately.  FOLLOW-UP:  Please make an appointment with your surgeon as instructed. You do not need to follow up with anesthesia unless specifically instructed to do so.  WOUND CARE INSTRUCTIONS (if applicable):  Keep a dry clean dressing on the anesthesia/puncture wound site if there is drainage.  Once the  wound has quit draining you may leave it open to air.  Generally you should leave the bandage intact for twenty four hours unless there is drainage.  If the epidural site drains for more than 36-48 hours please call the anesthesia department.  QUESTIONS?:  Please feel free to call your physician or the hospital operator if you have any questions, and they will be happy to assist you.       Colonoscopy, Adult A colonoscopy is a procedure to look at the entire large intestine. This procedure is done using a long, thin, flexible tube that has a camera on the end. You may have a colonoscopy:  As a part of normal colorectal screening.  If you have certain symptoms, such as: ? A low number of red blood cells in your blood (anemia). ? Diarrhea that does not go away. ? Pain in your abdomen. ? Blood in your stool. A colonoscopy can help screen for and diagnose medical problems, including:  Tumors.  Extra tissue that grows where mucus forms (polyps).  Inflammation.  Areas of bleeding. Tell your health care provider about:  Any allergies you have.  All medicines you are taking, including vitamins, herbs, eye drops, creams, and over-the-counter medicines.  Any problems you or family members have had with anesthetic medicines.  Any blood disorders you have.  Any surgeries you have had.  Any medical conditions you have.  Any problems you have had with having bowel  movements.  Whether you are pregnant or may be pregnant. What are the risks? Generally, this is a safe procedure. However, problems may occur, including:  Bleeding.  Damage to your intestine.  Allergic reactions to medicines given during the procedure.  Infection. This is rare. What happens before the procedure? Eating and drinking restrictions Follow instructions from your health care provider about eating or drinking restrictions, which may include:  A few days before the procedure: ? Follow a low-fiber  diet. ? Avoid nuts, seeds, dried fruit, raw fruits, and vegetables.  1-3 days before the procedure: ? Eat only gelatin dessert or ice pops. ? Drink only clear liquids, such as water, clear juice, clear broth or bouillon, black coffee or tea, or clear soft drinks or sports drinks. ? Avoid liquids that contain red or purple dye.  The day of the procedure: ? Do not eat solid foods. You may continue to drink clear liquids until up to 2 hours before the procedure. ? Do not eat or drink anything starting 2 hours before the procedure, or within the time period that your health care provider recommends. Bowel prep If you were prescribed a bowel prep to take by mouth (orally) to clean out your colon:  Take it as told by your health care provider. Starting the day before your procedure, you will need to drink a large amount of liquid medicine. The liquid will cause you to have many bowel movements of loose stool until your stool becomes almost clear or light green.  If your skin or the opening between the buttocks (anus) gets irritated from diarrhea, you may relieve the irritation using: ? Wipes with medicine in them, such as adult wet wipes with aloe and vitamin E. ? A product to soothe skin, such as petroleum jelly.  If you vomit while drinking the bowel prep: ? Take a break for up to 60 minutes. ? Begin the bowel prep again. ? Call your health care provider if you keep vomiting or you cannot take the bowel prep without vomiting.  To clean out your colon, you may also be given: ? Laxative medicines. These help you have a bowel movement. ? Instructions for enema use. An enema is liquid medicine injected into your rectum. Medicines Ask your health care provider about:  Changing or stopping your regular medicines or supplements. This is especially important if you are taking iron supplements, diabetes medicines, or blood thinners.  Taking medicines such as aspirin and ibuprofen. These medicines  can thin your blood. Do not take these medicines unless your health care provider tells you to take them.  Taking over-the-counter medicines, vitamins, herbs, and supplements. General instructions  Ask your health care provider what steps will be taken to help prevent infection. These may include washing skin with a germ-killing soap.  Plan to have someone take you home from the hospital or clinic. What happens during the procedure?   An IV will be inserted into one of your veins.  You may be given one or more of the following: ? A medicine to help you relax (sedative). ? A medicine to numb the area (local anesthetic). ? A medicine to make you fall asleep (general anesthetic). This is rarely needed.  You will lie on your side with your knees bent.  The tube will: ? Have oil or gel put on it (be lubricated). ? Be inserted into your anus. ? Be gently eased through all parts of your large intestine.  Air will be sent into  your colon to keep it open. This may cause some pressure or cramping.  Images will be taken with the camera and will appear on a screen.  A small tissue sample may be removed to be looked at under a microscope (biopsy). The tissue may be sent to a lab for testing if any signs of problems are found.  If small polyps are found, they may be removed and checked for cancer cells.  When the procedure is finished, the tube will be removed. The procedure may vary among health care providers and hospitals. What happens after the procedure?  Your blood pressure, heart rate, breathing rate, and blood oxygen level will be monitored until you leave the hospital or clinic.  You may have a small amount of blood in your stool.  You may pass gas and have mild cramping or bloating in your abdomen. This is caused by the air that was used to open your colon during the exam.  Do not drive for 24 hours after the procedure.  It is up to you to get the results of your procedure.  Ask your health care provider, or the department that is doing the procedure, when your results will be ready. Summary  A colonoscopy is a procedure to look at the entire large intestine.  Follow instructions from your health care provider about eating and drinking before the procedure.  If you were prescribed an oral bowel prep to clean out your colon, take it as told by your health care provider.  During the colonoscopy, a flexible tube with a camera on its end is inserted into the anus and then passed into the other parts of the large intestine. This information is not intended to replace advice given to you by your health care provider. Make sure you discuss any questions you have with your health care provider. Document Revised: 01/27/2019 Document Reviewed: 01/27/2019 Elsevier Patient Education  [place].

## 2020-04-08 ENCOUNTER — Encounter (HOSPITAL_COMMUNITY)
Admission: RE | Admit: 2020-04-08 | Discharge: 2020-04-08 | Disposition: A | Discharge status: Home / Self Care | Payer: Medicare Other | Source: Ambulatory Visit | Attending: Internal Medicine | Admitting: Internal Medicine

## 2020-04-08 ENCOUNTER — Other Ambulatory Visit (HOSPITAL_COMMUNITY)
Admission: RE | Admit: 2020-04-08 | Discharge: 2020-04-08 | Disposition: A | Discharge status: Home / Self Care | Payer: Medicare Other | Source: Ambulatory Visit | Attending: Internal Medicine | Admitting: Internal Medicine

## 2020-04-08 ENCOUNTER — Encounter (HOSPITAL_COMMUNITY): Payer: Self-pay

## 2020-04-08 ENCOUNTER — Other Ambulatory Visit: Payer: Self-pay

## 2020-04-08 DIAGNOSIS — Z01812 Encounter for preprocedural laboratory examination: Secondary | ICD-10-CM | POA: Diagnosis not present

## 2020-04-08 DIAGNOSIS — Z20822 Contact with and (suspected) exposure to covid-19: Secondary | ICD-10-CM | POA: Insufficient documentation

## 2020-04-08 DIAGNOSIS — Z01810 Encounter for preprocedural cardiovascular examination: Secondary | ICD-10-CM | POA: Insufficient documentation

## 2020-04-08 LAB — CBC WITH DIFFERENTIAL/PLATELET
Abs Immature Granulocytes: 0.01 10*3/uL (ref 0.00–0.07)
Basophils Absolute: 0 10*3/uL (ref 0.0–0.1)
Basophils Relative: 1 %
Eosinophils Absolute: 0.2 10*3/uL (ref 0.0–0.5)
Eosinophils Relative: 4 %
HCT: 35.6 % — ABNORMAL LOW (ref 39.0–52.0)
Hemoglobin: 11.8 g/dL — ABNORMAL LOW (ref 13.0–17.0)
Immature Granulocytes: 0 %
Lymphocytes Relative: 30 %
Lymphs Abs: 1.8 10*3/uL (ref 0.7–4.0)
MCH: 32.5 pg (ref 26.0–34.0)
MCHC: 33.1 g/dL (ref 30.0–36.0)
MCV: 98.1 fL (ref 80.0–100.0)
Monocytes Absolute: 0.4 10*3/uL (ref 0.1–1.0)
Monocytes Relative: 7 %
Neutro Abs: 3.4 10*3/uL (ref 1.7–7.7)
Neutrophils Relative %: 58 %
Platelets: 133 10*3/uL — ABNORMAL LOW (ref 150–400)
RBC: 3.63 MIL/uL — ABNORMAL LOW (ref 4.22–5.81)
RDW: 12.3 % (ref 11.5–15.5)
WBC: 5.9 10*3/uL (ref 4.0–10.5)
nRBC: 0 % (ref 0.0–0.2)

## 2020-04-08 LAB — RAPID URINE DRUG SCREEN, HOSP PERFORMED
Amphetamines: NOT DETECTED
Barbiturates: NOT DETECTED
Benzodiazepines: NOT DETECTED
Cocaine: NOT DETECTED
Opiates: NOT DETECTED
Tetrahydrocannabinol: NOT DETECTED

## 2020-04-08 LAB — SARS CORONAVIRUS 2 (TAT 6-24 HRS): SARS Coronavirus 2: NEGATIVE

## 2020-04-08 LAB — BASIC METABOLIC PANEL
Anion gap: 8 (ref 5–15)
BUN: 14 mg/dL (ref 8–23)
CO2: 26 mmol/L (ref 22–32)
Calcium: 8.3 mg/dL — ABNORMAL LOW (ref 8.9–10.3)
Chloride: 107 mmol/L (ref 98–111)
Creatinine, Ser: 1.14 mg/dL (ref 0.61–1.24)
GFR calc Af Amer: >60 mL/min (ref >60)
GFR calc non Af Amer: >60 mL/min (ref >60)
Glucose, Bld: 111 mg/dL — ABNORMAL HIGH (ref 70–99)
Potassium: 3.2 mmol/L — ABNORMAL LOW (ref 3.5–5.1)
Sodium: 141 mmol/L (ref 135–145)

## 2020-04-09 ENCOUNTER — Encounter (HOSPITAL_COMMUNITY)
Admission: RE | Disposition: A | Discharge status: Home / Self Care | Payer: Self-pay | Source: Home / Self Care | Attending: Internal Medicine

## 2020-04-09 ENCOUNTER — Ambulatory Visit (HOSPITAL_COMMUNITY): Payer: Medicare Other | Admitting: Anesthesiology

## 2020-04-09 ENCOUNTER — Other Ambulatory Visit: Payer: Self-pay

## 2020-04-09 ENCOUNTER — Encounter (HOSPITAL_COMMUNITY): Payer: Self-pay

## 2020-04-09 ENCOUNTER — Ambulatory Visit (HOSPITAL_COMMUNITY)
Admission: RE | Admit: 2020-04-09 | Discharge: 2020-04-09 | Disposition: A | Discharge status: Home / Self Care | Payer: Medicare Other | Attending: Internal Medicine | Admitting: Internal Medicine

## 2020-04-09 DIAGNOSIS — Z86010 Personal history of colonic polyps: Secondary | ICD-10-CM | POA: Insufficient documentation

## 2020-04-09 DIAGNOSIS — Z833 Family history of diabetes mellitus: Secondary | ICD-10-CM | POA: Diagnosis not present

## 2020-04-09 DIAGNOSIS — D123 Benign neoplasm of transverse colon: Secondary | ICD-10-CM | POA: Insufficient documentation

## 2020-04-09 DIAGNOSIS — Z7951 Long term (current) use of inhaled steroids: Secondary | ICD-10-CM | POA: Diagnosis not present

## 2020-04-09 DIAGNOSIS — Z1211 Encounter for screening for malignant neoplasm of colon: Secondary | ICD-10-CM | POA: Insufficient documentation

## 2020-04-09 DIAGNOSIS — Z886 Allergy status to analgesic agent status: Secondary | ICD-10-CM | POA: Diagnosis not present

## 2020-04-09 DIAGNOSIS — K648 Other hemorrhoids: Secondary | ICD-10-CM | POA: Insufficient documentation

## 2020-04-09 DIAGNOSIS — Z09 Encounter for follow-up examination after completed treatment for conditions other than malignant neoplasm: Secondary | ICD-10-CM | POA: Diagnosis not present

## 2020-04-09 DIAGNOSIS — Z832 Family history of diseases of the blood and blood-forming organs and certain disorders involving the immune mechanism: Secondary | ICD-10-CM | POA: Diagnosis not present

## 2020-04-09 DIAGNOSIS — Z79899 Other long term (current) drug therapy: Secondary | ICD-10-CM | POA: Insufficient documentation

## 2020-04-09 DIAGNOSIS — Z8249 Family history of ischemic heart disease and other diseases of the circulatory system: Secondary | ICD-10-CM | POA: Insufficient documentation

## 2020-04-09 DIAGNOSIS — Z7982 Long term (current) use of aspirin: Secondary | ICD-10-CM | POA: Insufficient documentation

## 2020-04-09 DIAGNOSIS — K219 Gastro-esophageal reflux disease without esophagitis: Secondary | ICD-10-CM | POA: Diagnosis not present

## 2020-04-09 DIAGNOSIS — Z87442 Personal history of urinary calculi: Secondary | ICD-10-CM | POA: Diagnosis not present

## 2020-04-09 DIAGNOSIS — Z9049 Acquired absence of other specified parts of digestive tract: Secondary | ICD-10-CM | POA: Insufficient documentation

## 2020-04-09 DIAGNOSIS — H35033 Hypertensive retinopathy, bilateral: Secondary | ICD-10-CM | POA: Insufficient documentation

## 2020-04-09 DIAGNOSIS — D573 Sickle-cell trait: Secondary | ICD-10-CM | POA: Insufficient documentation

## 2020-04-09 DIAGNOSIS — J449 Chronic obstructive pulmonary disease, unspecified: Secondary | ICD-10-CM | POA: Insufficient documentation

## 2020-04-09 DIAGNOSIS — I10 Essential (primary) hypertension: Secondary | ICD-10-CM | POA: Insufficient documentation

## 2020-04-09 DIAGNOSIS — K5730 Diverticulosis of large intestine without perforation or abscess without bleeding: Secondary | ICD-10-CM | POA: Diagnosis not present

## 2020-04-09 DIAGNOSIS — K635 Polyp of colon: Secondary | ICD-10-CM

## 2020-04-09 HISTORY — PX: COLONOSCOPY WITH PROPOFOL: SHX5780

## 2020-04-09 HISTORY — PX: POLYPECTOMY: SHX5525

## 2020-04-09 SURGERY — COLONOSCOPY WITH PROPOFOL
Anesthesia: General

## 2020-04-09 MED ORDER — CHLORHEXIDINE GLUCONATE CLOTH 2 % EX PADS: 6.0000 | MEDICATED_PAD | Freq: Once | CUTANEOUS | Status: AC

## 2020-04-09 MED ORDER — LACTATED RINGERS IV SOLN: Freq: Once | INTRAVENOUS | Status: AC

## 2020-04-09 MED ORDER — PROPOFOL 10 MG/ML IV BOLUS
INTRAVENOUS | Status: DC | PRN
  Administered 2020-04-09 (×2): 50 mg via INTRAVENOUS

## 2020-04-09 MED ORDER — LIDOCAINE HCL (CARDIAC) PF 100 MG/5ML IV SOSY
PREFILLED_SYRINGE | INTRAVENOUS | Status: DC | PRN
  Administered 2020-04-09: 40 mg via INTRAVENOUS
  Administered 2020-04-09: 60 mg via INTRAVENOUS

## 2020-04-09 MED ORDER — PROPOFOL 500 MG/50ML IV EMUL
INTRAVENOUS | Status: DC | PRN
  Administered 2020-04-09: 100 ug/kg/min via INTRAVENOUS

## 2020-04-09 MED ORDER — PHENYLEPHRINE 40 MCG/ML (10ML) SYRINGE FOR IV PUSH (FOR BLOOD PRESSURE SUPPORT)
PREFILLED_SYRINGE | INTRAVENOUS | Status: DC | PRN
  Administered 2020-04-09 (×2): 80 ug via INTRAVENOUS

## 2020-04-09 MED ORDER — LACTATED RINGERS IV SOLN: INTRAVENOUS | Status: DC | PRN

## 2020-04-09 NOTE — Op Note (Signed)
[facility] Patient Name: Marc Ward Procedure Date: 04/09/2020 11:19 AM MRN: [id number] Date of Birth: 13-Jan-1951 Attending MD: Elon Alas. Abbey Chatters DO CSN: [id number] Age: 69 Admit Type: Outpatient Procedure:                Colonoscopy Indications:              High risk colon cancer surveillance: Personal                            history of colonic polyps Providers:                Elon Alas. Abbey Chatters, DO, Janeece Riggers, RN, Kristine L.                            Risa Grill, Technician, Randa Spike, Merchant [employer] officer Referring MD:              Medicines:                See the Anesthesia note for documentation of the                            administered medications Complications:            No immediate complications. Estimated Blood Loss:     Estimated blood loss was minimal. Procedure:                Pre-Anesthesia Assessment:                           - The anesthesia plan was to use monitored                            anesthesia care (MAC).                           After obtaining informed consent, the colonoscope                            was passed under direct vision. Throughout the                            procedure, the patient's blood pressure, pulse, and                            oxygen saturations were monitored continuously. The                            [id number]-[id number] ([id number]) scope was introduced through                            the anus and advanced to the the cecum, identified                            by the appendiceal orifice, ileocecal valve and                            palpation. The  colonoscopy was performed without                            difficulty. The patient tolerated the procedure                            well. The quality of the bowel preparation was                            evaluated using the BBPS [facility] Bowel Preparation                            Scale) with scores of: Right Colon = 2 (minor                            amount of  residual staining, small fragments of                            stool and/or opaque liquid, but mucosa seen well),                            Transverse Colon = 2 (minor amount of residual                            staining, small fragments of stool and/or opaque                            liquid, but mucosa seen well) and Left Colon = 2                            (minor amount of residual staining, small fragments                            of stool and/or opaque liquid, but mucosa seen                            well). The total BBPS score equals 6. The quality                            of the bowel preparation was fair. Scope In: 11:35:34 AM Scope Out: 11:49:55 AM Scope Withdrawal Time: 0 hours 9 minutes 28 seconds  Total Procedure Duration: 0 hours 14 minutes 21 seconds  Findings:      The perianal and digital rectal examinations were normal.      Non-bleeding internal hemorrhoids were found during endoscopy.      Multiple small-mouthed diverticula were found in the sigmoid colon and       descending colon.      Two sessile polyps were found in the transverse colon. The polyps were 3       to 4 mm in size. These polyps were removed with a cold snare. Resection       and retrieval were complete.      A moderate amount of stool was found in the entire colon, making  visualization difficult. Lavage of the area was performed using copious       amounts, resulting in clearance with fair visualization. Impression:               - Preparation of the colon was fair.                           - Non-bleeding internal hemorrhoids.                           - Diverticulosis in the sigmoid colon and in the                            descending colon.                           - Two 3 to 4 mm polyps in the transverse colon,                            removed with a cold snare. Resected and retrieved.                           - Stool in the entire examined colon. Moderate Sedation:       Per Anesthesia Care Recommendation:           - Patient has a contact number available for                            emergencies. The signs and symptoms of potential                            delayed complications were discussed with the                            patient. Return to normal activities tomorrow.                            Written discharge instructions were provided to the                            patient.                           - Resume previous diet.                           - Continue present medications.                           - Await pathology results.                           - Repeat colonoscopy in 3 years for surveillance                            and borderline colon prep.                           -  Return to GI clinic PRN. Procedure Code(s):        --- Professional ---                           [phone], Colonoscopy, flexible; with removal of                            tumor(s), polyp(s), or other lesion(s) by snare                            technique Diagnosis Code(s):        --- Professional ---                           Z86.010, Personal history of colonic polyps                           K64.8, Other hemorrhoids                           K63.5, Polyp of colon                           K57.30, Diverticulosis of large intestine without                            perforation or abscess without bleeding CPT copyright 2019 American Medical Association. All rights reserved. The codes documented in this report are preliminary and upon coder review may  be revised to meet current compliance requirements. Elon Alas. Abbey Chatters, [facility] Chatters, DO 04/09/2020 11:53:42 AM This report has been signed electronically. Number of Addenda: 0

## 2020-04-09 NOTE — Anesthesia Postprocedure Evaluation (Signed)
Anesthesia Post Note  Patient: Marc Ward  Procedure(s) Performed: COLONOSCOPY WITH PROPOFOL (N/A ) POLYPECTOMY  Anesthesia Type: General Level of consciousness: awake, oriented, awake and alert and patient cooperative Pain management: satisfactory to patient Vital Signs Assessment: post-procedure vital signs reviewed and stable Respiratory status: spontaneous breathing, respiratory function stable and nonlabored ventilation Cardiovascular status: stable Postop Assessment: no apparent nausea or vomiting Anesthetic complications: no   No complications documented.   Last Vitals:  Vitals:   04/09/20 1100 04/09/20 1156  BP: 126/90 (P) 106/62  Pulse: 72   Resp: 12   Temp: 36.7 C (P) 36.4 C  SpO2: 98% (P) 98%    Last Pain:  Vitals:   04/09/20 1100  TempSrc: Oral  PainSc: 0-No pain                 Kimmora Risenhoover

## 2020-04-09 NOTE — Discharge Instructions (Addendum)
Colonoscopy Discharge Instructions  Read the instructions outlined below and refer to this sheet in the next few weeks. These discharge instructions provide you with general information on caring for yourself after you leave the hospital. Your doctor may also give you specific instructions. While your treatment has been planned according to the most current medical practices available, unavoidable complications occasionally occur.   ACTIVITY  You may resume your regular activity, but move at a slower pace for the next 24 hours.   Take frequent rest periods for the next 24 hours.   Walking will help get rid of the air and reduce the bloated feeling in your belly (abdomen).   No driving for 24 hours (because of the medicine (anesthesia) used during the test).    Do not sign any important legal documents or operate any machinery for 24 hours (because of the anesthesia used during the test).  NUTRITION  Drink plenty of fluids.   You may resume your normal diet as instructed by your doctor.   Begin with a light meal and progress to your normal diet. Heavy or fried foods are harder to digest and may make you feel sick to your stomach (nauseated).   Avoid alcoholic beverages for 24 hours or as instructed.  MEDICATIONS  You may resume your normal medications unless your doctor tells you otherwise.  WHAT YOU CAN EXPECT TODAY  Some feelings of bloating in the abdomen.   Passage of more gas than usual.   Spotting of blood in your stool or on the toilet paper.  IF YOU HAD POLYPS REMOVED DURING THE COLONOSCOPY:  No aspirin products for 7 days or as instructed.   No alcohol for 7 days or as instructed.   Eat a soft diet for the next 24 hours.  FINDING OUT THE RESULTS OF YOUR TEST Not all test results are available during your visit. If your test results are not back during the visit, make an appointment with your caregiver to find out the results. Do not assume everything is normal if  you have not heard from your caregiver or the medical facility. It is important for you to follow up on all of your test results.  SEEK IMMEDIATE MEDICAL ATTENTION IF:  You have more than a spotting of blood in your stool.   Your belly is swollen (abdominal distention).   You are nauseated or vomiting.   You have a temperature over 101.   You have abdominal pain or discomfort that is severe or gets worse throughout the day.   I found 2 polyps in your colon which I removed successfully.  Await pathology results, my office will contact you.  I would recommend we repeat colonoscopy in 3 years for surveillance purposes.  Follow-up with GI as needed.  I hope you have a great rest of your week!  Elon Alas. Abbey Chatters, D.O. Gastroenterology and Hepatology [facility]       Colon Polyps  Polyps are tissue growths inside the body. Polyps can grow in many places, including the large intestine (colon). A polyp may be a round bump or a mushroom-shaped growth. You could have one polyp or several. Most colon polyps are noncancerous (benign). However, some colon polyps can become cancerous over time. Finding and removing the polyps early can help prevent this. What are the causes? The exact cause of colon polyps is not known. What increases the risk? You are more likely to develop this condition if you:  Have a family history  of colon cancer or colon polyps.  Are older than 13 or older than 45 if you are African American.  Have inflammatory bowel disease, such as ulcerative colitis or Crohn's disease.  Have certain hereditary conditions, such as: ? Familial adenomatous polyposis. ? Lynch syndrome. ? Turcot syndrome. ? Peutz-Jeghers syndrome.  Are overweight.  Smoke cigarettes.  Do not get enough exercise.  Drink too much alcohol.  Eat a diet that is high in fat and red meat and low in fiber.  Had childhood cancer that was treated with abdominal  radiation. What are the signs or symptoms? Most polyps do not cause symptoms. If you have symptoms, they may include:  Blood coming from your rectum when having a bowel movement.  Blood in your stool. The stool may look dark red or black.  Abdominal pain.  A change in bowel habits, such as constipation or diarrhea. How is this diagnosed? This condition is diagnosed with a colonoscopy. This is a procedure in which a lighted, flexible scope is inserted into the anus and then passed into the colon to examine the area. Polyps are sometimes found when a colonoscopy is done as part of routine cancer screening tests. How is this treated? Treatment for this condition involves removing any polyps that are found. Most polyps can be removed during a colonoscopy. Those polyps will then be tested for cancer. Additional treatment may be needed depending on the results of testing. Follow these instructions at home: Lifestyle  Maintain a healthy weight, or lose weight if recommended by your health care provider.  Exercise every day or as told by your health care provider.  Do not use any products that contain nicotine or tobacco, such as cigarettes and e-cigarettes. If you need help quitting, ask your health care provider.  If you drink alcohol, limit how much you have: ? 0-1 drink a day for women. ? 0-2 drinks a day for men.  Be aware of how much alcohol is in your drink. In the [place] one drink equals one 12 oz bottle of beer (355 mL), one 5 oz glass of wine (148 mL), or one 1 oz shot of hard liquor (44 mL). Eating and drinking   Eat foods that are high in fiber, such as fruits, vegetables, and whole grains.  Eat foods that are high in calcium and vitamin D, such as milk, cheese, yogurt, eggs, liver, fish, and broccoli.  Limit foods that are high in fat, such as fried foods and desserts.  Limit the amount of red meat and processed meat you eat, such as hot dogs, sausage, bacon, and lunch  meats. General instructions  Keep all follow-up visits as told by your health care provider. This is important. ? This includes having regularly scheduled colonoscopies. ? Talk to your health care provider about when you need a colonoscopy. Contact a health care provider if:  You have new or worsening bleeding during a bowel movement.  You have new or increased blood in your stool.  You have a change in bowel habits.  You lose weight for no known reason. Summary  Polyps are tissue growths inside the body. Polyps can grow in many places, including the colon.  Most colon polyps are noncancerous (benign), but some can become cancerous over time.  This condition is diagnosed with a colonoscopy.  Treatment for this condition involves removing any polyps that are found. Most polyps can be removed during a colonoscopy. This information is not intended to replace advice given to you  by your health care provider. Make sure you discuss any questions you have with your health care provider. Document Revised: 10/21/2017 Document Reviewed: 10/21/2017 Elsevier Patient Education  [place] These instructions provide you with information about caring for yourself after your procedure. Your health care provider may also give you more specific instructions. Your treatment has been planned according to current medical practices, but problems sometimes occur. Call your health care provider if you have any problems or questions after your procedure. What can I expect after the procedure? After your procedure, you may:  Feel sleepy for several hours.  Feel clumsy and have poor balance for several hours.  Feel forgetful about what happened after the procedure.  Have poor judgment for several hours.  Feel nauseous or vomit.  Have a sore throat if you had a breathing tube during the procedure. Follow these instructions at home: For at least  24 hours after the procedure:      Have a responsible adult stay with you. It is important to have someone help care for you until you are awake and alert.  Rest as needed.  Do not: ? Participate in activities in which you could fall or become injured. ? Drive. ? Use heavy machinery. ? Drink alcohol. ? Take sleeping pills or medicines that cause drowsiness. ? Make important decisions or sign legal documents. ? Take care of children on your own. Eating and drinking  Follow the diet that is recommended by your health care provider.  If you vomit, drink water, juice, or soup when you can drink without vomiting.  Make sure you have little or no nausea before eating solid foods. General instructions  Take over-the-counter and prescription medicines only as told by your health care provider.  If you have sleep apnea, surgery and certain medicines can increase your risk for breathing problems. Follow instructions from your health care provider about wearing your sleep device: ? Anytime you are sleeping, including during daytime naps. ? While taking prescription pain medicines, sleeping medicines, or medicines that make you drowsy.  If you smoke, do not smoke without supervision.  Keep all follow-up visits as told by your health care provider. This is important. Contact a health care provider if:  You keep feeling nauseous or you keep vomiting.  You feel light-headed.  You develop a rash.  You have a fever. Get help right away if:  You have trouble breathing. Summary  For several hours after your procedure, you may feel sleepy and have poor judgment.  Have a responsible adult stay with you for at least 24 hours or until you are awake and alert. This information is not intended to replace advice given to you by your health care provider. Make sure you discuss any questions you have with your health care provider. Document Revised: 10/04/2017 Document Reviewed:  10/27/2015 Elsevier Patient Education  [place].

## 2020-04-09 NOTE — Transfer of Care (Signed)
Immediate Anesthesia Transfer of Care Note  Patient: Marc Ward  Procedure(s) Performed: COLONOSCOPY WITH PROPOFOL (N/A ) POLYPECTOMY  Patient Location: PACU  Anesthesia Type:General  Level of Consciousness: awake, alert , oriented and patient cooperative  Airway & Oxygen Therapy: Patient Spontanous Breathing  Post-op Assessment: Report given to RN, Post -op Vital signs reviewed and stable and Patient moving all extremities X 4  Post vital signs: Reviewed and stable  Last Vitals:  Vitals Value Taken Time  BP    Temp    Pulse    Resp 20 04/09/20 1156  SpO2    Vitals shown include unvalidated device data.  Last Pain:  Vitals:   04/09/20 1100  TempSrc: Oral  PainSc: 0-No pain         Complications: No complications documented.
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
## 2020-04-09 NOTE — Anesthesia Preprocedure Evaluation (Signed)
Anesthesia Evaluation  Patient identified by MRN, date of birth, ID band Patient awake    Reviewed: Allergy & Precautions, NPO status , Patient's Chart, lab work & pertinent test results  History of Anesthesia Complications Negative for: history of anesthetic complications  Airway Mallampati: III  TM Distance: >3 FB Neck ROM: Full    Dental  (+) Dental Advisory Given, Caps, Missing   Pulmonary COPD,  COPD inhaler,    Pulmonary exam normal breath sounds clear to auscultation       Cardiovascular Exercise Tolerance: Good hypertension, Pt. on medications Normal cardiovascular exam Rhythm:Regular Rate:Normal     Neuro/Psych negative neurological ROS  negative psych ROS   GI/Hepatic GERD  Medicated and Controlled,(+)     substance abuse (last used cocaine - one month ago, marijuana - 2 months ago)  cocaine use and marijuana use,   Endo/Other  negative endocrine ROS  Renal/GU negative Renal ROS  negative genitourinary   Musculoskeletal   Abdominal   Peds negative pediatric ROS (+)  Hematology  (+) anemia ,   Anesthesia Other Findings   Reproductive/Obstetrics                            Anesthesia Physical Anesthesia Plan  ASA: III  Anesthesia Plan: General   Post-op Pain Management:    Induction: Intravenous  PONV Risk Score and Plan: TIVA  Airway Management Planned: Nasal Cannula and Natural Airway  Additional Equipment:   Intra-op Plan:   Post-operative Plan:   Informed Consent: I have reviewed the patients History and Physical, chart, labs and discussed the procedure including the risks, benefits and alternatives for the proposed anesthesia with the patient or authorized representative who has indicated his/her understanding and acceptance.     Dental advisory given  Plan Discussed with: CRNA and Surgeon  Anesthesia Plan Comments:         Anesthesia Quick  Evaluation

## 2020-04-10 LAB — SURGICAL PATHOLOGY

## 2020-04-15 ENCOUNTER — Ambulatory Visit (INDEPENDENT_AMBULATORY_CARE_PROVIDER_SITE_OTHER): Payer: Medicare Other

## 2020-04-15 ENCOUNTER — Other Ambulatory Visit: Payer: Self-pay

## 2020-04-15 ENCOUNTER — Encounter (HOSPITAL_COMMUNITY): Payer: Self-pay | Admitting: Internal Medicine

## 2020-04-15 VITALS — BP 130/82 | Ht 72.0 in | Wt 194.0 lb

## 2020-04-15 DIAGNOSIS — Z0000 Encounter for general adult medical examination without abnormal findings: Secondary | ICD-10-CM | POA: Diagnosis not present

## 2020-04-15 DIAGNOSIS — Z23 Encounter for immunization: Secondary | ICD-10-CM | POA: Diagnosis not present

## 2020-04-15 NOTE — Patient Instructions (Signed)
[name] , Thank you for taking time to come for your Medicare Wellness Visit. I appreciate your ongoing commitment to your health goals. Please review the following plan we discussed and let me know if I can assist you in the future.   Screening recommendations/referrals: Colonoscopy: due 2024 Lung cancer screening- doesn't qualify  Recommended yearly ophthalmology/optometry visit for glaucoma screening and checkup Recommended yearly dental visit for hygiene and checkup  Vaccinations: Influenza vaccine: received today Pneumococcal vaccine: up to date  Tdap vaccine: up to date  Shingles vaccine: completed        Next appointment: wellness in 1 year   Preventive Care 35 Years and Older, Male Preventive care refers to lifestyle choices and visits with your health care provider that can promote health and wellness. What does preventive care include?  A yearly physical exam. This is also called an annual well check.  Dental exams once or twice a year.  Routine eye exams. Ask your health care provider how often you should have your eyes checked.  Personal lifestyle choices, including:  Daily care of your teeth and gums.  Regular physical activity.  Eating a healthy diet.  Avoiding tobacco and drug use.  Limiting alcohol use.  Practicing safe sex.  Taking low-dose aspirin every day.  Taking vitamin and mineral supplements as recommended by your health care provider. What happens during an annual well check? The services and screenings done by your health care provider during your annual well check will depend on your age, overall health, lifestyle risk factors, and family history of disease. Counseling  Your health care provider may ask you questions about your:  Alcohol use.  Tobacco use.  Drug use.  Emotional well-being.  Home and relationship well-being.  Sexual activity.  Eating habits.  History of falls.  Memory and ability to understand  (cognition).  Work and work Statistician.  Reproductive health. Screening  You may have the following tests or measurements:  Height, weight, and BMI.  Blood pressure.  Lipid and cholesterol levels. These may be checked every 5 years, or more frequently if you are over 76 years old.  Skin check.  Lung cancer screening. You may have this screening every year starting at age 77 if you have a 30-pack-year history of smoking and currently smoke or have quit within the past 15 years.  Fecal occult blood test (FOBT) of the stool. You may have this test every year starting at age 56.  Flexible sigmoidoscopy or colonoscopy. You may have a sigmoidoscopy every 5 years or a colonoscopy every 10 years starting at age 77.  Hepatitis C blood test.  Hepatitis B blood test.  Sexually transmitted disease (STD) testing.  Diabetes screening. This is done by checking your blood sugar (glucose) after you have not eaten for a while (fasting). You may have this done every 1-3 years.  Bone density scan. This is done to screen for osteoporosis. You may have this done starting at age 82.  Mammogram. This may be done every 1-2 years. Talk to your health care provider about how often you should have regular mammograms. Talk with your health care provider about your test results, treatment options, and if necessary, the need for more tests. Vaccines  Your health care provider may recommend certain vaccines, such as:  Influenza vaccine. This is recommended every year.  Tetanus, diphtheria, and acellular pertussis (Tdap, Td) vaccine. You may need a Td booster every 10 years.  Zoster vaccine. You may need this after age  60.  Pneumococcal 13-valent conjugate (PCV13) vaccine. One dose is recommended after age 57.  Pneumococcal polysaccharide (PPSV23) vaccine. One dose is recommended after age 17. Talk to your health care provider about which screenings and vaccines you need and how often you need  them. This information is not intended to replace advice given to you by your health care provider. Make sure you discuss any questions you have with your health care provider. Document Released: 08/02/2015 Document Revised: 03/25/2016 Document Reviewed: 05/07/2015 Elsevier Interactive Patient Education  2017 [place] Prevention in the Home Falls can cause injuries. They can happen to people of all ages. There are many things you can do to make your home safe and to help prevent falls. What can I do on the outside of my home?  Regularly fix the edges of walkways and driveways and fix any cracks.  Remove anything that might make you trip as you walk through a door, such as a raised step or threshold.  Trim any bushes or trees on the path to your home.  Use bright outdoor lighting.  Clear any walking paths of anything that might make someone trip, such as rocks or tools.  Regularly check to see if handrails are loose or broken. Make sure that both sides of any steps have handrails.  Any raised decks and porches should have guardrails on the edges.  Have any leaves, snow, or ice cleared regularly.  Use sand or salt on walking paths during winter.  Clean up any spills in your garage right away. This includes oil or grease spills. What can I do in the bathroom?  Use night lights.  Install grab bars by the toilet and in the tub and shower. Do not use towel bars as grab bars.  Use non-skid mats or decals in the tub or shower.  If you need to sit down in the shower, use a plastic, non-slip stool.  Keep the floor dry. Clean up any water that spills on the floor as soon as it happens.  Remove soap buildup in the tub or shower regularly.  Attach bath mats securely with double-sided non-slip rug tape.  Do not have throw rugs and other things on the floor that can make you trip. What can I do in the bedroom?  Use night lights.  Make sure that you have a light by your  bed that is easy to reach.  Do not use any sheets or blankets that are too big for your bed. They should not hang down onto the floor.  Have a firm chair that has side arms. You can use this for support while you get dressed.  Do not have throw rugs and other things on the floor that can make you trip. What can I do in the kitchen?  Clean up any spills right away.  Avoid walking on wet floors.  Keep items that you use a lot in easy-to-reach places.  If you need to reach something above you, use a strong step stool that has a grab bar.  Keep electrical cords out of the way.  Do not use floor polish or wax that makes floors slippery. If you must use wax, use non-skid floor wax.  Do not have throw rugs and other things on the floor that can make you trip. What can I do with my stairs?  Do not leave any items on the stairs.  Make sure that there are handrails on both sides of the stairs and  use them. Fix handrails that are broken or loose. Make sure that handrails are as long as the stairways.  Check any carpeting to make sure that it is firmly attached to the stairs. Fix any carpet that is loose or worn.  Avoid having throw rugs at the top or bottom of the stairs. If you do have throw rugs, attach them to the floor with carpet tape.  Make sure that you have a light switch at the top of the stairs and the bottom of the stairs. If you do not have them, ask someone to add them for you. What else can I do to help prevent falls?  Wear shoes that:  Do not have high heels.  Have rubber bottoms.  Are comfortable and fit you well.  Are closed at the toe. Do not wear sandals.  If you use a stepladder:  Make sure that it is fully opened. Do not climb a closed stepladder.  Make sure that both sides of the stepladder are locked into place.  Ask someone to hold it for you, if possible.  Clearly mark and make sure that you can see:  Any grab bars or handrails.  First and last  steps.  Where the edge of each step is.  Use tools that help you move around (mobility aids) if they are needed. These include:  Canes.  Walkers.  Scooters.  Crutches.  Turn on the lights when you go into a dark area. Replace any light bulbs as soon as they burn out.  Set up your furniture so you have a clear path. Avoid moving your furniture around.  If any of your floors are uneven, fix them.  If there are any pets around you, be aware of where they are.  Review your medicines with your doctor. Some medicines can make you feel dizzy. This can increase your chance of falling. Ask your doctor what other things that you can do to help prevent falls. This information is not intended to replace advice given to you by your health care provider. Make sure you discuss any questions you have with your health care provider. Document Released: 05/02/2009 Document Revised: 12/12/2015 Document Reviewed: 08/10/2014 Elsevier Interactive Patient Education  2017 [employer].
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
## 2020-04-30 ENCOUNTER — Ambulatory Visit: Payer: Medicare Other | Admitting: Family Medicine

## 2020-05-13 ENCOUNTER — Other Ambulatory Visit: Payer: Self-pay

## 2020-05-13 ENCOUNTER — Ambulatory Visit (INDEPENDENT_AMBULATORY_CARE_PROVIDER_SITE_OTHER): Payer: Medicare Other | Admitting: Family Medicine

## 2020-05-13 ENCOUNTER — Encounter: Payer: Self-pay | Admitting: Family Medicine

## 2020-05-13 DIAGNOSIS — I10 Essential (primary) hypertension: Secondary | ICD-10-CM

## 2020-05-13 DIAGNOSIS — K219 Gastro-esophageal reflux disease without esophagitis: Secondary | ICD-10-CM | POA: Diagnosis not present

## 2020-05-13 DIAGNOSIS — F321 Major depressive disorder, single episode, moderate: Secondary | ICD-10-CM | POA: Insufficient documentation

## 2020-05-13 MED ORDER — ESCITALOPRAM OXALATE 10 MG PO TABS: 10.0000 mg | ORAL_TABLET | Freq: Every day | ORAL | 2 refills | Status: DC

## 2020-05-13 NOTE — Assessment & Plan Note (Signed)
Controlled, no change in medication  

## 2020-05-13 NOTE — Assessment & Plan Note (Signed)
Not at goal, no med change, re eval in 8 weeks DASH diet and commitment to daily physical activity for a minimum of 30 minutes discussed and encouraged, as a part of hypertension management. The importance of attaining a healthy weight is also discussed.  BP/Weight 05/13/2020 04/15/2020 04/09/2020 04/08/2020 02/28/2020 12/20/2019 82/41/7530  Systolic BP 104 045 913 685 - 992 341  Diastolic BP 92 82 89 443 - 84 100  Wt. (Lbs) 192.04 194 - 190 192.2 191 190.04  BMI 26.05 26.31 - 25.77 26.07 25.9 25.77

## 2020-05-13 NOTE — Progress Notes (Signed)
   My [place]     MRN: [id number]      DOB: [birth year]   HPI Mr. Pacitti is here for follow up and re-evaluation of chronic medical conditions, medication management and review of any available recent lab and radiology data.  Preventive health is updated, specifically  Cancer screening and Immunization.   Questions or concerns regarding consultations or procedures which the PT has had in the interim are  addressed. The PT denies any adverse reactions to current medications since the last visit.  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 Denies sinus pressure, nasal congestion, ear pain or sore throat. Denies chest congestion, productive cough or wheezing. Denies chest pains, palpitations and leg swelling Denies abdominal pain, nausea, vomiting,diarrhea or constipation.   Denies dysuria, frequency, hesitancy or incontinence. Denies joint pain, swelling and limitation in mobility. Denies headaches, seizures, numbness, or tingling.  Denies skin break down or rash.   PE  BP (!) 132/92   Pulse 74   Resp 16   Ht 6' (1.829 m)   Wt 192 lb 0.6 oz (87.1 kg)   SpO2 95%   BMI 26.05 kg/m   Patient alert and oriented and in no cardiopulmonary distress.  HEENT: No facial asymmetry, EOMI,     Neck supple .  Chest: Clear to auscultation bilaterally.  CVS: S1, S2 no murmurs, no S3.Regular rate.  ABD: Soft non tender.   Ext: No edema  MS: Adequate ROM spine, shoulders, hips and knees.  Skin: Intact, no ulcerations or rash noted.  Psych: Good eye contact, flat  affect. Memory intact not anxious but  depressed appearing.  CNS: CN 2-12 intact, power,  normal throughout.no focal deficits noted.   Assessment & Plan  Essential hypertension Not at goal, no med change, re eval in 8 weeks DASH diet and commitment to daily physical activity for a  minimum of 30 minutes discussed and encouraged, as a part of hypertension management. The importance of attaining a healthy weight is also discussed.  BP/Weight 05/13/2020 04/15/2020 04/09/2020 04/08/2020 02/28/2020 12/20/2019 67/07/4101  Systolic BP 013 143 888 757 - 972 820  Diastolic BP 92 82 89 601 - 84 100  Wt. (Lbs) 192.04 194 - 190 192.2 191 190.04  BMI 26.05 26.31 - 25.77 26.07 25.9 25.77       GERD (gastroesophageal reflux disease) Controlled, no change in medication   Depression, major, single episode, moderate (HCC) Start daily lexapro and re eval in approx weeks No interest in or indication for therapy currently

## 2020-05-13 NOTE — Assessment & Plan Note (Signed)
Start daily lexapro and re eval in approx weeks No interest in or indication for therapy currently

## 2020-05-13 NOTE — Patient Instructions (Addendum)
Annual physical exam  In office  with MD 2nd week in January, call if you need me sooner  Re evaluate BP an depression  New for depression is lexapro one daily  Please get TdAP at your pharmacy, it is due  Try to work on executing the dream of sharing your gift of music  It is important that you exercise regularly at least 30 minutes 5 times a week. If you develop chest pain, have severe difficulty breathing, or feel very tired, stop exercising immediately and seek medical attention    Think about what you will eat, plan ahead. Choose " clean, green, fresh or frozen" over canned, processed or packaged foods which are more sugary, salty and fatty. 70 to 75% of food eaten should be vegetables and fruit. Three meals at set times with snacks allowed between meals, but they must be fruit or vegetables. Aim to eat over a 12 hour period , example 7 am to 7 pm, and STOP after  your last meal of the day. Drink water,generally about 64 ounces per day, no other drink is as healthy. Fruit juice is best enjoyed in a healthy way, by EATING the fruit. Thanks for choosing [facility], we consider it a privelige to serve you.

## 2020-06-05 ENCOUNTER — Other Ambulatory Visit: Payer: Self-pay | Admitting: Family Medicine

## 2020-08-01 ENCOUNTER — Ambulatory Visit (INDEPENDENT_AMBULATORY_CARE_PROVIDER_SITE_OTHER): Payer: Medicare HMO | Admitting: Nurse Practitioner

## 2020-08-01 ENCOUNTER — Other Ambulatory Visit: Payer: Self-pay

## 2020-08-01 ENCOUNTER — Encounter: Payer: Self-pay | Admitting: Nurse Practitioner

## 2020-08-01 VITALS — BP 133/69 | HR 63 | Temp 97.8°F | Resp 18 | Ht 72.0 in | Wt 193.0 lb

## 2020-08-01 DIAGNOSIS — J449 Chronic obstructive pulmonary disease, unspecified: Secondary | ICD-10-CM | POA: Diagnosis not present

## 2020-08-01 DIAGNOSIS — Z0000 Encounter for general adult medical examination without abnormal findings: Secondary | ICD-10-CM

## 2020-08-01 DIAGNOSIS — H6121 Impacted cerumen, right ear: Secondary | ICD-10-CM | POA: Diagnosis not present

## 2020-08-01 DIAGNOSIS — I499 Cardiac arrhythmia, unspecified: Secondary | ICD-10-CM

## 2020-08-01 NOTE — Assessment & Plan Note (Signed)
-  no issues today -Continue Breo daily -if his symptoms get worse, would consider adding an albuterol inhaler

## 2020-08-01 NOTE — Assessment & Plan Note (Signed)
-  asymptomatic today -after irrigation, TMs intact with bony landmarks present; no sing of infection

## 2020-08-01 NOTE — Assessment & Plan Note (Addendum)
-  irregular rhythm heard on exam -EKG performed today shows sinus rhythm with premature supraventricular compexes -he is asymptomatic -he has hx of cocaine use and hypertension; also had abnormal ECG by Dr. Moshe Cipro in 2016 that demonstrated sinus bradycardia, heart rate 47 bpm, with a nonspecific T-wave abnormality in lead V4 and T-wave inversions in leads V5 and V6 -he was supposed to have stress test; recored show he was hospitalized in 2017 and had EKG and echo then; echo unremarkable and EKG was same as previous
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
## 2020-08-01 NOTE — Patient Instructions (Signed)
You were seen today for an annual physical exam.  We irrigated your right ear and performed an EKG for an irregular heart rhythm. The EKG was similar to EKGs in the past so no new medications or referrals are required. If you have any new symptoms, please follow-up with our office.  We will draw routine labs today and call you with results. We'll see you back in about 6 months unless you needs anything sooner.

## 2020-08-02 LAB — CBC WITH DIFFERENTIAL/PLATELET
Basophils Absolute: 0.1 10*3/uL (ref 0.0–0.2)
Basos: 1 %
EOS (ABSOLUTE): 0.3 10*3/uL (ref 0.0–0.4)
Eos: 5 %
Hematocrit: 37 % — ABNORMAL LOW (ref 37.5–51.0)
Hemoglobin: 12.3 g/dL — ABNORMAL LOW (ref 13.0–17.7)
Immature Grans (Abs): 0 10*3/uL (ref 0.0–0.1)
Immature Granulocytes: 0 %
Lymphocytes Absolute: 1.6 10*3/uL (ref 0.7–3.1)
Lymphs: 30 %
MCH: 32.5 pg (ref 26.6–33.0)
MCHC: 33.2 g/dL (ref 31.5–35.7)
MCV: 98 fL — ABNORMAL HIGH (ref 79–97)
Monocytes Absolute: 0.5 10*3/uL (ref 0.1–0.9)
Monocytes: 9 %
Neutrophils Absolute: 2.9 10*3/uL (ref 1.4–7.0)
Neutrophils: 55 %
Platelets: 155 10*3/uL (ref 150–450)
RBC: 3.78 x10E6/uL — ABNORMAL LOW (ref 4.14–5.80)
RDW: 12.9 % (ref 11.6–15.4)
WBC: 5.3 10*3/uL (ref 3.4–10.8)

## 2020-08-02 LAB — CMP14+EGFR
ALT: 10 IU/L (ref 0–44)
AST: 14 IU/L (ref 0–40)
Albumin/Globulin Ratio: 1.4 (ref 1.2–2.2)
Albumin: 3.8 g/dL (ref 3.7–4.7)
Alkaline Phosphatase: 59 IU/L (ref 44–121)
BUN/Creatinine Ratio: 8 — ABNORMAL LOW (ref 10–24)
BUN: 10 mg/dL (ref 8–27)
Bilirubin Total: 0.6 mg/dL (ref 0.0–1.2)
CO2: 26 mmol/L (ref 20–29)
Calcium: 8.5 mg/dL — ABNORMAL LOW (ref 8.6–10.2)
Chloride: 108 mmol/L — ABNORMAL HIGH (ref 96–106)
Creatinine, Ser: 1.19 mg/dL (ref 0.76–1.27)
GFR calc Af Amer: 71 mL/min/{1.73_m2} (ref >59)
GFR calc non Af Amer: 61 mL/min/{1.73_m2} (ref >59)
Globulin, Total: 2.7 g/dL (ref 1.5–4.5)
Glucose: 75 mg/dL (ref 65–99)
Potassium: 3.7 mmol/L (ref 3.5–5.2)
Sodium: 147 mmol/L — ABNORMAL HIGH (ref 134–144)
Total Protein: 6.5 g/dL (ref 6.0–8.5)

## 2020-08-02 LAB — LIPID PANEL WITH LDL/HDL RATIO
Cholesterol, Total: 141 mg/dL (ref 100–199)
HDL: 39 mg/dL — ABNORMAL LOW (ref >39)
LDL Chol Calc (NIH): 81 mg/dL (ref 0–99)
LDL/HDL Ratio: 2.1 ratio (ref 0.0–3.6)
Triglycerides: 115 mg/dL (ref 0–149)
VLDL Cholesterol Cal: 21 mg/dL (ref 5–40)

## 2020-08-02 NOTE — Progress Notes (Signed)
Labs look great.  Sodium and chloride are just a tad high, so try to cut back on table salt. Calcium is also a little low, so you can take an OTC calcium supplement to keep your bones strong.

## 2020-08-05 ENCOUNTER — Telehealth: Payer: Self-pay

## 2020-08-05 NOTE — Telephone Encounter (Signed)
Pt is returning call for lab results  

## 2020-08-08 NOTE — Telephone Encounter (Signed)
Marc Ward has ben trying to call the patient for his lab results

## 2020-08-15 ENCOUNTER — Telehealth (INDEPENDENT_AMBULATORY_CARE_PROVIDER_SITE_OTHER): Payer: Medicare HMO | Admitting: Nurse Practitioner

## 2020-08-15 ENCOUNTER — Other Ambulatory Visit: Payer: Self-pay

## 2020-08-15 ENCOUNTER — Encounter: Payer: Self-pay | Admitting: Nurse Practitioner

## 2020-08-15 DIAGNOSIS — U071 COVID-19: Secondary | ICD-10-CM | POA: Insufficient documentation

## 2020-08-15 MED ORDER — NOREL AD 4-10-325 MG PO TABS: 1.0000 | ORAL_TABLET | ORAL | 1 refills | Status: DC | PRN
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
## 2020-08-15 NOTE — Assessment & Plan Note (Signed)
-  mild symptoms, just nasal congestion -Rx. norel -we discussed stopping quarantine after symptoms have been gone for 24 hours without needing medication
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
## 2020-09-19 ENCOUNTER — Other Ambulatory Visit: Payer: Self-pay | Admitting: Family Medicine

## 2020-12-21 ENCOUNTER — Other Ambulatory Visit: Payer: Self-pay | Admitting: Family Medicine

## 2021-01-29 ENCOUNTER — Ambulatory Visit (INDEPENDENT_AMBULATORY_CARE_PROVIDER_SITE_OTHER): Payer: Medicare Other | Admitting: Family Medicine

## 2021-01-29 ENCOUNTER — Other Ambulatory Visit: Payer: Self-pay

## 2021-01-29 ENCOUNTER — Encounter: Payer: Self-pay | Admitting: Family Medicine

## 2021-01-29 VITALS — BP 138/87 | HR 64 | Temp 98.3°F | Resp 20 | Ht 72.0 in | Wt 178.0 lb

## 2021-01-29 DIAGNOSIS — I10 Essential (primary) hypertension: Secondary | ICD-10-CM

## 2021-01-29 DIAGNOSIS — E559 Vitamin D deficiency, unspecified: Secondary | ICD-10-CM

## 2021-01-29 DIAGNOSIS — Z125 Encounter for screening for malignant neoplasm of prostate: Secondary | ICD-10-CM | POA: Diagnosis not present

## 2021-01-29 DIAGNOSIS — J449 Chronic obstructive pulmonary disease, unspecified: Secondary | ICD-10-CM

## 2021-01-29 DIAGNOSIS — F14288 Cocaine dependence with other cocaine-induced disorder: Secondary | ICD-10-CM

## 2021-01-29 NOTE — Progress Notes (Signed)
   Marc Ward     MRN: [id number]      DOB: Feb 04, 1951   HPI Marc Ward is here for follow up and re-evaluation of chronic medical conditions, medication management and review of any available recent lab and radiology data.  Preventive health is updated, specifically  Cancer screening and Immunization.   Questions or concerns regarding consultations or procedures which the PT has had in the interim are  addressed. The PT denies any adverse reactions to current medications since the last visit.  There are no new concerns.  There are no specific complaints   ROS Denies recent fever or chills. Denies sinus pressure, nasal congestion, ear pain or sore throat. Denies chest congestion, productive cough or wheezing. Denies chest pains, palpitations and leg swelling Denies abdominal pain, nausea, vomiting,diarrhea or constipation.   Denies dysuria, frequency, hesitancy or incontinence. Denies joint pain, swelling and limitation in mobility. Denies headaches, seizures, numbness, or tingling. Denies depression, anxiety or insomnia. Denies skin break down or rash.   PE  BP 138/87   Pulse 64   Temp 98.3 F (36.8 C)   Resp 20   Ht 6' (1.829 m)   Wt 178 lb (80.7 kg)   SpO2 93%   BMI 24.14 kg/m   Patient alert and oriented and in no cardiopulmonary distress.  HEENT: No facial asymmetry, EOMI,     Neck supple .  Chest: Clear to auscultation bilaterally.  CVS: S1, S2 no murmurs, no S3.Regular rate.  ABD: Soft non tender.   Ext: No edema  MS: Adequate ROM spine, shoulders, hips and knees.  Skin: Intact, no ulcerations or rash noted.  Psych: Good eye contact, normal affect. Memory intact not anxious or depressed appearing.  CNS: CN 2-12 intact, power,  normal throughout.no focal deficits noted.   Assessment & Plan  Essential hypertension Controlled, no change in medication DASH diet and commitment to daily physical activity for a minimum of 30 minutes discussed and  encouraged, as a part of hypertension management. The importance of attaining a healthy weight is also discussed.  BP/Weight 01/29/2021 08/15/2020 08/01/2020 05/13/2020 04/15/2020 04/09/2020 0/17/7939  Systolic BP 030 - 092 330 076 226 333  Diastolic BP 87 - 69 92 82 89 103  Wt. (Lbs) 178 190 193 192.04 194 - 190  BMI 24.14 25.77 26.18 26.05 26.31 - 25.77       COPD mixed type (HCC) No recent flares uses inhaler on avg twice per month  Cocaine addiction (HCC) Unchanged, counseled to quit

## 2021-01-29 NOTE — Assessment & Plan Note (Signed)
Unchanged, counseled to quit

## 2021-01-29 NOTE — Assessment & Plan Note (Signed)
No recent flares uses inhaler on avg twice per month

## 2021-01-29 NOTE — Patient Instructions (Addendum)
Annual exam early January, call if you need me sooner  Call and come for flu vaccine early September  Labs today, chem 7 andd EGFR, TSH, PSA, CbC and vitD  It is important that you exercise regularly at least 30 minutes 5 times a week. If you develop chest pain, have severe difficulty breathing, or feel very tired, stop exercising immediately and seek medical attention  Think about what you will eat, plan ahead. Choose " clean, green, fresh or frozen" over canned, processed or packaged foods which are more sugary, salty and fatty. 70 to 75% of food eaten should be vegetables and fruit. Three meals at set times with snacks allowed between meals, but they must be fruit or vegetables. Aim to eat over a 12 hour period , example 7 am to 7 pm, and STOP after  your last meal of the day. Drink water,generally about 64 ounces per day, no other drink is as healthy. Fruit juice is best enjoyed in a healthy way, by EATING the fruit.  Thanks for choosing [facility], we consider it a privelige to serve you.

## 2021-01-29 NOTE — Assessment & Plan Note (Signed)
Controlled, no change in medication DASH diet and commitment to daily physical activity for a minimum of 30 minutes discussed and encouraged, as a part of hypertension management. The importance of attaining a healthy weight is also discussed.  BP/Weight 01/29/2021 08/15/2020 08/01/2020 05/13/2020 04/15/2020 04/09/2020 08/07/1476  Systolic BP 295 - 621 308 657 846 962  Diastolic BP 87 - 69 92 82 89 103  Wt. (Lbs) 178 190 193 192.04 194 - 190  BMI 24.14 25.77 26.18 26.05 26.31 - 25.77

## 2021-01-31 LAB — CBC
Hematocrit: 34.2 % — ABNORMAL LOW (ref 37.5–51.0)
Hemoglobin: 11.3 g/dL — ABNORMAL LOW (ref 13.0–17.7)
MCH: 32.3 pg (ref 26.6–33.0)
MCHC: 33 g/dL (ref 31.5–35.7)
MCV: 98 fL — ABNORMAL HIGH (ref 79–97)
Platelets: 182 10*3/uL (ref 150–450)
RBC: 3.5 x10E6/uL — ABNORMAL LOW (ref 4.14–5.80)
RDW: 12.6 % (ref 11.6–15.4)
WBC: 6.9 10*3/uL (ref 3.4–10.8)

## 2021-01-31 LAB — TSH: TSH: 2.39 u[IU]/mL (ref 0.450–4.500)

## 2021-01-31 LAB — BMP8+EGFR
BUN/Creatinine Ratio: 12 (ref 10–24)
BUN: 14 mg/dL (ref 8–27)
CO2: 23 mmol/L (ref 20–29)
Calcium: 8.3 mg/dL — ABNORMAL LOW (ref 8.6–10.2)
Chloride: 105 mmol/L (ref 96–106)
Creatinine, Ser: 1.15 mg/dL (ref 0.76–1.27)
Glucose: 91 mg/dL (ref 65–99)
Potassium: 3.4 mmol/L — ABNORMAL LOW (ref 3.5–5.2)
Sodium: 143 mmol/L (ref 134–144)
eGFR: 68 mL/min/{1.73_m2} (ref >59)

## 2021-01-31 LAB — PSA: Prostate Specific Ag, Serum: 1.2 ng/mL (ref 0.0–4.0)

## 2021-01-31 LAB — VITAMIN D 25 HYDROXY (VIT D DEFICIENCY, FRACTURES): Vit D, 25-Hydroxy: 35.7 ng/mL (ref 30.0–100.0)

## 2021-03-19 DIAGNOSIS — H40033 Anatomical narrow angle, bilateral: Secondary | ICD-10-CM | POA: Diagnosis not present

## 2021-03-19 DIAGNOSIS — H33321 Round hole, right eye: Secondary | ICD-10-CM | POA: Diagnosis not present

## 2021-03-19 DIAGNOSIS — H31091 Other chorioretinal scars, right eye: Secondary | ICD-10-CM | POA: Diagnosis not present

## 2021-03-19 DIAGNOSIS — H2513 Age-related nuclear cataract, bilateral: Secondary | ICD-10-CM | POA: Diagnosis not present

## 2021-03-21 ENCOUNTER — Other Ambulatory Visit: Payer: Self-pay | Admitting: Family Medicine

## 2021-04-16 ENCOUNTER — Other Ambulatory Visit: Payer: Self-pay

## 2021-04-16 ENCOUNTER — Ambulatory Visit (INDEPENDENT_AMBULATORY_CARE_PROVIDER_SITE_OTHER): Payer: Medicare Other

## 2021-04-16 DIAGNOSIS — Z0000 Encounter for general adult medical examination without abnormal findings: Secondary | ICD-10-CM
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
## 2021-07-22 ENCOUNTER — Other Ambulatory Visit: Payer: Self-pay

## 2021-07-22 ENCOUNTER — Encounter: Payer: Self-pay | Admitting: Family Medicine

## 2021-07-22 ENCOUNTER — Ambulatory Visit (INDEPENDENT_AMBULATORY_CARE_PROVIDER_SITE_OTHER): Payer: Commercial Managed Care - HMO | Admitting: Family Medicine

## 2021-07-22 VITALS — BP 130/86 | HR 65 | Ht 72.0 in | Wt 172.1 lb

## 2021-07-22 DIAGNOSIS — Z0000 Encounter for general adult medical examination without abnormal findings: Secondary | ICD-10-CM | POA: Diagnosis not present

## 2021-07-22 DIAGNOSIS — I10 Essential (primary) hypertension: Secondary | ICD-10-CM

## 2021-07-22 DIAGNOSIS — Z13220 Encounter for screening for lipoid disorders: Secondary | ICD-10-CM

## 2021-07-22 NOTE — Addendum Note (Signed)
Addended by: Eual Fines on: 07/22/2021 11:39 AM   Modules accepted: Orders

## 2021-07-22 NOTE — Assessment & Plan Note (Signed)

## 2021-07-22 NOTE — Progress Notes (Signed)
° °  Marc Ward     MRN: [id number]      DOB: 07/07/1951   HPI: Patient is in for annual physical exam. No other health concerns are expressed or addressed at the visit. Recent labs, if available are reviewed. Immunization is reviewed , and  updated if needed.    PE; BP 130/86    Pulse 65    Ht 6' (1.829 m)    Wt 172 lb 1.3 oz (78.1 kg)    SpO2 94%    BMI 23.34 kg/m   Pleasant male, alert and oriented x 3, in no cardio-pulmonary distress. Afebrile. HEENT No facial trauma or asymetry. Sinuses non tender. EOMI External ears normal,  Neck: supple, no adenopathy,JVD or thyromegaly.No bruits.  Chest: Clear to ascultation bilaterally.No crackles or wheezes. Non tender to palpation  Cardiovascular system; Heart sounds normal,  S1 and  S2 ,no S3.  No murmur, or thrill. Apical beat not displaced Peripheral pulses normal.  Abdomen: Soft, non tender, no organomegaly or masses. No bruits. Bowel sounds normal. No guarding, tenderness or rebound.    Musculoskeletal exam: Full ROM of spine, hips , shoulders and knees. No deformity ,swelling or crepitus noted. No muscle wasting or atrophy.   Neurologic: Cranial nerves 2 to 12 intact. Power, tone ,sensation and reflexes normal throughout. No disturbance in gait. No tremor.  Skin: Intact, no ulceration, erythema , scaling or rash noted. Pigmentation normal throughout  Psych; Normal mood and affect. Judgement and concentration normal   Assessment & Plan:  Annual physical exam Annual exam as documented. Counseling done  re healthy lifestyle involving commitment to 150 minutes exercise per week, heart healthy diet, and attaining healthy weight.The importance of adequate sleep also discussed. Regular seat belt use and home safety, is also discussed. Changes in health habits are decided on by the patient with goals and time frames  set for achieving them. Immunization and cancer screening needs are specifically addressed at  this visit.

## 2021-07-22 NOTE — Patient Instructions (Addendum)
Annual exam in 366 days  F/u in 6 months  Fasting lipid, cmp and EGFr and cBC this week please  It is important that you exercise regularly at least 30 minutes 5 times a week. If you develop chest pain, have severe difficulty breathing, or feel very tired, stop exercising immediately and seek medical attention   Thanks for choosing [facility], we consider it a privelige to serve you.   Nurse please obtain and document covid booster done at [place] and flu vaccine at [facility] or [facility] per pt report

## 2021-07-24 DIAGNOSIS — I10 Essential (primary) hypertension: Secondary | ICD-10-CM | POA: Diagnosis not present

## 2021-07-24 DIAGNOSIS — Z13220 Encounter for screening for lipoid disorders: Secondary | ICD-10-CM | POA: Diagnosis not present

## 2021-07-25 LAB — CMP14+EGFR
ALT: 13 IU/L (ref 0–44)
AST: 15 IU/L (ref 0–40)
Albumin/Globulin Ratio: 1.7 (ref 1.2–2.2)
Albumin: 3.9 g/dL (ref 3.7–4.7)
Alkaline Phosphatase: 53 IU/L (ref 44–121)
BUN/Creatinine Ratio: 13 (ref 10–24)
BUN: 15 mg/dL (ref 8–27)
Bilirubin Total: 0.7 mg/dL (ref 0.0–1.2)
CO2: 24 mmol/L (ref 20–29)
Calcium: 8.4 mg/dL — ABNORMAL LOW (ref 8.6–10.2)
Chloride: 108 mmol/L — ABNORMAL HIGH (ref 96–106)
Creatinine, Ser: 1.12 mg/dL (ref 0.76–1.27)
Globulin, Total: 2.3 g/dL (ref 1.5–4.5)
Glucose: 95 mg/dL (ref 70–99)
Potassium: 3.5 mmol/L (ref 3.5–5.2)
Sodium: 147 mmol/L — ABNORMAL HIGH (ref 134–144)
Total Protein: 6.2 g/dL (ref 6.0–8.5)
eGFR: 70 mL/min/{1.73_m2} (ref >59)

## 2021-07-25 LAB — CBC
Hematocrit: 35.7 % — ABNORMAL LOW (ref 37.5–51.0)
Hemoglobin: 11.7 g/dL — ABNORMAL LOW (ref 13.0–17.7)
MCH: 32.1 pg (ref 26.6–33.0)
MCHC: 32.8 g/dL (ref 31.5–35.7)
MCV: 98 fL — ABNORMAL HIGH (ref 79–97)
Platelets: 151 10*3/uL (ref 150–450)
RBC: 3.65 x10E6/uL — ABNORMAL LOW (ref 4.14–5.80)
RDW: 12.7 % (ref 11.6–15.4)
WBC: 6 10*3/uL (ref 3.4–10.8)

## 2021-07-25 LAB — LIPID PANEL
Chol/HDL Ratio: 2.9 ratio (ref 0.0–5.0)
Cholesterol, Total: 120 mg/dL (ref 100–199)
HDL: 42 mg/dL (ref >39)
LDL Chol Calc (NIH): 62 mg/dL (ref 0–99)
Triglycerides: 79 mg/dL (ref 0–149)
VLDL Cholesterol Cal: 16 mg/dL (ref 5–40)

## 2021-09-19 ENCOUNTER — Other Ambulatory Visit: Payer: Self-pay | Admitting: Family Medicine

## 2021-12-18 ENCOUNTER — Other Ambulatory Visit: Payer: Self-pay | Admitting: Family Medicine

## 2022-01-27 ENCOUNTER — Encounter: Payer: Self-pay | Admitting: Family Medicine

## 2022-01-27 ENCOUNTER — Ambulatory Visit (INDEPENDENT_AMBULATORY_CARE_PROVIDER_SITE_OTHER): Payer: Medicare Other | Admitting: Family Medicine

## 2022-01-27 VITALS — BP 144/88 | HR 63 | Ht 72.0 in | Wt 170.0 lb

## 2022-01-27 DIAGNOSIS — Z125 Encounter for screening for malignant neoplasm of prostate: Secondary | ICD-10-CM

## 2022-01-27 DIAGNOSIS — E559 Vitamin D deficiency, unspecified: Secondary | ICD-10-CM | POA: Diagnosis not present

## 2022-01-27 DIAGNOSIS — I10 Essential (primary) hypertension: Secondary | ICD-10-CM | POA: Diagnosis not present

## 2022-01-27 MED ORDER — AMLODIPINE BESYLATE 10 MG PO TABS: 10.0000 mg | ORAL_TABLET | Freq: Every day | ORAL | 5 refills | Status: DC

## 2022-01-27 NOTE — Progress Notes (Signed)
   Marc Ward     MRN: [id number]      DOB: 1950-09-19   HPI Marc Ward is here for follow up and re-evaluation of chronic medical conditions, medication management and review of any available recent lab and radiology data.  Preventive health is updated, specifically  Cancer screening and Immunization.   Questions or concerns regarding consultations or procedures which the PT has had in the interim are  addressed. The PT denies any adverse reactions to current medications since the last visit.  There are no new concerns.  There are no specific complaints   ROS Denies recent fever or chills. Denies sinus pressure, nasal congestion, ear pain or sore throat. Denies chest congestion, productive cough or wheezing. Denies chest pains, palpitations and leg swelling Denies abdominal pain, nausea, vomiting,diarrhea or constipation.   Denies dysuria, frequency, hesitancy or incontinence. Denies joint pain, swelling and limitation in mobility. Denies headaches, seizures, numbness, or tingling. Denies depression, anxiety or insomnia. Denies skin break down or rash.   PE  BP (!) 144/88   Pulse 63   Ht 6' (1.829 m)   Wt 170 lb 0.6 oz (77.1 kg)   SpO2 96%   BMI 23.06 kg/m   Patient alert and oriented and in no cardiopulmonary distress.  HEENT: No facial asymmetry, EOMI,     Neck supple .  Chest: Clear to auscultation bilaterally.  CVS: S1, S2 no murmurs, no S3.Regular rate.  ABD: Soft non tender.   Ext: No edema  MS: Adequate ROM spine, shoulders, hips and knees.  Skin: Intact, no ulcerations or rash noted.  Psych: Good eye contact, normal affect. Memory intact not anxious or depressed appearing.  CNS: CN 2-12 intact, power,  normal throughout.no focal deficits noted.   Assessment & Plan  Essential hypertension Uncontrolled increase amlodipine to 10 mg daily, re eval in 5 weeks DASH diet and commitment to daily physical activity for a minimum of 30 minutes discussed and  encouraged, as a part of hypertension management. The importance of attaining a healthy weight is also discussed.     01/27/2022   11:20 AM 01/27/2022   10:51 AM 01/27/2022   10:47 AM 07/22/2021   11:10 AM 07/22/2021   10:49 AM 01/29/2021   11:40 AM 01/29/2021   11:05 AM  BP/Weight  Systolic BP 893 810 175 102 585 277 824  Diastolic BP 88 86 96 86 90 87 87  Wt. (Lbs)   170.04  172.08  178  BMI   23.06 kg/m2  23.34 kg/m2  24.14 kg/m2

## 2022-01-27 NOTE — Patient Instructions (Signed)
Annual exam and re eval BP end August, call if you need me sooner  New higher dose of amlodipine is 10 mg one daily, please collect todday and start tomorrow. BP is still too high  You need a covid booster , check the pharmacy  Labs today, chem 7 and eGFR, PSA, TSH and vit D  It is important that you exercise regularly at least 30 minutes 5 times a week. If you develop chest pain, have severe difficulty breathing, or feel very tired, stop exercising immediately and seek medical attention   Think about what you will eat, plan ahead. Choose " clean, green, fresh or frozen" over canned, processed or packaged foods which are more sugary, salty and fatty. 70 to 75% of food eaten should be vegetables and fruit. Three meals at set times with snacks allowed between meals, but they must be fruit or vegetables. Aim to eat over a 12 hour period , example 7 am to 7 pm, and STOP after  your last meal of the day. Drink water,generally about 64 ounces per day, no other drink is as healthy. Fruit juice is best enjoyed in a healthy way, by EATING the fruit. Thanks for choosing [facility], we consider it a privelige to serve you.

## 2022-01-27 NOTE — Assessment & Plan Note (Signed)
Uncontrolled increase amlodipine to 10 mg daily, re eval in 5 weeks DASH diet and commitment to daily physical activity for a minimum of 30 minutes discussed and encouraged, as a part of hypertension management. The importance of attaining a healthy weight is also discussed.     01/27/2022   11:20 AM 01/27/2022   10:51 AM 01/27/2022   10:47 AM 07/22/2021   11:10 AM 07/22/2021   10:49 AM 01/29/2021   11:40 AM 01/29/2021   11:05 AM  BP/Weight  Systolic BP 583 094 076 [phone] 594  Diastolic BP 88 86 96 86 90 87 87  Wt. (Lbs)   170.04  172.08  178  BMI   23.06 kg/m2  23.34 kg/m2  24.14 kg/m2

## 2022-01-28 LAB — BMP8+EGFR
BUN/Creatinine Ratio: 12 (ref 10–24)
BUN: 14 mg/dL (ref 8–27)
CO2: 25 mmol/L (ref 20–29)
Calcium: 8.4 mg/dL — ABNORMAL LOW (ref 8.6–10.2)
Chloride: 107 mmol/L — ABNORMAL HIGH (ref 96–106)
Creatinine, Ser: 1.21 mg/dL (ref 0.76–1.27)
Glucose: 104 mg/dL — ABNORMAL HIGH (ref 70–99)
Potassium: 3.6 mmol/L (ref 3.5–5.2)
Sodium: 145 mmol/L — ABNORMAL HIGH (ref 134–144)
eGFR: 64 mL/min/{1.73_m2} (ref >59)

## 2022-01-28 LAB — TSH: TSH: 5.26 u[IU]/mL — ABNORMAL HIGH (ref 0.450–4.500)

## 2022-01-28 LAB — VITAMIN D 25 HYDROXY (VIT D DEFICIENCY, FRACTURES): Vit D, 25-Hydroxy: 39.4 ng/mL (ref 30.0–100.0)

## 2022-01-28 LAB — PSA: Prostate Specific Ag, Serum: 1 ng/mL (ref 0.0–4.0)

## 2022-03-13 ENCOUNTER — Ambulatory Visit (INDEPENDENT_AMBULATORY_CARE_PROVIDER_SITE_OTHER): Payer: Medicare Other | Admitting: Family Medicine

## 2022-03-13 ENCOUNTER — Encounter: Payer: Self-pay | Admitting: Family Medicine

## 2022-03-13 VITALS — BP 144/86 | HR 64 | Ht 72.0 in | Wt 171.0 lb

## 2022-03-13 DIAGNOSIS — I10 Essential (primary) hypertension: Secondary | ICD-10-CM

## 2022-03-13 MED ORDER — OLMESARTAN MEDOXOMIL 20 MG PO TABS
20.0000 mg | ORAL_TABLET | Freq: Every day | ORAL | 3 refills | Status: DC
Start: 2022-03-13 — End: 2022-05-22

## 2022-03-13 NOTE — Progress Notes (Unsigned)
   Marc Ward     MRN: [id number]      DOB: 11-16-1950   HPI Marc Ward is here for follow up and re-evaluation of chronic medical conditions, medication management and review of any available recent lab and radiology data.  Preventive health is updated, specifically  Cancer screening and Immunization.   Questions or concerns regarding consultations or procedures which the PT has had in the interim are  addressed. The PT denies any adverse reactions to current medications since the last visit.  There are no new concerns.  There are no specific complaints   ROS Denies recent fever or chills. Denies sinus pressure, nasal congestion, ear pain or sore throat. Denies chest congestion, productive cough or wheezing. Denies chest pains, palpitations and leg swelling Denies abdominal pain, nausea, vomiting,diarrhea or constipation.   Denies dysuria, frequency, hesitancy or incontinence. Denies joint pain, swelling and limitation in mobility. Denies headaches, seizures, numbness, or tingling. Denies depression, anxiety or insomnia. Denies skin break down or rash.   PE  BP (!) 144/86   Pulse 64   Ht 6' (1.829 m)   Wt 171 lb (77.6 kg)   SpO2 95%   BMI 23.19 kg/m   Patient alert and oriented and in no cardiopulmonary distress.  HEENT: No facial asymmetry, EOMI,     Neck supple .  Chest: Clear to auscultation bilaterally.  CVS: S1, S2 no murmurs, no S3.Regular rate.  ABD: Soft non tender.   Ext: No edema  MS: Adequate ROM spine, shoulders, hips and knees.  Skin: Intact, no ulcerations or rash noted.  Psych: Good eye contact, normal affect. Memory intact not anxious or depressed appearing.  CNS: CN 2-12 intact, power,  normal throughout.no focal deficits noted.   Assessment & Plan  Essential hypertension Uncontrolled , add benicar 20 mg daily DASH diet and commitment to daily physical activity for a minimum of 30 minutes discussed and encouraged, as a part of hypertension  management. The importance of attaining a healthy weight is also discussed.     03/13/2022    2:11 PM 03/13/2022    1:27 PM 03/13/2022    1:25 PM 01/27/2022   11:20 AM 01/27/2022   10:51 AM 01/27/2022   10:47 AM 07/22/2021   11:10 AM  BP/Weight  Systolic BP 144 136 149 144 142 146 130  Diastolic BP 86 86 77 88 86 96 86  Wt. (Lbs)   171   170.04   BMI   23.19 kg/m2   23.06 kg/m2

## 2022-03-13 NOTE — Patient Instructions (Addendum)
F/U first week in November, re evaluate blood pressure, call if you need me sooner  New is olmesartan 20 mg daily  for blood pressure which is still too high  Please continue amlodipine 10 mg daily for blood pressure  PLEASE reduce salt intake  Please start regular exercise  Take 2 TUMS every day for bone health   Nurse please arrange for flu vaccine next week  Thanks for choosing [facility], we consider it a privelige to serve you.

## 2022-03-16 ENCOUNTER — Encounter: Payer: Self-pay | Admitting: Family Medicine

## 2022-03-16 NOTE — Assessment & Plan Note (Signed)
Uncontrolled , add benicar 20 mg daily DASH diet and commitment to daily physical activity for a minimum of 30 minutes discussed and encouraged, as a part of hypertension management. The importance of attaining a healthy weight is also discussed.     03/13/2022    2:11 PM 03/13/2022    1:27 PM 03/13/2022    1:25 PM 01/27/2022   11:20 AM 01/27/2022   10:51 AM 01/27/2022   10:47 AM 07/22/2021   11:10 AM  BP/Weight  Systolic BP 818 403 754 [phone]  Diastolic BP 86 86 77 88 86 96 86  Wt. (Lbs)   171   170.04   BMI   23.19 kg/m2   23.06 kg/m2

## 2022-03-19 ENCOUNTER — Other Ambulatory Visit: Payer: Self-pay | Admitting: Family Medicine

## 2022-03-25 DIAGNOSIS — H31091 Other chorioretinal scars, right eye: Secondary | ICD-10-CM | POA: Diagnosis not present

## 2022-03-25 DIAGNOSIS — H40033 Anatomical narrow angle, bilateral: Secondary | ICD-10-CM | POA: Diagnosis not present

## 2022-03-25 DIAGNOSIS — H5203 Hypermetropia, bilateral: Secondary | ICD-10-CM | POA: Diagnosis not present

## 2022-03-25 DIAGNOSIS — H2513 Age-related nuclear cataract, bilateral: Secondary | ICD-10-CM | POA: Diagnosis not present

## 2022-03-25 DIAGNOSIS — H33321 Round hole, right eye: Secondary | ICD-10-CM | POA: Diagnosis not present

## 2022-05-13 ENCOUNTER — Ambulatory Visit (INDEPENDENT_AMBULATORY_CARE_PROVIDER_SITE_OTHER): Payer: Medicare Other

## 2022-05-13 DIAGNOSIS — Z23 Encounter for immunization: Secondary | ICD-10-CM

## 2022-05-22 ENCOUNTER — Encounter: Payer: Self-pay | Admitting: Family Medicine

## 2022-05-22 ENCOUNTER — Ambulatory Visit (INDEPENDENT_AMBULATORY_CARE_PROVIDER_SITE_OTHER): Payer: Medicare Other | Admitting: Family Medicine

## 2022-05-22 VITALS — BP 138/84 | HR 74 | Ht 72.0 in | Wt 174.0 lb

## 2022-05-22 DIAGNOSIS — Z13220 Encounter for screening for lipoid disorders: Secondary | ICD-10-CM | POA: Diagnosis not present

## 2022-05-22 DIAGNOSIS — I10 Essential (primary) hypertension: Secondary | ICD-10-CM

## 2022-05-22 DIAGNOSIS — J449 Chronic obstructive pulmonary disease, unspecified: Secondary | ICD-10-CM | POA: Diagnosis not present

## 2022-05-22 MED ORDER — AMLODIPINE-OLMESARTAN 10-20 MG PO TABS
1.0000 | ORAL_TABLET | Freq: Every day | ORAL | 3 refills | Status: DC
Start: 2022-05-22 — End: 2023-05-21

## 2022-05-22 NOTE — Progress Notes (Signed)
   Nolton Denis     MRN: [id number]      DOB: 04/22/1951   HPI Mr. Bedrosian is here for follow up and re-evaluation of chronic medical conditions, medication management and review of any available recent lab and radiology data.  Preventive health is updated, specifically  Cancer screening and Immunization.   Questions or concerns regarding consultations or procedures which the PT has had in the interim are  addressed. The PT denies any adverse reactions to current medications since the last visit.  There are no new concerns.  There are no specific complaints   ROS Denies recent fever or chills. Denies sinus pressure, nasal congestion, ear pain or sore throat. Denies chest congestion, productive cough or wheezing. Denies chest pains, palpitations and leg swelling Denies abdominal pain, nausea, vomiting,diarrhea or constipation.   Denies dysuria, frequency, hesitancy or incontinence. Denies joint pain, swelling and limitation in mobility. Denies headaches, seizures, numbness, or tingling. Denies depression, anxiety or insomnia. Denies skin break down or rash.   PE  BP 138/84   Pulse 74   Ht 6' (1.829 m)   Wt 174 lb (78.9 kg)   SpO2 98%   BMI 23.60 kg/m   Patient alert and oriented and in no cardiopulmonary distress.  HEENT: No facial asymmetry, EOMI,     Neck supple .  Chest: Clear to auscultation bilaterally.  CVS: S1, S2 no murmurs, no S3.Regular rate.  ABD: Soft non tender.   Ext: No edema  MS: Adequate ROM spine, shoulders, hips and knees.  Skin: Intact, no ulcerations or rash noted.  Psych: Good eye contact, normal affect. Memory intact not anxious or depressed appearing.  CNS: CN 2-12 intact, power,  normal throughout.no focal deficits noted.   Assessment & Plan  Essential hypertension Controlled, changed to combination med at same dose azor 10/20 one daily DASH diet and commitment to daily physical activity for a minimum of 30 minutes discussed and  encouraged, as a part of hypertension management. The importance of attaining a healthy weight is also discussed.     05/22/2022    1:28 PM 03/13/2022    2:11 PM 03/13/2022    1:27 PM 03/13/2022    1:25 PM 01/27/2022   11:20 AM 01/27/2022   10:51 AM 01/27/2022   10:47 AM  BP/Weight  Systolic BP 637 858 850 277 412 878 676  Diastolic BP 84 86 86 77 88 86 96  Wt. (Lbs) 174   171   170.04  BMI 23.6 kg/m2   23.19 kg/m2   23.06 kg/m2       COPD mixed type (HCC) Controlled, no change in medication

## 2022-05-22 NOTE — Patient Instructions (Addendum)
AWV past due please schedule  Annual exam in office with mD in Feb 2024, call if you need me sooner  Fasting lipid, cmp and eGFr, and cBC 1 week before Feb visit  Please get covid vaccine at your pharmacy  [facility] and 2024!  It is important that you exercise regularly at least 30 minutes 5 times a week. If you develop chest pain, have severe difficulty breathing, or feel very tired, stop exercising immediately and seek medical attention   DASH Eating Plan DASH stands for Dietary Approaches to Stop Hypertension. The DASH eating plan is a healthy eating plan that has been shown to: Reduce high blood pressure (hypertension). Reduce your risk for type 2 diabetes, heart disease, and stroke. Help with weight loss. What are tips for following this plan? Reading food labels Check food labels for the amount of salt (sodium) per serving. Choose foods with less than 5 percent of the Daily Value of sodium. Generally, foods with less than 300 milligrams (mg) of sodium per serving fit into this eating plan. To find whole grains, look for the word "whole" as the first word in the ingredient list. Shopping Buy products labeled as "low-sodium" or "no salt added." Buy fresh foods. Avoid canned foods and pre-made or frozen meals. Cooking Avoid adding salt when cooking. Use salt-free seasonings or herbs instead of table salt or sea salt. Check with your health care provider or pharmacist before using salt substitutes. Do not fry foods. Cook foods using healthy methods such as baking, boiling, grilling, roasting, and broiling instead. Cook with heart-healthy oils, such as olive, canola, avocado, soybean, or sunflower oil. Meal planning  Eat a balanced diet that includes: 4 or more servings of fruits and 4 or more servings of vegetables each day. Try to fill one-half of your plate with fruits and vegetables. 6-8 servings of whole grains each day. Less than 6 oz (170 g) of lean meat, poultry, or  fish each day. A 3-oz (85-g) serving of meat is about the same size as a deck of cards. One egg equals 1 oz (28 g). 2-3 servings of low-fat dairy each day. One serving is 1 cup (237 mL). 1 serving of nuts, seeds, or beans 5 times each week. 2-3 servings of heart-healthy fats. Healthy fats called omega-3 fatty acids are found in foods such as walnuts, flaxseeds, fortified milks, and eggs. These fats are also found in cold-water fish, such as sardines, salmon, and mackerel. Limit how much you eat of: Canned or prepackaged foods. Food that is high in trans fat, such as some fried foods. Food that is high in saturated fat, such as fatty meat. Desserts and other sweets, sugary drinks, and other foods with added sugar. Full-fat dairy products. Do not salt foods before eating. Do not eat more than 4 egg yolks a week. Try to eat at least 2 vegetarian meals a week. Eat more home-cooked food and less restaurant, buffet, and fast food. Lifestyle When eating at a restaurant, ask that your food be prepared with less salt or no salt, if possible. If you drink alcohol: Limit how much you use to: 0-1 drink a day for women who are not pregnant. 0-2 drinks a day for men. Be aware of how much alcohol is in your drink. In the [place] one drink equals one 12 oz bottle of beer (355 mL), one 5 oz glass of wine (148 mL), or one 1 oz glass of hard liquor (44 mL). General information Avoid eating  more than 2,300 mg of salt a day. If you have hypertension, you may need to reduce your sodium intake to 1,500 mg a day. Work with your health care provider to maintain a healthy body weight or to lose weight. Ask what an ideal weight is for you. Get at least 30 minutes of exercise that causes your heart to beat faster (aerobic exercise) most days of the week. Activities may include walking, swimming, or biking. Work with your health care provider or dietitian to adjust your eating plan to your individual calorie needs. What  foods should I eat? Fruits All fresh, dried, or frozen fruit. Canned fruit in natural juice (without added sugar). Vegetables Fresh or frozen vegetables (raw, steamed, roasted, or grilled). Low-sodium or reduced-sodium tomato and vegetable juice. Low-sodium or reduced-sodium tomato sauce and tomato paste. Low-sodium or reduced-sodium canned vegetables. Grains Whole-grain or whole-wheat bread. Whole-grain or whole-wheat pasta. Brown rice. [place]. Bulgur. Whole-grain and low-sodium cereals. Pita bread. Low-fat, low-sodium crackers. Whole-wheat flour tortillas. Meats and other proteins Skinless chicken or [place]. Ground chicken or [place]. Pork with fat trimmed off. Fish and seafood. Egg whites. Dried beans, peas, or lentils. Unsalted nuts, nut butters, and seeds. Unsalted canned beans. Lean cuts of beef with fat trimmed off. Low-sodium, lean precooked or cured meat, such as sausages or meat loaves. Dairy Low-fat (1%) or fat-free (skim) milk. Reduced-fat, low-fat, or fat-free cheeses. Nonfat, low-sodium ricotta or cottage cheese. Low-fat or nonfat yogurt. Low-fat, low-sodium cheese. Fats and oils Soft margarine without trans fats. Vegetable oil. Reduced-fat, low-fat, or light mayonnaise and salad dressings (reduced-sodium). Canola, safflower, olive, avocado, soybean, and sunflower oils. Avocado. Seasonings and condiments Herbs. Spices. Seasoning mixes without salt. Other foods Unsalted popcorn and pretzels. Fat-free sweets. The items listed above may not be a complete list of foods and beverages you can eat. Contact a dietitian for more information. What foods should I avoid? Fruits Canned fruit in a light or heavy syrup. Fried fruit. Fruit in cream or butter sauce. Vegetables Creamed or fried vegetables. Vegetables in a cheese sauce. Regular canned vegetables (not low-sodium or reduced-sodium). Regular canned tomato sauce and paste (not low-sodium or reduced-sodium). Regular tomato and  vegetable juice (not low-sodium or reduced-sodium). Angie Fava. Olives. Grains Baked goods made with fat, such as croissants, muffins, or some breads. Dry pasta or rice meal packs. Meats and other proteins Fatty cuts of meat. Ribs. Fried meat. Berniece Salines. Bologna, salami, and other precooked or cured meats, such as sausages or meat loaves. Fat from the back of a pig (fatback). Bratwurst. Salted nuts and seeds. Canned beans with added salt. Canned or smoked fish. Whole eggs or egg yolks. Chicken or [place] with skin. Dairy Whole or 2% milk, cream, and half-and-half. Whole or full-fat cream cheese. Whole-fat or sweetened yogurt. Full-fat cheese. Nondairy creamers. Whipped toppings. Processed cheese and cheese spreads. Fats and oils Butter. Stick margarine. Lard. Shortening. Ghee. Bacon fat. Tropical oils, such as coconut, palm kernel, or palm oil. Seasonings and condiments Onion salt, garlic salt, seasoned salt, table salt, and sea salt. Worcestershire sauce. Tartar sauce. Barbecue sauce. Teriyaki sauce. Soy sauce, including reduced-sodium. Steak sauce. Canned and packaged gravies. Fish sauce. Oyster sauce. Cocktail sauce. Store-bought horseradish. Ketchup. Mustard. Meat flavorings and tenderizers. Bouillon cubes. Hot sauces. Pre-made or packaged marinades. Pre-made or packaged taco seasonings. Relishes. Regular salad dressings. Other foods Salted popcorn and pretzels. The items listed above may not be a complete list of foods and beverages you should avoid. Contact a dietitian for more information.  Where to find more information National Heart, Lung, and Blood Institute: https://wilson-eaton.com/ American Heart Association: www.heart.org Academy of Nutrition and Dietetics: www.eatright.[place]: www.kidney.org Summary The DASH eating plan is a healthy eating plan that has been shown to reduce high blood pressure (hypertension). It may also reduce your risk for type 2 diabetes, heart disease,  and stroke. When on the DASH eating plan, aim to eat more fresh fruits and vegetables, whole grains, lean proteins, low-fat dairy, and heart-healthy fats. With the DASH eating plan, you should limit salt (sodium) intake to 2,300 mg a day. If you have hypertension, you may need to reduce your sodium intake to 1,500 mg a day. Work with your health care provider or dietitian to adjust your eating plan to your individual calorie needs. This information is not intended to replace advice given to you by your health care provider. Make sure you discuss any questions you have with your health care provider. Document Revised: 06/09/2019 Document Reviewed: 06/09/2019 Elsevier Patient Education  [place].

## 2022-05-23 NOTE — Assessment & Plan Note (Signed)
Controlled, no change in medication  

## 2022-05-23 NOTE — Assessment & Plan Note (Signed)
Controlled, changed to combination med at same dose azor 10/20 one daily DASH diet and commitment to daily physical activity for a minimum of 30 minutes discussed and encouraged, as a part of hypertension management. The importance of attaining a healthy weight is also discussed.     05/22/2022    1:28 PM 03/13/2022    2:11 PM 03/13/2022    1:27 PM 03/13/2022    1:25 PM 01/27/2022   11:20 AM 01/27/2022   10:51 AM 01/27/2022   10:47 AM  BP/Weight  Systolic BP 579 038 333 832 919 166 060  Diastolic BP 84 86 86 77 88 86 96  Wt. (Lbs) 174   171   170.04  BMI 23.6 kg/m2   23.19 kg/m2   23.06 kg/m2

## 2022-05-25 ENCOUNTER — Encounter: Payer: Medicare Other | Admitting: Internal Medicine

## 2022-06-09 ENCOUNTER — Other Ambulatory Visit: Payer: Self-pay | Admitting: Family Medicine

## 2022-07-22 ENCOUNTER — Other Ambulatory Visit: Payer: Self-pay | Admitting: Family Medicine

## 2022-07-24 ENCOUNTER — Encounter: Payer: Medicare Other | Admitting: Family Medicine

## 2022-08-24 DIAGNOSIS — Z13220 Encounter for screening for lipoid disorders: Secondary | ICD-10-CM | POA: Diagnosis not present

## 2022-08-24 DIAGNOSIS — I10 Essential (primary) hypertension: Secondary | ICD-10-CM | POA: Diagnosis not present

## 2022-08-25 LAB — CMP14+EGFR
ALT: 11 IU/L (ref 0–44)
AST: 14 IU/L (ref 0–40)
Albumin/Globulin Ratio: 1.8 (ref 1.2–2.2)
Albumin: 4.1 g/dL (ref 3.8–4.8)
Alkaline Phosphatase: 54 IU/L (ref 44–121)
BUN/Creatinine Ratio: 13 (ref 10–24)
BUN: 15 mg/dL (ref 8–27)
Bilirubin Total: 0.7 mg/dL (ref 0.0–1.2)
CO2: 23 mmol/L (ref 20–29)
Calcium: 8.8 mg/dL (ref 8.6–10.2)
Chloride: 109 mmol/L — ABNORMAL HIGH (ref 96–106)
Creatinine, Ser: 1.16 mg/dL (ref 0.76–1.27)
Globulin, Total: 2.3 g/dL (ref 1.5–4.5)
Glucose: 102 mg/dL — ABNORMAL HIGH (ref 70–99)
Potassium: 3.8 mmol/L (ref 3.5–5.2)
Sodium: 147 mmol/L — ABNORMAL HIGH (ref 134–144)
Total Protein: 6.4 g/dL (ref 6.0–8.5)
eGFR: 67 mL/min/{1.73_m2} (ref >59)

## 2022-08-25 LAB — CBC WITH DIFFERENTIAL/PLATELET
Basophils Absolute: 0 10*3/uL (ref 0.0–0.2)
Basos: 1 %
EOS (ABSOLUTE): 0.2 10*3/uL (ref 0.0–0.4)
Eos: 4 %
Hematocrit: 35.7 % — ABNORMAL LOW (ref 37.5–51.0)
Hemoglobin: 11.8 g/dL — ABNORMAL LOW (ref 13.0–17.7)
Immature Grans (Abs): 0 10*3/uL (ref 0.0–0.1)
Immature Granulocytes: 0 %
Lymphocytes Absolute: 1.8 10*3/uL (ref 0.7–3.1)
Lymphs: 32 %
MCH: 32.1 pg (ref 26.6–33.0)
MCHC: 33.1 g/dL (ref 31.5–35.7)
MCV: 97 fL (ref 79–97)
Monocytes Absolute: 0.4 10*3/uL (ref 0.1–0.9)
Monocytes: 8 %
Neutrophils Absolute: 3 10*3/uL (ref 1.4–7.0)
Neutrophils: 55 %
Platelets: 152 10*3/uL (ref 150–450)
RBC: 3.68 x10E6/uL — ABNORMAL LOW (ref 4.14–5.80)
RDW: 12.5 % (ref 11.6–15.4)
WBC: 5.5 10*3/uL (ref 3.4–10.8)

## 2022-08-25 LAB — LIPID PANEL
Chol/HDL Ratio: 2.7 ratio (ref 0.0–5.0)
Cholesterol, Total: 126 mg/dL (ref 100–199)
HDL: 46 mg/dL (ref >39)
LDL Chol Calc (NIH): 65 mg/dL (ref 0–99)
Triglycerides: 77 mg/dL (ref 0–149)
VLDL Cholesterol Cal: 15 mg/dL (ref 5–40)

## 2022-08-27 ENCOUNTER — Ambulatory Visit (INDEPENDENT_AMBULATORY_CARE_PROVIDER_SITE_OTHER): Payer: 59 | Admitting: Family Medicine

## 2022-08-27 ENCOUNTER — Encounter: Payer: Self-pay | Admitting: Family Medicine

## 2022-08-27 VITALS — BP 124/82 | HR 67 | Ht 72.0 in | Wt 171.1 lb

## 2022-08-27 DIAGNOSIS — Z0000 Encounter for general adult medical examination without abnormal findings: Secondary | ICD-10-CM

## 2022-08-27 NOTE — Progress Notes (Signed)
   Marc Ward     MRN: [id number]      DOB: 10-Jun-1951   HPI: Patient is in for annual physical exam. No other health concerns are expressed or addressed at the visit. Recent labs, if available are reviewed. Immunization is reviewed , and  updated if needed.    PE; BP 124/82   Pulse 67   Ht 6' (1.829 m)   Wt 171 lb 1.9 oz (77.6 kg)   SpO2 96%   BMI 23.21 kg/m   Pleasant male, alert and oriented x 3, in no cardio-pulmonary distress. Afebrile. HEENT No facial trauma or asymetry. Sinuses non tender. EOMI External ears normal,  Neck: supple, no adenopathy,JVD or thyromegaly.No bruits.  Chest: Clear to ascultation bilaterally.No crackles or wheezes. Non tender to palpation  Cardiovascular system; Heart sounds normal,  S1 and  S2 ,no S3.  No murmur, or thrill. Apical beat not displaced Peripheral pulses normal.  Abdomen: Soft, non tender, no organomegaly or masses. No bruits. Bowel sounds normal. No guarding, tenderness or rebound.    Musculoskeletal exam: Full ROM of spine, hips , shoulders and knees. No deformity ,swelling or crepitus noted. No muscle wasting or atrophy.   Neurologic: Cranial nerves 2 to 12 intact. Power, tone ,sensation  normal throughout. No disturbance in gait. No tremor.  Skin: Intact, no ulceration, erythema , scaling or rash noted. Pigmentation normal throughout  Psych; Normal mood and affect. Judgement and concentration normal   Assessment & Plan:  Encounter for annual physical exam Annual exam as documented. Counseling done  re healthy lifestyle involving commitment to 150 minutes exercise per week, heart healthy diet, and attaining healthy weight.The importance of adequate sleep also discussed. Regular seat belt use and home safety, is also discussed. Changes in health habits are decided on by the patient with goals and time frames  set for achieving them. Immunization and cancer screening needs are specifically addressed at  this visit.   Cocaine addiction ([place]) Reports unchanged, encouraged and counseled to quit

## 2022-08-27 NOTE — Assessment & Plan Note (Signed)

## 2022-08-27 NOTE — Assessment & Plan Note (Signed)
Reports unchanged, encouraged and counseled to quit

## 2022-08-27 NOTE — Patient Instructions (Signed)
F/u early September, call if you need me sooner  Please call in August for lab order for PSA, TSH and vit D  and chem 7 and eGFR , get this fasting 5 days before September appointment please  It is important that you exercise regularly at least 30 minutes 5 times a week. If you develop chest pain, have severe difficulty breathing, or feel very tired, stop exercising immediately and seek medical attention   No medication changes  Thanks for choosing [facility], we consider it a privelige to serve you.

## 2022-12-16 ENCOUNTER — Other Ambulatory Visit: Payer: Self-pay | Admitting: Family Medicine

## 2023-01-20 ENCOUNTER — Other Ambulatory Visit: Payer: Self-pay | Admitting: Family Medicine

## 2023-03-02 ENCOUNTER — Encounter: Payer: Self-pay | Admitting: *Deleted

## 2023-03-30 DIAGNOSIS — H40033 Anatomical narrow angle, bilateral: Secondary | ICD-10-CM | POA: Diagnosis not present

## 2023-03-30 DIAGNOSIS — H33321 Round hole, right eye: Secondary | ICD-10-CM | POA: Diagnosis not present

## 2023-03-30 DIAGNOSIS — H2513 Age-related nuclear cataract, bilateral: Secondary | ICD-10-CM | POA: Diagnosis not present

## 2023-04-01 ENCOUNTER — Ambulatory Visit: Payer: 59 | Admitting: Family Medicine

## 2023-04-23 ENCOUNTER — Ambulatory Visit: Payer: 59 | Admitting: Family Medicine

## 2023-05-21 ENCOUNTER — Encounter: Payer: Self-pay | Admitting: Family Medicine

## 2023-05-21 ENCOUNTER — Ambulatory Visit (INDEPENDENT_AMBULATORY_CARE_PROVIDER_SITE_OTHER): Payer: 59 | Admitting: Family Medicine

## 2023-05-21 VITALS — BP 150/90 | HR 68 | Ht 72.0 in | Wt 172.0 lb

## 2023-05-21 DIAGNOSIS — I10 Essential (primary) hypertension: Secondary | ICD-10-CM

## 2023-05-21 DIAGNOSIS — Z13220 Encounter for screening for lipoid disorders: Secondary | ICD-10-CM | POA: Diagnosis not present

## 2023-05-21 DIAGNOSIS — Z23 Encounter for immunization: Secondary | ICD-10-CM | POA: Diagnosis not present

## 2023-05-21 DIAGNOSIS — E559 Vitamin D deficiency, unspecified: Secondary | ICD-10-CM

## 2023-05-21 DIAGNOSIS — F14288 Cocaine dependence with other cocaine-induced disorder: Secondary | ICD-10-CM

## 2023-05-21 DIAGNOSIS — Z125 Encounter for screening for malignant neoplasm of prostate: Secondary | ICD-10-CM | POA: Diagnosis not present

## 2023-05-21 DIAGNOSIS — D126 Benign neoplasm of colon, unspecified: Secondary | ICD-10-CM | POA: Diagnosis not present

## 2023-05-21 MED ORDER — AMLODIPINE-OLMESARTAN 10-40 MG PO TABS: 1.0000 | ORAL_TABLET | Freq: Every day | ORAL | 3 refills | Status: DC

## 2023-05-21 NOTE — Patient Instructions (Addendum)
F/u re check BP in 6 to 7 weeks, call if you ned me sooner  Annual wellness past due pls schedule at [facility]ut  Flu vaccine in office today  New higher dose of medication azor 10/40 one daily for BP  Labs today, chem 7 , PSA, tSH, vit D , CBC  Thanks for choosing [facility], we consider it a privelige to serve you.

## 2023-05-23 DIAGNOSIS — Z13220 Encounter for screening for lipoid disorders: Secondary | ICD-10-CM | POA: Insufficient documentation

## 2023-05-23 DIAGNOSIS — E559 Vitamin D deficiency, unspecified: Secondary | ICD-10-CM | POA: Insufficient documentation

## 2023-05-23 DIAGNOSIS — Z125 Encounter for screening for malignant neoplasm of prostate: Secondary | ICD-10-CM | POA: Insufficient documentation

## 2023-05-23 DIAGNOSIS — Z23 Encounter for immunization: Secondary | ICD-10-CM | POA: Insufficient documentation

## 2023-05-23 LAB — CBC
Hematocrit: 35.9 % — ABNORMAL LOW (ref 37.5–51.0)
Hemoglobin: 11.9 g/dL — ABNORMAL LOW (ref 13.0–17.7)
MCH: 32.8 pg (ref 26.6–33.0)
MCHC: 33.1 g/dL (ref 31.5–35.7)
MCV: 99 fL — ABNORMAL HIGH (ref 79–97)
Platelets: 159 10*3/uL (ref 150–450)
RBC: 3.63 x10E6/uL — ABNORMAL LOW (ref 4.14–5.80)
RDW: 12.3 % (ref 11.6–15.4)
WBC: 6.3 10*3/uL (ref 3.4–10.8)

## 2023-05-23 LAB — BMP8+EGFR
BUN/Creatinine Ratio: 13 (ref 10–24)
BUN: 17 mg/dL (ref 8–27)
CO2: 24 mmol/L (ref 20–29)
Calcium: 8.8 mg/dL (ref 8.6–10.2)
Chloride: 109 mmol/L — ABNORMAL HIGH (ref 96–106)
Creatinine, Ser: 1.29 mg/dL — ABNORMAL HIGH (ref 0.76–1.27)
Glucose: 101 mg/dL — ABNORMAL HIGH (ref 70–99)
Potassium: 3.9 mmol/L (ref 3.5–5.2)
Sodium: 146 mmol/L — ABNORMAL HIGH (ref 134–144)
eGFR: 58 mL/min/{1.73_m2} — ABNORMAL LOW (ref >59)

## 2023-05-23 LAB — VITAMIN D 25 HYDROXY (VIT D DEFICIENCY, FRACTURES): Vit D, 25-Hydroxy: 34.8 ng/mL (ref 30.0–100.0)

## 2023-05-23 LAB — TSH: TSH: 4.4 u[IU]/mL (ref 0.450–4.500)

## 2023-05-23 LAB — PSA: Prostate Specific Ag, Serum: 1 ng/mL (ref 0.0–4.0)

## 2023-05-23 NOTE — Assessment & Plan Note (Signed)
Hyperlipidemia:Low fat diet discussed and encouraged.   Lipid Panel  Lab Results  Component Value Date   CHOL 126 08/24/2022   HDL 46 08/24/2022   LDLCALC 65 08/24/2022   TRIG 77 08/24/2022   CHOLHDL 2.7 08/24/2022     Updated lab needed at/ before next visit.

## 2023-05-23 NOTE — Assessment & Plan Note (Signed)
Recommend discontinuing use

## 2023-05-23 NOTE — Assessment & Plan Note (Signed)
Rept colonoscopy due

## 2023-05-23 NOTE — Assessment & Plan Note (Signed)
Uncontrolled , inc azor dose and re assess in 7 weeks DASH diet and commitment to daily physical activity for a minimum of 30 minutes discussed and encouraged, as a part of hypertension management. The importance of attaining a healthy weight is also discussed.     05/21/2023    4:12 PM 05/21/2023    3:53 PM 05/21/2023    3:52 PM 08/27/2022    2:04 PM 08/27/2022    1:29 PM 08/27/2022    1:28 PM 05/22/2022    1:28 PM  BP/Weight  Systolic BP 150 144 147 124 138 148 138  Diastolic BP 90 91 82 82 88 93 84  Wt. (Lbs)   172.04   171.12 174  BMI   23.33 kg/m2   23.21 kg/m2 23.6 kg/m2

## 2023-05-23 NOTE — Assessment & Plan Note (Signed)
Updated lab needed at/ before next visit.   

## 2023-05-23 NOTE — Progress Notes (Signed)
   Marc Ward     MRN: [id number]      DOB: [birth year]  Chief Complaint  Patient presents with   Follow-up    Follow up    HPI [name] is here for follow up and re-evaluation of chronic medical conditions, medication management and review of any available recent lab and radiology data.  Preventive health is updated, specifically  Cancer screening and Immunization.   Needs colonoscopy The PT denies any adverse reactions to current medications since the last visit.  Stressed states he has no access to his cash ROS Denies recent fever or chills. Denies sinus pressure, nasal congestion, ear pain or sore throat. Denies chest congestion, productive cough or wheezing. Denies chest pains, palpitations and leg swelling Denies abdominal pain, nausea, vomiting,diarrhea or constipation.   Denies dysuria, frequency, hesitancy or incontinence. Denies joint pain, swelling and limitation in mobility. Denies headaches, seizures, numbness, or tingling.  Denies skin break down or rash.   PE  BP (!) 150/90   Pulse 68   Ht 6' (1.829 m)   Wt 172 lb 0.6 oz (78 kg)   SpO2 96%   BMI 23.33 kg/m   Patient alert and oriented and in no cardiopulmonary distress.  HEENT: No facial asymmetry, EOMI,     Neck supple .  Chest: Clear to auscultation bilaterally.  CVS: S1, S2 no murmurs, no S3.Regular rate.  ABD: Soft non tender.   Ext: No edema  MS: Adequate ROM spine, shoulders, hips and knees.  Skin: Intact, no ulcerations or rash noted.  Psych: Good eye contact, normal affect. Memory intact not anxious or depressed appearing.  CNS: CN 2-12 intact, power,  normal throughout.no focal deficits noted.   Assessment & Plan  Essential hypertension Uncontrolled , inc azor dose and re assess in 7 weeks DASH diet and commitment to daily physical activity for a minimum of 30 minutes discussed and encouraged, as a part of hypertension management. The importance of attaining a healthy weight is  also discussed.     05/21/2023    4:12 PM 05/21/2023    3:53 PM 05/21/2023    3:52 PM 08/27/2022    2:04 PM 08/27/2022    1:29 PM 08/27/2022    1:28 PM 05/22/2022    1:28 PM  BP/Weight  Systolic BP 150 144 147 124 138 148 138  Diastolic BP 90 91 82 82 88 93 84  Wt. (Lbs)   172.04   171.12 174  BMI   23.33 kg/m2   23.21 kg/m2 23.6 kg/m2       Cocaine addiction (HCC) Recommend discontinuing use  TUBULOVILLOUS ADENOMA, COLON Rept colonoscopy due  Lipid screening Hyperlipidemia:Low fat diet discussed and encouraged.   Lipid Panel  Lab Results  Component Value Date   CHOL 126 08/24/2022   HDL 46 08/24/2022   LDLCALC 65 08/24/2022   TRIG 77 08/24/2022   CHOLHDL 2.7 08/24/2022     Updated lab needed at/ before next visit.   Vitamin D deficiency Updated lab needed at/ before next visit.

## 2023-06-06 ENCOUNTER — Other Ambulatory Visit: Payer: Self-pay | Admitting: Family Medicine

## 2023-07-06 ENCOUNTER — Encounter: Payer: Self-pay | Admitting: Family Medicine

## 2023-07-06 ENCOUNTER — Ambulatory Visit (INDEPENDENT_AMBULATORY_CARE_PROVIDER_SITE_OTHER): Payer: 59 | Admitting: Family Medicine

## 2023-07-06 VITALS — BP 140/80 | HR 69 | Ht 72.0 in | Wt 175.1 lb

## 2023-07-06 DIAGNOSIS — I10 Essential (primary) hypertension: Secondary | ICD-10-CM | POA: Diagnosis not present

## 2023-07-06 DIAGNOSIS — F14288 Cocaine dependence with other cocaine-induced disorder: Secondary | ICD-10-CM | POA: Diagnosis not present

## 2023-07-06 DIAGNOSIS — Z860101 Personal history of adenomatous and serrated colon polyps: Secondary | ICD-10-CM | POA: Diagnosis not present

## 2023-07-06 MED ORDER — CLONIDINE HCL 0.1 MG PO TABS: 0.1000 mg | ORAL_TABLET | Freq: Every day | ORAL | 3 refills | Status: DC

## 2023-07-06 NOTE — Patient Instructions (Addendum)
Diabetes Mellitus Basics  Diabetes mellitus, or diabetes, is a long-term (chronic) disease. It occurs when the body does not properly use sugar (glucose) that is released from food after you eat. Diabetes mellitus may be caused by one or both of these problems: Your pancreas does not make enough of a hormone called insulin. Your body does not react in a normal way to the insulin that it makes. Insulin lets glucose enter cells in your body. This gives you energy. If you have diabetes, glucose cannot get into cells. This causes high blood glucose (hyperglycemia). How to treat and manage diabetes You may need to take insulin or other diabetes medicines daily to keep your glucose in balance. If you are prescribed insulin, you will learn how to give yourself insulin by injection. You may need to adjust the amount of insulin you take based on the foods that you eat. You will need to check your blood glucose levels using a glucose monitor as told by your health care provider. The readings can help determine if you have low or high blood glucose. Generally, you should have these blood glucose levels: Before meals (preprandial): 80-130 mg/dL (0.8-6.5 mmol/L). After meals (postprandial): below 180 mg/dL (10 mmol/L). Hemoglobin A1c (HbA1c) level: less than 7%. Your health care provider will set treatment goals for you. Keep all follow-up visits. This is important. Follow these instructions at home: Diabetes medicines Take your diabetes medicines every day as told by your health care provider. List your diabetes medicines here: Name of medicine: ______________________________ Amount (dose): _______________ Time (a.m./p.m.): _______________ Notes: ___________________________________ Name of medicine: ______________________________ Amount (dose): _______________ Time (a.m./p.m.): _______________ Notes: ___________________________________ Name of medicine: ______________________________ Amount (dose):  _______________ Time (a.m./p.m.): _______________ Notes: ___________________________________ Insulin If you use insulin, list the types of insulin you use here: Insulin type: ______________________________ Amount (dose): _______________ Time (a.m./p.m.): _______________Notes: ___________________________________ Insulin type: ______________________________ Amount (dose): _______________ Time (a.m./p.m.): _______________ Notes: ___________________________________ Insulin type: ______________________________ Amount (dose): _______________ Time (a.m./p.m.): _______________ Notes: ___________________________________ Insulin type: ______________________________ Amount (dose): _______________ Time (a.m./p.m.): _______________ Notes: ___________________________________ Insulin type: ______________________________ Amount (dose): _______________ Time (a.m./p.m.): _______________ Notes: ___________________________________ Managing blood glucose  Check your blood glucose levels using a glucose monitor as told by your health care provider. Write down the times that you check your glucose levels here: Time: _______________ Notes: ___________________________________ Time: _______________ Notes: ___________________________________ Time: _______________ Notes: ___________________________________ Time: _______________ Notes: ___________________________________ Time: _______________ Notes: ___________________________________ Time: _______________ Notes: ___________________________________  Low blood glucose Low blood glucose (hypoglycemia) is when glucose is at or below 70 mg/dL (3.9 mmol/L). Symptoms may include: Feeling: Hungry. Sweaty and clammy. Irritable or easily upset. Dizzy. Sleepy. Having: A fast heartbeat. A headache. A change in your vision. Numbness around the mouth, lips, or tongue. Having trouble with: Moving (coordination). Sleeping. Treating low blood glucose To treat low blood  glucose, eat or drink something containing sugar right away. If you can think clearly and swallow safely, follow the 15:15 rule: Take 15 grams of a fast-acting carb (carbohydrate), as told by your health care provider. Some fast-acting carbs are: Glucose tablets: take 3-4 tablets. Hard candy: eat 3-5 pieces. Fruit juice: drink 4 oz (120 mL). Regular (not diet) soda: drink 4-6 oz (120-180 mL). Honey or sugar: eat 1 Tbsp (15 mL). Check your blood glucose levels 15 minutes after you take the carb. If your glucose is still at or below 70 mg/dL (3.9 mmol/L), take 15 grams of a carb again. If your glucose does not go above 70 mg/dL (3.9 mmol/L) after  3 tries, get help right away. After your glucose goes back to normal, eat a meal or a snack within 1 hour. Treating very low blood glucose If your glucose is at or below 54 mg/dL (3 mmol/L), you have very low blood glucose (severe hypoglycemia). This is an emergency. Do not wait to see if the symptoms will go away. Get medical help right away. Call your local emergency services (911 in the [place] Do not drive yourself to the hospital. Questions to ask your health care provider Should I talk with a diabetes educator? What equipment will I need to care for myself at home? What diabetes medicines do I need? When should I take them? How often do I need to check my blood glucose levels? What number can I call if I have questions? When is my follow-up visit? Where can I find a support group for people with diabetes? Where to find more information American Diabetes Association: www.diabetes.org Association of Diabetes Care and Education Specialists: www.diabeteseducator.org Contact a health care provider if: Your blood glucose is at or above 240 mg/dL (16.6 mmol/L) for 2 days in a row. You have been sick or have had a fever for 2 days or more, and you are not getting better. You have any of these problems for more than 6 hours: You cannot eat or  drink. You feel nauseous. You vomit. You have diarrhea. Get help right away if: Your blood glucose is lower than 54 mg/dL (3 mmol/L). You get confused. You have trouble thinking clearly. You have trouble breathing. These symptoms may represent a serious problem that is an emergency. Do not wait to see if the symptoms will go away. Get medical help right away. Call your local emergency services (911 in the [place] Do not drive yourself to the hospital. Summary Diabetes mellitus is a chronic disease that occurs when the body does not properly use sugar (glucose) that is released from food after you eat. Take insulin and diabetes medicines as told. Check your blood glucose every day, as often as told. Keep all follow-up visits. This is important. This information is not intended to replace advice given to you by your health care provider. Make sure you discuss any questions you have with your health care provider. Document Revised: 11/07/2019 Document Reviewed: 11/07/2019 Elsevier Patient Education  2024 [employer]. Annual exam end Feb or early March, re evaluate  blood pressure  New additional medication is clonidine 0.1 mg one at bedtime, continue azor as before  Please schedule wellness at checkout  Please schedule appt at [facility] during checkout , pt has letter to call and schedule as a f/u on his referral for colonoscopy ( nurse pkls het appt info if able)  Thanks for choosing [facility], we consider it a privelige to serve you.

## 2023-07-06 NOTE — Progress Notes (Signed)
   Marc Ward     MRN: [id number]      DOB: 11-Nov-1950  Chief Complaint  Patient presents with   Follow-up    Follow up   Hypertension    Follow up after drug holiday    HPI [name] is here for follow up and re-evaluation of chronic medical conditions, in particular uncontrolled HTN Reports med compliance , c/o chronic fatiguestill using cocaine regulalrly no intent of joining [facility]   ROS Denies recent fever or chills. Denies sinus pressure, nasal congestion, ear pain or sore throat. Denies chest congestion, productive cough or wheezing. Denies chest pains, palpitations and leg swelling Denies abdominal pain, nausea, vomiting,diarrhea or constipation.   Denies dysuria, frequency, hesitancy or incontinence. Denies joint pain, swelling and limitation in mobility. Denies headaches, seizures, numbness, or tingling. Denies skin break down or rash.   PE  BP (!) 140/80   Pulse 69   Ht 6' (1.829 m)   Wt 175 lb 1.9 oz (79.4 kg)   SpO2 90%   BMI 23.75 kg/m   Patient alert and oriented and in no cardiopulmonary distress.  HEENT: No facial asymmetry, EOMI,     Neck supple .  Chest: Clear to auscultation bilaterally.  CVS: S1, S2 no murmurs, no S3.Regular rate.  ABD: Soft non tender.   Ext: No edema  MS: Adequate ROM spine, shoulders, hips and knees.  Skin: Intact, no ulcerations or rash noted.  Psych: Good eye contact, normal affect. Memory intact not anxious or depressed appearing.  CNS: CN 2-12 intact, power,  normal throughout.no focal deficits noted.   Assessment & Plan  Essential hypertension DASH diet and commitment to daily physical activity for a minimum of 30 minutes discussed and encouraged, as a part of hypertension management. The importance of attaining a healthy weight is also discussed Improved but not at goal, need to start regular exercise program and focus on plant based diet     07/06/2023    4:19 PM 07/06/2023    3:26 PM 05/21/2023    4:12 PM  05/21/2023    3:53 PM 05/21/2023    3:52 PM 08/27/2022    2:04 PM 08/27/2022    1:29 PM  BP/Weight  Systolic BP 140 137 150 144 147 124 138  Diastolic BP 80 85 90 91 82 82 88  Wt. (Lbs)  175.12   172.04    BMI  23.75 kg/m2   23.33 kg/m2       Improved add clnidine at bedtime  Cocaine addiction (HCC) Ongoing challenge not actively seeking help, not motivated to quit. Encouraged to join [facility] group, not committing and not currently interested  H/O adenomatous polyp of colon Rept colonoscopy due pt needs to initiate process has had tubular adenoma in the past

## 2023-07-06 NOTE — Assessment & Plan Note (Signed)
Rept colonoscopy due pt needs to initiate process has had tubular adenoma in the past

## 2023-07-06 NOTE — Assessment & Plan Note (Addendum)
DASH diet and commitment to daily physical activity for a minimum of 30 minutes discussed and encouraged, as a part of hypertension management. The importance of attaining a healthy weight is also discussed Improved but not at goal, need to start regular exercise program and focus on plant based diet     07/06/2023    4:19 PM 07/06/2023    3:26 PM 05/21/2023    4:12 PM 05/21/2023    3:53 PM 05/21/2023    3:52 PM 08/27/2022    2:04 PM 08/27/2022    1:29 PM  BP/Weight  Systolic BP 140 137 150 144 147 124 138  Diastolic BP 80 85 90 91 82 82 88  Wt. (Lbs)  175.12   172.04    BMI  23.75 kg/m2   23.33 kg/m2       Improved add clnidine at bedtime

## 2023-07-06 NOTE — Assessment & Plan Note (Signed)
Ongoing challenge not actively seeking help, not motivated to quit. Encouraged to join [facility] group, not committing and not currently interested

## 2023-07-14 ENCOUNTER — Other Ambulatory Visit: Payer: Self-pay | Admitting: Family Medicine

## 2023-07-26 ENCOUNTER — Telehealth: Payer: Self-pay | Admitting: Family Medicine

## 2023-07-26 DIAGNOSIS — Z1211 Encounter for screening for malignant neoplasm of colon: Secondary | ICD-10-CM

## 2023-07-26 NOTE — Telephone Encounter (Signed)
 Copied from CRM [id number]. Topic: Appointments - Scheduling Inquiry for Clinic >> Jul 26, 2023 11:14 AM Nestora J wrote:  Reason for CRM: Pt would like to schedule his colonoscopy, advised pt someone in the office would give him a callback to get him scheduled  Please advise. Can referral be placed?

## 2023-08-02 NOTE — Telephone Encounter (Signed)
 I have referred him for an office visit re colonoscopy per most recent GI communication pls let him know

## 2023-08-04 ENCOUNTER — Encounter (INDEPENDENT_AMBULATORY_CARE_PROVIDER_SITE_OTHER): Payer: Self-pay | Admitting: *Deleted

## 2023-08-18 ENCOUNTER — Other Ambulatory Visit: Payer: Self-pay | Admitting: Family Medicine

## 2023-08-31 ENCOUNTER — Encounter: Payer: 59 | Admitting: Family Medicine

## 2023-09-06 ENCOUNTER — Ambulatory Visit: Payer: Medicaid Other

## 2023-09-14 ENCOUNTER — Encounter: Payer: Self-pay | Admitting: Gastroenterology

## 2023-09-14 ENCOUNTER — Ambulatory Visit (INDEPENDENT_AMBULATORY_CARE_PROVIDER_SITE_OTHER): Payer: Medicare HMO | Admitting: Gastroenterology

## 2023-09-14 ENCOUNTER — Telehealth: Payer: Self-pay | Admitting: Gastroenterology

## 2023-09-14 ENCOUNTER — Telehealth: Payer: Self-pay | Admitting: *Deleted

## 2023-09-14 VITALS — BP 147/93 | HR 59 | Temp 97.3°F | Ht 72.0 in | Wt 181.8 lb

## 2023-09-14 DIAGNOSIS — F1420 Cocaine dependence, uncomplicated: Secondary | ICD-10-CM

## 2023-09-14 DIAGNOSIS — F1411 Cocaine abuse, in remission: Secondary | ICD-10-CM | POA: Diagnosis not present

## 2023-09-14 DIAGNOSIS — D649 Anemia, unspecified: Secondary | ICD-10-CM

## 2023-09-14 DIAGNOSIS — Z860101 Personal history of adenomatous and serrated colon polyps: Secondary | ICD-10-CM

## 2023-09-14 NOTE — Telephone Encounter (Signed)
 Please ask patient to have labs to evaluate his chronic anemia as it may change plan. If iron deficiency he will also need upper endoscopy at time of colonoscopy.   CBC, iron/tibc/ferritin, b12, folate.

## 2023-09-14 NOTE — Telephone Encounter (Signed)
 LMTRC  TCS w/Dr.Carver, asa 3 Bisacodyl 10 mg daily x 3 days before prep Needs urine drug screen with pre-op 1.5 days of clear liquids No miralax prep
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
## 2023-09-14 NOTE — Patient Instructions (Signed)
 Colonoscopy to be scheduled. Separate instructions to be provided.

## 2023-09-15 NOTE — Telephone Encounter (Signed)
 Lmom for pt to return call

## 2023-09-16 NOTE — Telephone Encounter (Signed)
 Lmom for pt to return call

## 2023-09-20 NOTE — Telephone Encounter (Signed)
 Lm on house and mobile number for return call. Mailed pt a letter requesting a return call as well.

## 2023-09-21 NOTE — Telephone Encounter (Signed)
 LMOVM to return call.

## 2023-09-23 DIAGNOSIS — F1420 Cocaine dependence, uncomplicated: Secondary | ICD-10-CM | POA: Diagnosis not present

## 2023-09-30 ENCOUNTER — Encounter: Payer: Self-pay | Admitting: *Deleted

## 2023-09-30 DIAGNOSIS — F1420 Cocaine dependence, uncomplicated: Secondary | ICD-10-CM | POA: Diagnosis not present

## 2023-10-02 ENCOUNTER — Other Ambulatory Visit: Payer: Self-pay | Admitting: Family Medicine

## 2023-10-04 DIAGNOSIS — H2512 Age-related nuclear cataract, left eye: Secondary | ICD-10-CM | POA: Diagnosis not present

## 2023-10-04 DIAGNOSIS — H33321 Round hole, right eye: Secondary | ICD-10-CM | POA: Diagnosis not present

## 2023-10-04 DIAGNOSIS — H40033 Anatomical narrow angle, bilateral: Secondary | ICD-10-CM | POA: Diagnosis not present

## 2023-10-07 DIAGNOSIS — F1420 Cocaine dependence, uncomplicated: Secondary | ICD-10-CM | POA: Diagnosis not present

## 2023-10-14 DIAGNOSIS — F1420 Cocaine dependence, uncomplicated: Secondary | ICD-10-CM | POA: Diagnosis not present

## 2023-10-21 DIAGNOSIS — F1420 Cocaine dependence, uncomplicated: Secondary | ICD-10-CM | POA: Diagnosis not present

## 2023-10-28 DIAGNOSIS — F1420 Cocaine dependence, uncomplicated: Secondary | ICD-10-CM | POA: Diagnosis not present

## 2023-11-04 DIAGNOSIS — F1420 Cocaine dependence, uncomplicated: Secondary | ICD-10-CM | POA: Diagnosis not present

## 2023-11-09 DIAGNOSIS — H2512 Age-related nuclear cataract, left eye: Secondary | ICD-10-CM | POA: Diagnosis not present

## 2023-11-09 DIAGNOSIS — H25812 Combined forms of age-related cataract, left eye: Secondary | ICD-10-CM | POA: Diagnosis not present

## 2023-11-11 DIAGNOSIS — F1420 Cocaine dependence, uncomplicated: Secondary | ICD-10-CM | POA: Diagnosis not present

## 2023-12-13 ENCOUNTER — Other Ambulatory Visit: Payer: Self-pay | Admitting: Family Medicine

## 2023-12-30 DIAGNOSIS — H2511 Age-related nuclear cataract, right eye: Secondary | ICD-10-CM | POA: Diagnosis not present

## 2024-01-04 DIAGNOSIS — H25811 Combined forms of age-related cataract, right eye: Secondary | ICD-10-CM | POA: Diagnosis not present

## 2024-02-15 ENCOUNTER — Other Ambulatory Visit: Payer: Self-pay | Admitting: Family Medicine

## 2024-02-21 ENCOUNTER — Other Ambulatory Visit: Payer: Self-pay | Admitting: Family Medicine

## 2024-02-21 NOTE — Telephone Encounter (Signed)
 Copied from CRM [id number]. Topic: Clinical - Medication Refill >> Feb 21, 2024  9:12 AM Valerie G wrote: Medication:  amLODipine -olmesartan  (AZOR ) 10-40 MG tablet Has the patient contacted their pharmacy? Yes (Agent: If no, request that the patient contact the pharmacy for the refill. If patient does not wish to contact the pharmacy document the reason why and proceed with request.) (Agent: If yes, when and what did the pharmacy advise?)  This is the patient's preferred pharmacy:  [id number] - [place], [place] - [address] AT [facility] [address] [place] [address] Phone: [phone] Fax: [phone]  Is this the correct pharmacy for this prescription? Yes If no, delete pharmacy and type the correct one.   Has the prescription been filled recently? Yes  Is the patient out of the medication? Yes  Has the patient been seen for an appointment in the last year OR does the patient have an upcoming appointment? Yes  Can we respond through MyChart? No  Agent: Please be advised that Rx refills may take up to 3 business days. We ask that you follow-up with your pharmacy.

## 2024-02-23 ENCOUNTER — Other Ambulatory Visit: Payer: Self-pay | Admitting: Family Medicine

## 2024-02-25 ENCOUNTER — Telehealth: Payer: Self-pay | Admitting: Family Medicine

## 2024-02-25 MED ORDER — AMLODIPINE-OLMESARTAN 10-40 MG PO TABS: 1.0000 | ORAL_TABLET | Freq: Every day | ORAL | 0 refills | Status: DC

## 2024-02-25 NOTE — Telephone Encounter (Signed)
 Answered a call from [facility] in reference to a refill for this patient for his Amlodopine-Olmesartan  (Azor ) 10-40 mg. Tiffany from [facility] stated she saw a prescription in [facility] however it was not signed by MD and sent to pharmacy. Please RX to [id number] [place] [address] at [facility].

## 2024-02-27 ENCOUNTER — Other Ambulatory Visit: Payer: Self-pay | Admitting: Family Medicine

## 2024-03-03 ENCOUNTER — Ambulatory Visit (INDEPENDENT_AMBULATORY_CARE_PROVIDER_SITE_OTHER): Admitting: Family Medicine

## 2024-03-03 ENCOUNTER — Encounter: Payer: Self-pay | Admitting: Family Medicine

## 2024-03-03 VITALS — BP 136/78 | HR 71 | Resp 16 | Ht 72.0 in | Wt 181.0 lb

## 2024-03-03 DIAGNOSIS — E559 Vitamin D deficiency, unspecified: Secondary | ICD-10-CM

## 2024-03-03 DIAGNOSIS — Z13220 Encounter for screening for lipoid disorders: Secondary | ICD-10-CM | POA: Diagnosis not present

## 2024-03-03 DIAGNOSIS — I10 Essential (primary) hypertension: Secondary | ICD-10-CM | POA: Diagnosis not present

## 2024-03-03 DIAGNOSIS — Z0000 Encounter for general adult medical examination without abnormal findings: Secondary | ICD-10-CM

## 2024-03-03 DIAGNOSIS — Z125 Encounter for screening for malignant neoplasm of prostate: Secondary | ICD-10-CM

## 2024-03-03 MED ORDER — CLONIDINE HCL 0.1 MG PO TABS: 0.1000 mg | ORAL_TABLET | Freq: Every day | ORAL | 1 refills | Status: DC

## 2024-03-03 MED ORDER — FLUTICASONE FUROATE-VILANTEROL 200-25 MCG/ACT IN AEPB
1.0000 | INHALATION_SPRAY | Freq: Every day | RESPIRATORY_TRACT | 11 refills | Status: AC
Start: 2024-03-03 — End: ?

## 2024-03-03 MED ORDER — AMLODIPINE-OLMESARTAN 10-40 MG PO TABS: 1.0000 | ORAL_TABLET | Freq: Every day | ORAL | 1 refills | Status: AC

## 2024-03-03 NOTE — Patient Instructions (Signed)
 F/u in 6 months  STOP clonidine  , on;y azor  for b;ood pressure  Need flu and covid vaccines  Fasting lipid, cmp and EGFR, CBC, tSH and PSA and vit d 2nd week in Novemebr  Continue to Work on addiction problem  Thanks for choosing [facility], we consider it a privelige to serve you.

## 2024-03-03 NOTE — Assessment & Plan Note (Addendum)
 Annual exam as documented. Counseling done  re healthy lifestyle involving commitment to 150 minutes exercise per week, heart healthy diet, and attaining healthy weight.The importance of adequate sleep also discussed.  Immunization and cancer screening needs are specifically addressed at this visit.

## 2024-03-03 NOTE — Progress Notes (Signed)
   Marc Ward     MRN: [id number]      DOB: [birth year]  Chief Complaint  Patient presents with   Annual Exam    Cpe. Wanting refill of breo     HPI: Patient is in for annual physical exam. . Immunization is reviewed , and  updated if needed.    PE; BP 136/78   Pulse 71   Resp 16   Ht 6' (1.829 m)   Wt 181 lb (82.1 kg)   SpO2 93%   BMI 24.55 kg/m   Pleasant male, alert and oriented x 3, in no cardio-pulmonary distress. Afebrile. HEENT No facial trauma or asymetry. Sinuses non tender. EOMI External ears normal,  Neck: supple, no adenopathy,JVD or thyromegaly.No bruits.  Chest: Clear to ascultation bilaterally.No crackles or wheezes. Non tender to palpation  Cardiovascular system; Heart sounds normal,  S1 and  S2 ,no S3.  No murmur, or thrill. Apical beat not displaced Peripheral pulses normal.  Abdomen: Soft, non tender, no organomegaly or masses. No bruits. Bowel sounds normal. No guarding, tenderness or rebound.    Musculoskeletal exam: Full ROM of spine, hips , shoulders and knees. No deformity ,swelling or crepitus noted. No muscle wasting or atrophy.   Neurologic: Cranial nerves 2 to 12 intact. Power, tone ,sensation and reflexes normal throughout. No disturbance in gait. No tremor.  Skin: Intact, no ulceration, erythema , scaling or rash noted. Pigmentation normal throughout  Psych; Normal mood and affect. Judgement and concentration normal   Assessment & Plan:  Encounter for annual physical exam Annual exam as documented. Counseling done  re healthy lifestyle involving commitment to 150 minutes exercise per week, heart healthy diet, and attaining healthy weight.The importance of adequate sleep also discussed. Immunization and cancer screening needs are specifically addressed at this visit.

## 2024-03-04 ENCOUNTER — Encounter: Payer: Self-pay | Admitting: Family Medicine

## 2024-04-20 DIAGNOSIS — J00 Acute nasopharyngitis [common cold]: Secondary | ICD-10-CM | POA: Diagnosis not present

## 2024-05-26 ENCOUNTER — Ambulatory Visit: Payer: Self-pay | Admitting: Family Medicine

## 2024-05-26 LAB — VITAMIN D 25 HYDROXY (VIT D DEFICIENCY, FRACTURES): Vit D, 25-Hydroxy: 36.4 ng/mL (ref 30.0–100.0)

## 2024-05-26 LAB — CBC WITH DIFFERENTIAL/PLATELET
Basophils Absolute: 0 x10E3/uL (ref 0.0–0.2)
Basos: 1 %
EOS (ABSOLUTE): 0.1 x10E3/uL (ref 0.0–0.4)
Eos: 2 %
Hematocrit: 38.1 % (ref 37.5–51.0)
Hemoglobin: 12.3 g/dL — ABNORMAL LOW (ref 13.0–17.7)
Immature Grans (Abs): 0 x10E3/uL (ref 0.0–0.1)
Immature Granulocytes: 0 %
Lymphocytes Absolute: 2.1 x10E3/uL (ref 0.7–3.1)
Lymphs: 34 %
MCH: 32.3 pg (ref 26.6–33.0)
MCHC: 32.3 g/dL (ref 31.5–35.7)
MCV: 100 fL — ABNORMAL HIGH (ref 79–97)
Monocytes Absolute: 0.4 x10E3/uL (ref 0.1–0.9)
Monocytes: 7 %
Neutrophils Absolute: 3.5 x10E3/uL (ref 1.4–7.0)
Neutrophils: 56 %
Platelets: 151 x10E3/uL (ref 150–450)
RBC: 3.81 x10E6/uL — ABNORMAL LOW (ref 4.14–5.80)
RDW: 13 % (ref 11.6–15.4)
WBC: 6.2 x10E3/uL (ref 3.4–10.8)

## 2024-05-26 LAB — LIPID PANEL
Chol/HDL Ratio: 3.1 ratio (ref 0.0–5.0)
Cholesterol, Total: 132 mg/dL (ref 100–199)
HDL: 42 mg/dL (ref >39)
LDL Chol Calc (NIH): 71 mg/dL (ref 0–99)
Triglycerides: 99 mg/dL (ref 0–149)
VLDL Cholesterol Cal: 19 mg/dL (ref 5–40)

## 2024-05-26 LAB — PSA: Prostate Specific Ag, Serum: 1.3 ng/mL (ref 0.0–4.0)

## 2024-05-26 LAB — CMP14+EGFR
ALT: 11 IU/L (ref 0–44)
AST: 14 IU/L (ref 0–40)
Albumin: 4.1 g/dL (ref 3.8–4.8)
Alkaline Phosphatase: 62 IU/L (ref 47–123)
BUN/Creatinine Ratio: 11 (ref 10–24)
BUN: 14 mg/dL (ref 8–27)
Bilirubin Total: 0.9 mg/dL (ref 0.0–1.2)
CO2: 22 mmol/L (ref 20–29)
Calcium: 9.1 mg/dL (ref 8.6–10.2)
Chloride: 107 mmol/L — ABNORMAL HIGH (ref 96–106)
Creatinine, Ser: 1.29 mg/dL — ABNORMAL HIGH (ref 0.76–1.27)
Globulin, Total: 2.6 g/dL (ref 1.5–4.5)
Glucose: 98 mg/dL (ref 70–99)
Potassium: 3.7 mmol/L (ref 3.5–5.2)
Sodium: 144 mmol/L (ref 134–144)
Total Protein: 6.7 g/dL (ref 6.0–8.5)
eGFR: 58 mL/min/1.73 — ABNORMAL LOW (ref >59)

## 2024-05-26 LAB — TSH: TSH: 2.88 u[IU]/mL (ref 0.450–4.500)

## 2024-05-31 ENCOUNTER — Other Ambulatory Visit: Payer: Self-pay

## 2024-05-31 ENCOUNTER — Emergency Department (HOSPITAL_COMMUNITY)
Admission: EM | Admit: 2024-05-31 | Discharge: 2024-05-31 | Disposition: A | Discharge status: Home / Self Care | Attending: Emergency Medicine | Admitting: Emergency Medicine

## 2024-05-31 DIAGNOSIS — Z79899 Other long term (current) drug therapy: Secondary | ICD-10-CM | POA: Diagnosis not present

## 2024-05-31 DIAGNOSIS — I10 Essential (primary) hypertension: Secondary | ICD-10-CM | POA: Insufficient documentation

## 2024-05-31 DIAGNOSIS — J449 Chronic obstructive pulmonary disease, unspecified: Secondary | ICD-10-CM | POA: Diagnosis not present

## 2024-05-31 DIAGNOSIS — Z7982 Long term (current) use of aspirin: Secondary | ICD-10-CM | POA: Insufficient documentation

## 2024-05-31 DIAGNOSIS — Z0489 Encounter for examination and observation for other specified reasons: Secondary | ICD-10-CM | POA: Diagnosis present

## 2024-05-31 LAB — I-STAT CHEM 8, ED
BUN: 16 mg/dL (ref 8–23)
Calcium, Ion: 1.11 mmol/L — ABNORMAL LOW (ref 1.15–1.40)
Chloride: 110 mmol/L (ref 98–111)
Creatinine, Ser: 1.2 mg/dL (ref 0.61–1.24)
Glucose, Bld: 99 mg/dL (ref 70–99)
HCT: 35 % — ABNORMAL LOW (ref 39.0–52.0)
Hemoglobin: 11.9 g/dL — ABNORMAL LOW (ref 13.0–17.0)
Potassium: 3.8 mmol/L (ref 3.5–5.1)
Sodium: 146 mmol/L — ABNORMAL HIGH (ref 135–145)
TCO2: 25 mmol/L (ref 22–32)

## 2024-05-31 NOTE — ED Triage Notes (Signed)
 Pt brb EMS from [facility], pt states nurse called EMS as precaution because he might have stood up too fast. Pt has no complaints at this time. Hx of hypertension. A&Ox4
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
## 2024-05-31 NOTE — Discharge Instructions (Addendum)
 Thank you for coming to [facility] Emergency Department. We did an exam, labs, and these showed no acute findings. Please follow up with your primary care provider within 1 week for management of your high blood pressure.  Do not hesitate to return to the ED or call 911 if you experience: -Worsening symptoms -Lightheadedness, passing out -Fevers/chills -Anything else that concerns you

## 2024-06-20 ENCOUNTER — Ambulatory Visit

## 2024-06-20 VITALS — Ht 72.0 in | Wt 185.0 lb

## 2024-06-20 DIAGNOSIS — Z0000 Encounter for general adult medical examination without abnormal findings: Secondary | ICD-10-CM | POA: Diagnosis not present

## 2024-06-20 DIAGNOSIS — Z1211 Encounter for screening for malignant neoplasm of colon: Secondary | ICD-10-CM

## 2024-06-20 NOTE — Patient Instructions (Signed)
 Marc Ward,  Thank you for taking the time for your Medicare Wellness Visit. I appreciate your continued commitment to your health goals. Please review the care plan we discussed, and feel free to reach out if I can assist you further.  Please note that Annual Wellness Visits do not include a physical exam. Some assessments may be limited, especially if the visit was conducted virtually. If needed, we may recommend an in-person follow-up with your provider.  Ongoing Care Seeing your primary care provider every 3 to 6 months helps us  monitor your health and provide consistent, personalized care.   1 year follow up: June 22, 2025 at 11:20am office.   Referrals If a referral was made during today's visit and you haven't received any updates within two weeks, please contact the referred provider directly to check on the status.  [facility] at  [address] Phone: [phone]  Recommended Screenings:  Health Maintenance  Topic Date Due   Colon Cancer Screening  04/10/2023   COVID-19 Vaccine (6 - 2025-26 season) 03/20/2024   Medicare Annual Wellness Visit  06/20/2025   DTaP/Tdap/Td vaccine (3 - Td or Tdap) 07/18/2030   Pneumococcal Vaccine for age over 80  Completed   Flu Shot  Completed   Hepatitis C Screening  Completed   Zoster (Shingles) Vaccine  Completed   Meningitis B Vaccine  Aged Out       06/20/2024    2:27 PM  Advanced Directives  Does Patient Have a Medical Advance Directive? No  Would patient like information on creating a medical advance directive? No - Patient declined    Vision: Annual vision screenings are recommended for early detection of glaucoma, cataracts, and diabetic retinopathy. These exams can also reveal signs of chronic conditions such as diabetes and high blood pressure.  Dental: Annual dental screenings help detect early signs of oral cancer, gum disease, and other conditions linked to overall  health, including heart disease and diabetes.  Please see the attached documents for additional preventive care recommendations.
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
## 2024-07-24 ENCOUNTER — Encounter: Payer: Self-pay | Admitting: Internal Medicine

## 2024-08-07 ENCOUNTER — Encounter: Payer: Self-pay | Admitting: Gastroenterology

## 2024-09-04 ENCOUNTER — Ambulatory Visit: Admitting: Gastroenterology

## 2024-09-05 ENCOUNTER — Ambulatory Visit: Admitting: Family Medicine

## 2025-06-22 ENCOUNTER — Ambulatory Visit
